# Patient Record
Sex: Female | Born: 1980
Health system: Southern US, Community
[De-identification: ages and names within clinical notes are randomized; demographics above are authoritative.]

## PROBLEM LIST (undated history)

## (undated) ENCOUNTER — Inpatient Hospital Stay (HOSPITAL_COMMUNITY): Payer: Self-pay

## (undated) DIAGNOSIS — O24419 Gestational diabetes mellitus in pregnancy, unspecified control: Secondary | ICD-10-CM

## (undated) DIAGNOSIS — Z803 Family history of malignant neoplasm of breast: Secondary | ICD-10-CM

## (undated) DIAGNOSIS — Z800 Family history of malignant neoplasm of digestive organs: Secondary | ICD-10-CM

## (undated) DIAGNOSIS — F32A Depression, unspecified: Secondary | ICD-10-CM

## (undated) DIAGNOSIS — Z808 Family history of malignant neoplasm of other organs or systems: Secondary | ICD-10-CM

## (undated) DIAGNOSIS — F419 Anxiety disorder, unspecified: Secondary | ICD-10-CM

## (undated) HISTORY — DX: Family history of malignant neoplasm of digestive organs: Z80.0

## (undated) HISTORY — PX: FOOT SURGERY: SHX648

## (undated) HISTORY — DX: Gestational diabetes mellitus in pregnancy, unspecified control: O24.419

## (undated) HISTORY — DX: Family history of malignant neoplasm of other organs or systems: Z80.8

## (undated) HISTORY — DX: Family history of malignant neoplasm of breast: Z80.3

---
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
## 2002-08-02 ENCOUNTER — Other Ambulatory Visit: Admission: RE | Admit: 2002-08-02 | Discharge: 2002-08-02 | Payer: Self-pay | Admitting: Family Medicine

## 2002-08-02 ENCOUNTER — Encounter: Admission: RE | Admit: 2002-08-02 | Discharge: 2002-08-02 | Payer: Self-pay | Admitting: Family Medicine

## 2002-08-02 ENCOUNTER — Encounter (INDEPENDENT_AMBULATORY_CARE_PROVIDER_SITE_OTHER): Payer: Self-pay

## 2002-09-03 ENCOUNTER — Encounter: Admission: RE | Admit: 2002-09-03 | Discharge: 2002-09-03 | Payer: Self-pay | Admitting: Obstetrics and Gynecology

## 2003-09-03 ENCOUNTER — Emergency Department (HOSPITAL_COMMUNITY): Admission: EM | Admit: 2003-09-03 | Discharge: 2003-09-03 | Payer: Self-pay | Admitting: Emergency Medicine

## 2004-03-14 DIAGNOSIS — Z87442 Personal history of urinary calculi: Secondary | ICD-10-CM

## 2004-03-14 HISTORY — DX: Personal history of urinary calculi: Z87.442

## 2006-07-05 ENCOUNTER — Other Ambulatory Visit: Admission: RE | Admit: 2006-07-05 | Discharge: 2006-07-05 | Payer: Self-pay | Admitting: Gynecology

## 2006-12-29 ENCOUNTER — Emergency Department (HOSPITAL_COMMUNITY): Admission: EM | Admit: 2006-12-29 | Discharge: 2006-12-29 | Payer: Self-pay | Admitting: Emergency Medicine

## 2008-03-19 ENCOUNTER — Emergency Department (HOSPITAL_COMMUNITY): Admission: EM | Admit: 2008-03-19 | Discharge: 2008-03-19 | Payer: Self-pay | Admitting: Emergency Medicine

## 2010-03-03 ENCOUNTER — Encounter (HOSPITAL_COMMUNITY)
Admission: RE | Admit: 2010-03-03 | Discharge: 2010-04-13 | Payer: Self-pay | Source: Home / Self Care | Attending: Podiatry | Admitting: Podiatry

## 2010-12-22 LAB — URINALYSIS, ROUTINE W REFLEX MICROSCOPIC
Bilirubin Urine: NEGATIVE
Glucose, UA: NEGATIVE
Ketones, ur: NEGATIVE
Nitrite: NEGATIVE
Protein, ur: NEGATIVE
Specific Gravity, Urine: 1.011
Urobilinogen, UA: 0.2
pH: 7.5

## 2010-12-22 LAB — URINE MICROSCOPIC-ADD ON

## 2010-12-22 LAB — PREGNANCY, URINE: Preg Test, Ur: NEGATIVE

## 2012-12-14 LAB — OB RESULTS CONSOLE ANTIBODY SCREEN: Antibody Screen: NEGATIVE

## 2012-12-14 LAB — OB RESULTS CONSOLE HEPATITIS B SURFACE ANTIGEN: Hepatitis B Surface Ag: NEGATIVE

## 2012-12-14 LAB — OB RESULTS CONSOLE HIV ANTIBODY (ROUTINE TESTING): HIV: NONREACTIVE

## 2012-12-14 LAB — OB RESULTS CONSOLE ABO/RH: RH Type: POSITIVE

## 2012-12-14 LAB — OB RESULTS CONSOLE RUBELLA ANTIBODY, IGM: Rubella: IMMUNE

## 2013-01-02 LAB — OB RESULTS CONSOLE GC/CHLAMYDIA
Chlamydia: NEGATIVE
Gonorrhea: NEGATIVE

## 2013-03-14 DIAGNOSIS — K219 Gastro-esophageal reflux disease without esophagitis: Secondary | ICD-10-CM

## 2013-03-14 HISTORY — DX: Gastro-esophageal reflux disease without esophagitis: K21.9

## 2013-03-14 NOTE — L&D Delivery Note (Signed)
Operative Delivery Note At 10:16 AM a viable and healthy female was delivered via Vaginal, Vacuum Neurosurgeon).  Presentation: vertex; Position: Right,, Occiput,, Anterior; Station: +3.  Verbal consent: obtained from family.  Risks and benefits discussed in detail.  Risks include, but are not limited to the risks of anesthesia, bleeding, infection, damage to maternal tissues, fetal cephalhematoma.  There is also the risk of inability to effect vaginal delivery of the head, or shoulder dystocia that cannot be resolved by established maneuvers, leading to the need for emergency cesarean section.  APGAR: 8, 9; weight pending Placenta status: Intact, Spontaneous.   Cord: 3 vessels with the following complications: None.  Cord pH: na  Anesthesia: Epidural  Instruments: kiwi x 3 pulls, 2 pop offs Episiotomy: None Lacerations: 2nd degree Suture Repair: 2.0 vicryl rapide Est. Blood Loss (mL): 300  Mom to postpartum.  Baby to Couplet care / Skin to Skin.  Amanda Burns J 06/08/2013, 10:31 AM

## 2013-03-28 LAB — OB RESULTS CONSOLE RPR: RPR: NONREACTIVE

## 2013-04-24 ENCOUNTER — Ambulatory Visit: Payer: Self-pay

## 2013-05-01 ENCOUNTER — Ambulatory Visit: Payer: Self-pay

## 2013-05-08 ENCOUNTER — Encounter: Payer: 59 | Attending: Obstetrics and Gynecology

## 2013-05-08 VITALS — Ht 61.0 in | Wt 180.0 lb

## 2013-05-08 DIAGNOSIS — O99810 Abnormal glucose complicating pregnancy: Secondary | ICD-10-CM | POA: Diagnosis present

## 2013-05-16 NOTE — Progress Notes (Addendum)
  Patient was seen on 05/08/13 for Gestational Diabetes self-management class at the [facility]. The following learning objectives were met by the patient during this course:   States the definition of Gestational Diabetes  States why dietary management is important in controlling blood glucose  Describes the effects of carbohydrates on blood glucose levels  Demonstrates ability to create a balanced meal plan  Demonstrates carbohydrate counting   States when to check blood glucose levels  Demonstrates proper blood glucose monitoring techniques  States the effect of stress and exercise on blood glucose levels  States the importance of limiting caffeine and abstaining from alcohol and smoking  Plan:  Aim for 2 Carb Choices per meal (30 grams) +/- 1 either way for breakfast Aim for 3 Carb Choices per meal (45 grams) +/- 1 either way from lunch and dinner Aim for 1-2 Carbs per snack Begin reading food labels for Total Carbohydrate and sugar grams of foods Consider  increasing your activity level by walking daily as tolerated Begin checking BG before breakfast and 1-2 hours after first bit of breakfast, lunch and dinner after  as directed by MD  Take medication  as directed by MD  Glucometer dispensed: Accu Ck Nano Lot: [id number] Exp: 04-13-14 Glucose: 36m/dl  Patient instructed to monitor glucose levels: FBS: 60 - <90 1 hour: <140 2 hour: <120  Patient received the following handouts:  Nutrition Diabetes and Pregnancy  Carbohydrate Counting List  Meal Planning worksheet  Patient will be seen for follow-up as needed.

## 2013-05-22 LAB — OB RESULTS CONSOLE GBS: GBS: NEGATIVE

## 2013-06-07 ENCOUNTER — Inpatient Hospital Stay (HOSPITAL_COMMUNITY): Payer: 59 | Admitting: Anesthesiology

## 2013-06-07 ENCOUNTER — Inpatient Hospital Stay (HOSPITAL_COMMUNITY)
Admission: AD | Admit: 2013-06-07 | Discharge: 2013-06-10 | DRG: 775 | Disposition: A | Discharge status: Home / Self Care | Payer: 59 | Source: Ambulatory Visit | Attending: Obstetrics and Gynecology | Admitting: Obstetrics and Gynecology

## 2013-06-07 ENCOUNTER — Encounter (HOSPITAL_COMMUNITY): Payer: 59 | Admitting: Anesthesiology

## 2013-06-07 ENCOUNTER — Encounter (HOSPITAL_COMMUNITY): Payer: Self-pay | Admitting: *Deleted

## 2013-06-07 DIAGNOSIS — K649 Unspecified hemorrhoids: Secondary | ICD-10-CM | POA: Diagnosis present

## 2013-06-07 DIAGNOSIS — Z3490 Encounter for supervision of normal pregnancy, unspecified, unspecified trimester: Secondary | ICD-10-CM

## 2013-06-07 DIAGNOSIS — O99810 Abnormal glucose complicating pregnancy: Secondary | ICD-10-CM | POA: Diagnosis present

## 2013-06-07 DIAGNOSIS — O99814 Abnormal glucose complicating childbirth: Secondary | ICD-10-CM | POA: Diagnosis present

## 2013-06-07 DIAGNOSIS — O878 Other venous complications in the puerperium: Secondary | ICD-10-CM | POA: Diagnosis present

## 2013-06-07 LAB — CBC
HCT: 33.3 % — ABNORMAL LOW (ref 36.0–46.0)
Hemoglobin: 11.4 g/dL — ABNORMAL LOW (ref 12.0–15.0)
MCH: 28.7 pg (ref 26.0–34.0)
MCHC: 34.2 g/dL (ref 30.0–36.0)
MCV: 83.9 fL (ref 78.0–100.0)
Platelets: 344 10*3/uL (ref 150–400)
RBC: 3.97 MIL/uL (ref 3.87–5.11)
RDW: 13.9 % (ref 11.5–15.5)
WBC: 17.2 10*3/uL — ABNORMAL HIGH (ref 4.0–10.5)

## 2013-06-07 LAB — TYPE AND SCREEN
ABO/RH(D): A POS
Antibody Screen: NEGATIVE

## 2013-06-07 LAB — GLUCOSE, CAPILLARY: Glucose-Capillary: 96 mg/dL (ref 70–99)

## 2013-06-07 MED ORDER — ONDANSETRON HCL 4 MG/2ML IJ SOLN: 4.0000 mg | Freq: Four times a day (QID) | INTRAMUSCULAR | Status: DC | PRN

## 2013-06-07 MED ORDER — LACTATED RINGERS IV SOLN
INTRAVENOUS | Status: DC
  Administered 2013-06-07: 22:00:00 via INTRAVENOUS

## 2013-06-07 MED ORDER — LIDOCAINE HCL (PF) 1 % IJ SOLN
30.0000 mL | INTRAMUSCULAR | Status: AC | PRN
  Administered 2013-06-08: 30 mL via SUBCUTANEOUS
  Filled 2013-06-07: qty 30

## 2013-06-07 MED ORDER — PHENYLEPHRINE 40 MCG/ML (10ML) SYRINGE FOR IV PUSH (FOR BLOOD PRESSURE SUPPORT)
PREFILLED_SYRINGE | INTRAVENOUS | Status: AC
  Filled 2013-06-07: qty 10

## 2013-06-07 MED ORDER — OXYCODONE-ACETAMINOPHEN 5-325 MG PO TABS: 1.0000 | ORAL_TABLET | ORAL | Status: DC | PRN

## 2013-06-07 MED ORDER — PHENYLEPHRINE 40 MCG/ML (10ML) SYRINGE FOR IV PUSH (FOR BLOOD PRESSURE SUPPORT)
80.0000 ug | PREFILLED_SYRINGE | INTRAVENOUS | Status: DC | PRN
  Administered 2013-06-07: 80 ug via INTRAVENOUS
  Filled 2013-06-07: qty 2

## 2013-06-07 MED ORDER — FENTANYL 2.5 MCG/ML BUPIVACAINE 1/10 % EPIDURAL INFUSION (WH - ANES)
14.0000 mL/h | INTRAMUSCULAR | Status: DC | PRN
  Administered 2013-06-08 (×2): 14 mL/h via EPIDURAL
  Filled 2013-06-07 (×2): qty 125

## 2013-06-07 MED ORDER — IBUPROFEN 600 MG PO TABS: 600.0000 mg | ORAL_TABLET | Freq: Four times a day (QID) | ORAL | Status: DC | PRN

## 2013-06-07 MED ORDER — ACETAMINOPHEN 325 MG PO TABS: 650.0000 mg | ORAL_TABLET | ORAL | Status: DC | PRN

## 2013-06-07 MED ORDER — FENTANYL 2.5 MCG/ML BUPIVACAINE 1/10 % EPIDURAL INFUSION (WH - ANES)
INTRAMUSCULAR | Status: AC
  Filled 2013-06-07: qty 125

## 2013-06-07 MED ORDER — OXYTOCIN 40 UNITS IN LACTATED RINGERS INFUSION - SIMPLE MED: 62.5000 mL/h | INTRAVENOUS | Status: DC

## 2013-06-07 MED ORDER — FLEET ENEMA 7-19 GM/118ML RE ENEM: 1.0000 | ENEMA | Freq: Once | RECTAL | Status: DC

## 2013-06-07 MED ORDER — EPHEDRINE 5 MG/ML INJ
10.0000 mg | INTRAVENOUS | Status: DC | PRN
  Filled 2013-06-07: qty 2

## 2013-06-07 MED ORDER — LIDOCAINE HCL (PF) 1 % IJ SOLN
INTRAMUSCULAR | Status: DC | PRN
  Administered 2013-06-07 (×2): 4 mL

## 2013-06-07 MED ORDER — OXYTOCIN BOLUS FROM INFUSION: 500.0000 mL | INTRAVENOUS | Status: DC

## 2013-06-07 MED ORDER — FENTANYL 2.5 MCG/ML BUPIVACAINE 1/10 % EPIDURAL INFUSION (WH - ANES)
INTRAMUSCULAR | Status: DC | PRN
  Administered 2013-06-07: 14 mL/h via EPIDURAL

## 2013-06-07 MED ORDER — DIPHENHYDRAMINE HCL 50 MG/ML IJ SOLN
12.5000 mg | INTRAMUSCULAR | Status: AC | PRN
  Administered 2013-06-08 (×3): 12.5 mg via INTRAVENOUS
  Filled 2013-06-07 (×2): qty 1

## 2013-06-07 MED ORDER — EPHEDRINE 5 MG/ML INJ
INTRAVENOUS | Status: AC
  Filled 2013-06-07: qty 4

## 2013-06-07 MED ORDER — LACTATED RINGERS IV SOLN: 500.0000 mL | Freq: Once | INTRAVENOUS | Status: DC

## 2013-06-07 MED ORDER — LACTATED RINGERS IV SOLN
500.0000 mL | INTRAVENOUS | Status: DC | PRN
Start: 2013-06-07 — End: 2013-06-08
  Administered 2013-06-08: 500 mL via INTRAVENOUS

## 2013-06-07 MED ORDER — CITRIC ACID-SODIUM CITRATE 334-500 MG/5ML PO SOLN: 30.0000 mL | ORAL | Status: DC | PRN

## 2013-06-07 NOTE — Anesthesia Procedure Notes (Signed)
Epidural Patient location during procedure: OB Start time: 06/07/2013 6:41 PM  Staffing Anesthesiologist: Jatasia Gundrum A. Performed by: anesthesiologist   Preanesthetic Checklist Completed: patient identified, site marked, surgical consent, pre-op evaluation, timeout performed, IV checked, risks and benefits discussed and monitors and equipment checked  Epidural Patient position: sitting Prep: site prepped and draped and DuraPrep Patient monitoring: continuous pulse ox and blood pressure Approach: midline Location: L3-L4 Injection technique: LOR air  Needle:  Needle type: Tuohy  Needle gauge: 17 G Needle length: 9 cm and 9 Needle insertion depth: 5 cm cm Catheter type: closed end flexible Catheter size: 19 Gauge Catheter at skin depth: 10 cm Test dose: negative and Other  Assessment Events: blood not aspirated, injection not painful, no injection resistance, negative IV test and no paresthesia  Additional Notes Patient identified. Risks and benefits discussed including failed block, incomplete  Pain control, post dural puncture headache, nerve damage, paralysis, blood pressure Changes, nausea, vomiting, reactions to medications-both toxic and allergic and post Partum back pain. All questions were answered. Patient expressed understanding and wished to proceed. Sterile technique was used throughout procedure. Epidural site was Dressed with sterile barrier dressing. No paresthesias, signs of intravascular injection Or signs of intrathecal spread were encountered.  Patient was more comfortable after the epidural was dosed. Please see RN's note for documentation of vital signs and FHR which are stable.

## 2013-06-07 NOTE — H&P (Signed)
Amanda Burns is a 33 y.o. female presenting for SROM at term. Maternal Medical History:  Reason for admission: Rupture of membranes.   Contractions: Onset was less than 1 hour ago.   Frequency: regular.   Perceived severity is moderate.    Fetal activity: Perceived fetal activity is normal.   Last perceived fetal movement was within the past hour.    Prenatal complications: no prenatal complications Prenatal Complications - Diabetes: gestational. Diabetes is managed by diet.      OB History   Grav Para Term Preterm Abortions TAB SAB Ect Mult Living   1              Past Medical History  Diagnosis Date  . Gestational diabetes mellitus, currently pregnant    Past Surgical History  Procedure Laterality Date  . Foot surgery     Family History: family history includes Cancer in her other. Social History:  reports that she has never smoked. She does not have any smokeless tobacco history on file. Her alcohol and drug histories are not on file.   Prenatal Transfer Tool  Maternal Diabetes: Yes:  Diabetes Type:  Diet controlled Genetic Screening: Normal Maternal Ultrasounds/Referrals: Normal Fetal Ultrasounds or other Referrals:  None Maternal Substance Abuse:  No Significant Maternal Medications:  None Significant Maternal Lab Results:  None Other Comments:  None  Review of Systems  Constitutional: Negative.   Eyes: Negative.   Cardiovascular: Negative.   Gastrointestinal: Negative.   Genitourinary: Negative.   Neurological: Negative.   Endo/Heme/Allergies: Negative.   Psychiatric/Behavioral: Negative.       Blood pressure 136/80, pulse 97, temperature 98 F (36.7 C), temperature source Oral, resp. rate 20, height 5\' 1"  (1.549 m), weight 85.276 kg (188 lb). Maternal Exam:  Uterine Assessment: Contraction strength is moderate.  Contraction frequency is regular.   Abdomen: Patient reports no abdominal tenderness. Fetal presentation: vertex  Introitus: Normal vulva.  Normal vagina.  Ferning test: positive.  Nitrazine test: positive. Amniotic fluid character: clear.  Pelvis: adequate for delivery.   Cervix: Cervix evaluated by digital exam.     Physical Exam  Constitutional: She is oriented to person, place, and time. She appears well-developed and well-nourished.  HENT:  Head: Normocephalic and atraumatic.  Eyes: Pupils are equal, round, and reactive to light.  Cardiovascular: Normal rate and regular rhythm.   Respiratory: Effort normal and breath sounds normal.  GI: Soft.  Genitourinary: Vagina normal.  Musculoskeletal: Normal range of motion.  Neurological: She is alert and oriented to person, place, and time.    Prenatal labs: ABO, Rh:   Antibody:   Rubella:   RPR:    HBsAg:    HIV:    GBS:     Assessment/Plan: SROM at term  GDM- diet controlled Admit  Epidural prn   Naseem Adler J 06/07/2013, 5:45 PM

## 2013-06-07 NOTE — Anesthesia Preprocedure Evaluation (Signed)
Anesthesia Evaluation  Patient identified by MRN, date of birth, ID band Patient awake    Reviewed: Allergy & Precautions, H&P , Patient's Chart, lab work & pertinent test results  Airway Mallampati: III TM Distance: >3 FB Neck ROM: Full    Dental no notable dental hx. (+) Teeth Intact   Pulmonary neg pulmonary ROS,  breath sounds clear to auscultation  Pulmonary exam normal       Cardiovascular negative cardio ROS  Rhythm:Regular Rate:Normal     Neuro/Psych negative neurological ROS  negative psych ROS   GI/Hepatic Neg liver ROS, GERD-  Medicated and Controlled,  Endo/Other  diabetes, Well Controlled, GestationalObesity  Renal/GU negative Renal ROS  negative genitourinary   Musculoskeletal negative musculoskeletal ROS (+)   Abdominal (+) + obese,   Peds  Hematology negative hematology ROS (+)   Anesthesia Other Findings   Reproductive/Obstetrics (+) Pregnancy                           Anesthesia Physical Anesthesia Plan  ASA: II  Anesthesia Plan: Epidural   Post-op Pain Management:    Induction:   Airway Management Planned: Natural Airway  Additional Equipment:   Intra-op Plan:   Post-operative Plan:   Informed Consent: I have reviewed the patients History and Physical, chart, labs and discussed the procedure including the risks, benefits and alternatives for the proposed anesthesia with the patient or authorized representative who has indicated his/her understanding and acceptance.     Plan Discussed with: Anesthesiologist  Anesthesia Plan Comments:         Anesthesia Quick Evaluation

## 2013-06-07 NOTE — Progress Notes (Signed)
Amanda Burns is a 33 y.o. G1P0 at [redacted]w[redacted]d by LMP admitted for rupture of membranes  Subjective: comfortable  Objective: BP 118/75  Pulse 87  Temp(Src) 98 F (36.7 C) (Oral)  Resp 18  Ht 5\' 1"  (1.549 m)  Wt 85.276 kg (188 lb)  BMI 35.54 kg/m2  SpO2 100%  LMP 09/19/2012      FHT:  FHR: 150 bpm, variability: moderate,  accelerations:  Present,  decelerations:  Absent UC:   regular, every 3 minutes SVE:   Dilation: 1 Effacement (%): 100 Station: -1;-2 Exam by:: S Grindstaff RN  Labs: Lab Results  Component Value Date   WBC 17.2* 06/07/2013   HGB 11.4* 06/07/2013   HCT 33.3* 06/07/2013   MCV 83.9 06/07/2013   PLT 344 06/07/2013    Assessment / Plan: Spontaneous labor, progressing normally Latent phase GDM  Labor: Progressing normally Preeclampsia:  no signs or symptoms of toxicity and intake and ouput balanced Fetal Wellbeing:  Category I Pain Control:  Epidural I/D:  n/a Anticipated MOD:  NSVD  Amanda Burns J 06/07/2013, 8:29 PM

## 2013-06-08 ENCOUNTER — Encounter (HOSPITAL_COMMUNITY): Payer: Self-pay | Admitting: *Deleted

## 2013-06-08 DIAGNOSIS — O99810 Abnormal glucose complicating pregnancy: Secondary | ICD-10-CM | POA: Diagnosis not present

## 2013-06-08 LAB — ABO/RH: ABO/RH(D): A POS

## 2013-06-08 LAB — GLUCOSE, CAPILLARY
Glucose-Capillary: 117 mg/dL — ABNORMAL HIGH (ref 70–99)
Glucose-Capillary: 97 mg/dL (ref 70–99)

## 2013-06-08 LAB — RPR: RPR Ser Ql: NONREACTIVE

## 2013-06-08 MED ORDER — BENZOCAINE-MENTHOL 20-0.5 % EX AERO
1.0000 "application " | INHALATION_SPRAY | CUTANEOUS | Status: DC | PRN
  Administered 2013-06-08: 1 via TOPICAL
  Filled 2013-06-08: qty 56

## 2013-06-08 MED ORDER — DIBUCAINE 1 % RE OINT
1.0000 "application " | TOPICAL_OINTMENT | RECTAL | Status: DC | PRN
  Administered 2013-06-09: 1 via RECTAL
  Filled 2013-06-08: qty 28

## 2013-06-08 MED ORDER — OXYTOCIN 40 UNITS IN LACTATED RINGERS INFUSION - SIMPLE MED
1.0000 m[IU]/min | INTRAVENOUS | Status: DC
  Administered 2013-06-08: 1 m[IU]/min via INTRAVENOUS
  Filled 2013-06-08: qty 1000

## 2013-06-08 MED ORDER — LANOLIN HYDROUS EX OINT: TOPICAL_OINTMENT | CUTANEOUS | Status: DC | PRN

## 2013-06-08 MED ORDER — METHYLERGONOVINE MALEATE 0.2 MG/ML IJ SOLN: 0.2000 mg | INTRAMUSCULAR | Status: DC | PRN

## 2013-06-08 MED ORDER — TETANUS-DIPHTH-ACELL PERTUSSIS 5-2.5-18.5 LF-MCG/0.5 IM SUSP: 0.5000 mL | Freq: Once | INTRAMUSCULAR | Status: DC

## 2013-06-08 MED ORDER — TERBUTALINE SULFATE 1 MG/ML IJ SOLN: 0.2500 mg | Freq: Once | INTRAMUSCULAR | Status: DC | PRN

## 2013-06-08 MED ORDER — ONDANSETRON HCL 4 MG PO TABS: 4.0000 mg | ORAL_TABLET | ORAL | Status: DC | PRN

## 2013-06-08 MED ORDER — OXYCODONE-ACETAMINOPHEN 5-325 MG PO TABS
1.0000 | ORAL_TABLET | ORAL | Status: DC | PRN
  Administered 2013-06-08 – 2013-06-10 (×4): 1 via ORAL
  Administered 2013-06-10: 2 via ORAL
  Administered 2013-06-10: 1 via ORAL
  Filled 2013-06-08 (×3): qty 1
  Filled 2013-06-08: qty 2
  Filled 2013-06-08 (×2): qty 1

## 2013-06-08 MED ORDER — METHYLERGONOVINE MALEATE 0.2 MG PO TABS: 0.2000 mg | ORAL_TABLET | ORAL | Status: DC | PRN

## 2013-06-08 MED ORDER — WITCH HAZEL-GLYCERIN EX PADS
1.0000 "application " | MEDICATED_PAD | CUTANEOUS | Status: DC | PRN
  Administered 2013-06-09: 1 via TOPICAL

## 2013-06-08 MED ORDER — PRENATAL MULTIVITAMIN CH
1.0000 | ORAL_TABLET | Freq: Every day | ORAL | Status: DC
  Administered 2013-06-08 – 2013-06-10 (×3): 1 via ORAL
  Filled 2013-06-08 (×3): qty 1

## 2013-06-08 MED ORDER — ONDANSETRON HCL 4 MG/2ML IJ SOLN: 4.0000 mg | INTRAMUSCULAR | Status: DC | PRN

## 2013-06-08 MED ORDER — DIPHENHYDRAMINE HCL 25 MG PO CAPS: 25.0000 mg | ORAL_CAPSULE | Freq: Four times a day (QID) | ORAL | Status: DC | PRN

## 2013-06-08 MED ORDER — IBUPROFEN 600 MG PO TABS
600.0000 mg | ORAL_TABLET | Freq: Four times a day (QID) | ORAL | Status: DC
  Administered 2013-06-08 – 2013-06-10 (×9): 600 mg via ORAL
  Filled 2013-06-08 (×10): qty 1

## 2013-06-08 MED ORDER — ZOLPIDEM TARTRATE 5 MG PO TABS: 5.0000 mg | ORAL_TABLET | Freq: Every evening | ORAL | Status: DC | PRN

## 2013-06-08 MED ORDER — SENNOSIDES-DOCUSATE SODIUM 8.6-50 MG PO TABS
2.0000 | ORAL_TABLET | ORAL | Status: DC
  Administered 2013-06-09 (×2): 2 via ORAL
  Filled 2013-06-08 (×2): qty 2

## 2013-06-08 MED ORDER — SIMETHICONE 80 MG PO CHEW
80.0000 mg | CHEWABLE_TABLET | ORAL | Status: DC | PRN
  Filled 2013-06-08: qty 1

## 2013-06-08 NOTE — Progress Notes (Signed)
Amanda Burns is a 33 y.o. G1P0 at [redacted]w[redacted]d by LMP admitted for rupture of membranes  Subjective: Pushing well  Objective: BP 83/56  Pulse 113  Temp(Src) 99.5 F (37.5 C) (Axillary)  Resp 20  Ht 5\' 1"  (1.549 m)  Wt 85.276 kg (188 lb)  BMI 35.54 kg/m2  SpO2 100%  LMP 09/19/2012 I/O last 3 completed shifts: In: -  Out: 500 [Urine:500]    FHT:  FHR: 160 bpm, variability: moderate,  accelerations:  Present,  decelerations:  Present variables with pushing UC:   regular, every 3 minutes SVE:   Dilation: 10 Effacement (%): 100 Station: +2 Exam by:: k fields, rn  Labs: Lab Results  Component Value Date   WBC 17.2* 06/07/2013   HGB 11.4* 06/07/2013   HCT 33.3* 06/07/2013   MCV 83.9 06/07/2013   PLT 344 06/07/2013    Assessment / Plan: Augmentation of labor, progressing well Second stage - pushing x one hour  Labor: Progressing normally Preeclampsia:  no signs or symptoms of toxicity and intake and ouput balanced Fetal Wellbeing:  Category I Pain Control:  Epidural I/D:  n/a Anticipated MOD:  NSVD  Amanda Burns J 06/08/2013, 8:55 AM

## 2013-06-08 NOTE — Lactation Note (Signed)
This note was copied from the chart of [facility]. Lactation Consultation Note  Patient Name: Boy Ireanna Finlayson MEQAS'T Date: 06/08/2013 Reason for consult: Initial assessment  RN asked LC to go see new patient who delivered this am @ 1016 who was having difficulty latching d/t large nipples and c/o right nipple soreness.  Upon entering room infant was in football hold on left side and mom stated he fed for 2-3 minutes and then went to sleep.  Asked mom if she wanted help latching on right side and let LC try to figure out why right side is so sore.  Mom attempted latching using cradle hold but infant was sucking on just the nipple.  LC switched mom's hands to cross-cradle hold and taught sandwiching of breast; assisted with getting some depth but infant kept thrusting nipple out to the tip and then suck on tip; mom was c/o pain.  LC suggested trying a nipple shield to see if it helps him maintain the latch and helps decrease soreness; mom agreed to trying it.  LC explained that if she did not like it she did not have to use it.  Nipple shield #24 started on right side and infant took a few sucks but came off crying; mom got frustrated and said she did not want to try anymore. LS-6. Suggested doing skin-to-skin and infant calmed but began rooting and crying again.  Suggested mom latch infant on left side in the football hold as she had done independently earlier since infant was showing feeding cues.  Mom independently latched infant with a semi-shallow latch but did not c/o pain; swallows heard; no assistance given by [facility] with latching on left side.  LC reviewed importance of depth and leading with chin to get depth for transferring milk, and reviewed early feeding cues.  Encouraged feeding with early cues.  Lactation brochure given and informed of community/ hospital support services, support groups, and outpatient services.  Encouraged mom to call RN for assistance as needed.  LC gave comfort gels to RN  for use during the night as needed for increasing soreness.     Maternal Data Formula Feeding for Exclusion: No Infant to breast within first hour of birth: Yes Has patient been taught Hand Expression?: Yes Does the patient have breastfeeding experience prior to this delivery?: No  Feeding Feeding Type: Breast Fed Length of feed: 5 min  LATCH Score/Interventions Latch: Repeated attempts needed to sustain latch, nipple held in mouth throughout feeding, stimulation needed to elicit sucking reflex. Intervention(s): Adjust position;Assist with latch;Breast compression  Audible Swallowing: A few with stimulation Intervention(s): Hand expression;Skin to skin  Type of Nipple: Everted at rest and after stimulation  Comfort (Breast/Nipple): Filling, red/small blisters or bruises, mild/mod discomfort  Problem noted: Mild/Moderate discomfort Interventions (Mild/moderate discomfort): Comfort gels (right very sore; left not sore; cgels given to RN for later use)  Hold (Positioning): Assistance needed to correctly position infant at breast and maintain latch.  LATCH Score: 6  Lactation Tools Discussed/Used Tools: Nipple Shields Nipple shield size: 24 WIC Program: Yes   Consult Status Consult Status: Follow-up Date: 06/09/13 Follow-up type: In-patient    Merlene Laughter 06/08/2013, 7:22 PM

## 2013-06-09 LAB — CBC
HCT: 30.4 % — ABNORMAL LOW (ref 36.0–46.0)
Hemoglobin: 10 g/dL — ABNORMAL LOW (ref 12.0–15.0)
MCH: 28.1 pg (ref 26.0–34.0)
MCHC: 32.9 g/dL (ref 30.0–36.0)
MCV: 85.4 fL (ref 78.0–100.0)
Platelets: 296 10*3/uL (ref 150–400)
RBC: 3.56 MIL/uL — ABNORMAL LOW (ref 3.87–5.11)
RDW: 14.6 % (ref 11.5–15.5)
WBC: 16.7 10*3/uL — ABNORMAL HIGH (ref 4.0–10.5)

## 2013-06-09 NOTE — Progress Notes (Signed)
Patient ID: Amanda Burns, female   DOB: [birth year], 33 y.o.   MRN: [id number] PPD # 1 SVD  S:  Reports feeling well             Tolerating po/ No nausea or vomiting             Bleeding is light             Pain controlled with ibuprofen (OTC)             Up ad lib / ambulatory / voiding without difficulties    Newborn  Information for the patient's newborn:  Mackinsey, Pelland [[id number]]  female  breast feeding  / Circumcision planning (Dr. Ronita Hipps to do later today)   O:  A & O x 3, in no apparent distress              VS:  Filed Vitals:   06/08/13 1355 06/08/13 1739 06/09/13 0001 06/09/13 0534  BP: 114/74 116/74 126/52 108/56  Pulse: 115 112 104 100  Temp: 98.7 F (37.1 C) 98.8 F (37.1 C) 98.1 F (36.7 C) 98.1 F (36.7 C)  TempSrc: Oral Oral Oral Oral  Resp: 20 18 20 18   Height:      Weight:      SpO2:   100% 100%    LABS:  Recent Labs  06/07/13 1745 06/09/13 0557  WBC 17.2* 16.7*  HGB 11.4* 10.0*  HCT 33.3* 30.4*  PLT 344 296    Blood type: --/--/A POS, A POS (03/27 1745)  Rubella: Immune (10/03 0000)   I&O: I/O last 3 completed shifts: In: -  Out: 950 [Urine:650; Blood:300]             Lungs: Clear and unlabored  Heart: regular rate and rhythm / no murmurs  Abdomen: soft, non-tender, non-distended              Fundus: firm, non-tender, U-1  Perineum: 2nd degree healing well, no edema  Lochia: minimal  Extremities: trace edema, no calf pain or tenderness, no Homans    A/P: PPD # 1  33 y.o., G1P1001   Principal Problem:   Postpartum care following vaginal delivery (3/28) Active Problems:   Pregnancy   Doing well - stable status  Routine post partum orders  Anticipate discharge tomorrow    Laury Deep, M, MSN, CNM 06/09/2013, 9:35 AM

## 2013-06-09 NOTE — Anesthesia Postprocedure Evaluation (Signed)
  Anesthesia Post-op Note  Patient: Amanda Burns  Procedure(s) Performed: * No procedures listed *  Patient Location: PACU and Mother/Baby  Anesthesia Type:Epidural  Level of Consciousness: awake, alert  and oriented  Airway and Oxygen Therapy: Patient Spontanous Breathing  Post-op Pain: none  Post-op Assessment: Post-op Vital signs reviewed, Patient's Cardiovascular Status Stable, No headache, No backache, No residual numbness and No residual motor weakness  Post-op Vital Signs: Reviewed and stable  Complications: No apparent anesthesia complications

## 2013-06-10 MED ORDER — DOCUSATE SODIUM 100 MG PO CAPS: 100.0000 mg | ORAL_CAPSULE | Freq: Two times a day (BID) | ORAL | Status: DC | PRN

## 2013-06-10 MED ORDER — PRAMOXINE HCL 1 % RE FOAM: 1.0000 "application " | Freq: Three times a day (TID) | RECTAL | Status: DC | PRN

## 2013-06-10 MED ORDER — OXYCODONE-ACETAMINOPHEN 5-325 MG PO TABS: 1.0000 | ORAL_TABLET | ORAL | Status: DC | PRN

## 2013-06-10 MED ORDER — IBUPROFEN 600 MG PO TABS: 600.0000 mg | ORAL_TABLET | Freq: Four times a day (QID) | ORAL | Status: DC | PRN

## 2013-06-10 NOTE — Progress Notes (Signed)
Assisted mother with latching of infant.  Mother tearful and stated "I think I probably will pump and put breast milk in a bottle because even though the baby is nursing, he is not staying on the breast". Small supplement of 1-2 ml given to infant and infant nursed for 20 minutes. Mother complained of discomfort during session stating "It probably hurts because he was not nursing well before". Mother given comfort gels, formula and handout regarding supplementation guide lines. Mother also instructed to call if assistance needed.

## 2013-06-10 NOTE — Discharge Summary (Addendum)
POSTOPERATIVE DISCHARGE SUMMARY:  Patient ID: Amanda Burns MRN: [id number] DOB/AGE: 09-27-1980 33 y.o.  Admit date: 06/07/2013 Admission Diagnoses: G1P0 at [redacted]w[redacted]d with SROM and GDM diet controlled in early labor   Date of Delivery: 06/08/2013  Discharge date:  06/10/2013 Discharge Diagnoses: G1P1 vaginal delivery with 2 degree laceration and vacuum extraction     Hemorrhoids  Prenatal history: G1P1001   EDC : 06/26/2013, by Last Menstrual Period  Has received prenatal care at [facility] since [redacted] wks gestation. Primary provider : Dr. Ronita Hipps  Prenatal course complicated by gestational diabetes mellitus- diet controlled   Prenatal Labs: ABO, Rh: --/--/A POS, A POS (03/27 1745)  Antibody: NEG (03/27 1745) Rubella: Immune (10/03 0000)  / MMR booster not indicated  RPR: NON REACTIVE (03/27 1745)  HBsAg: Negative (10/03 0000)  HIV: Non-reactive (10/03 0000)  GTT : 1 hour-154, 2 hour-181 GBS: Negative (03/11 0000)   Medical / Surgical History :  Past medical history:  Past Medical History  Diagnosis Date  . Gestational diabetes mellitus, currently pregnant   . Postpartum care following vaginal delivery (3/28) 06/08/2013    Past surgical history:  Past Surgical History  Procedure Laterality Date  . Foot surgery       Allergies: Hydrocodone   Intrapartum Course:  Admitted in early labor.  Progressed to 10cm/100% effaced.  Pushed x 1 hr, then Kiwi vacuum applied x 3 attempts with 2 pop offs.  Physical Exam:   VSS: Temp:  [98.2 F (36.8 C)-98.3 F (36.8 C)] 98.3 F (36.8 C) (03/30 0700) Pulse Rate:  [88-93] 93 (03/30 0700) Resp:  [18] 18 (03/30 0700) BP: (111-125)/(56-87) 111/56 mmHg (03/30 0700)  LABS:  Recent Labs  06/07/13 1745 06/09/13 0557  WBC 17.2* 16.7*  HGB 11.4* 10.0*  PLT 344 296     General: alert and cooperative Heart: RRR Lungs: clear  Abdomen: soft and non-tender / non-distended / active BS  Incision:  Well approximated  / no  erythema / no ecchymosis / no drainage Uterine Fundus: firm Lochia: appropriate Incision: healing well DVT Evaluation: No evidence of DVT seen on physical exam.   Newborn Information: Delivery of  Female  newborn by Dr Ronita Hipps  APGAR (1 MIN): 8   APGAR (5 MINS): 9  See operative report for further details   Discharge Instructions:  Discharged Condition: good  Activity: pelvic rest and postoperative restrictions x 2   Diet: routine and low carbohydrate   Medications:    Medication List    ASK your doctor about these medications       CORVITE 1.25 MG Tabs  Take 1 tablet by mouth at bedtime.     esomeprazole 40 MG capsule  Commonly known as:  NEXIUM  Take 40 mg by mouth daily at 12 noon.     ondansetron 8 MG tablet  Commonly known as:  ZOFRAN  Take 8 mg by mouth every 8 (eight) hours as needed for nausea or vomiting.     prenatal multivitamin Tabs tablet  Take 1 tablet by mouth daily at 12 noon.     promethazine 25 MG tablet  Commonly known as:  PHENERGAN  Take 25 mg by mouth at bedtime.        Postpartum Instructions: [facility] discharge booklet - instructions reviewed  Medications: Ibuprofen, Percocet, Proctofoam, PNV, and Colace  Discharge to: Home  Follow up :  [facility] in 6 weeks for routine postpartum visit with Dr Ronita Hipps.      Signed: Eura Mccauslin K  WHNP-BC, MSN 06/10/2013, 9:41 AM

## 2013-06-10 NOTE — Progress Notes (Signed)
Patient ID: Amanda Burns, female   DOB: 01-23-1981, 33 y.o.   MRN: [id number] PPD # 2  Subjective: Pt reports feeling well.  Tearful at present, d/t infant's need to remain in hospital d/t hyperbili.  Strong desire to go home today. Pain controlled with ibuprofen and percocet Tolerating po/ Voiding without problems/ No n/v Bleeding is light/ Newborn info:  Information for the patient's newborn:  Reagan, Behlke [[id number]]  female  / circ pending; infant to be placed on phototherapy to day/ Feeding: breast (pumping)    Objective:  VS: Blood pressure 111/56, pulse 93, temperature 98.3 F (36.8 C), temperature source Oral, resp. rate 18.    Recent Labs  06/07/13 1745 06/09/13 0557  WBC 17.2* 16.7*  HGB 11.4* 10.0*  HCT 33.3* 30.4*  PLT 344 296    Blood type: A POS Rubella: Immune    Physical Exam:  General:  alert, cooperative and no distress CV: Regular rate and rhythm Resp: clear Abdomen: soft, nontender, normal bowel sounds Uterine Fundus: firm, below umbilicus, nontender Perineum: healing with good reapproximation, minimal edema Lochia: minimal Ext: edema +1 to +2 pedal and pretib and Homans sign is negative, no sign of DVT  Rectum:  Small fleshy hemorrhoid, approx .5 cm in size   A/P: PPD # 2/ G1P1001/ S/P: VAVD with 2nd deg repair Tearful, but appropriate, d/t need to remain in hospital. Hemorrhoids Doing well and stable for discharge home RX: Ibuprofen 600mg  po Q 6 hrs prn pain #30 Refill x 1 Percocet 5/325 1 to 2 po Q 4 hrs prn pain #15 No refill Colace 100mg  po up to TID prn #30 Ref x 1 Proctofoam TID prn WOB/GYN booklet given Routine pp visit in 6wks   Darleen Crocker, MSN, [facility] 06/10/2013, 8:34 AM

## 2014-01-13 ENCOUNTER — Encounter (HOSPITAL_COMMUNITY): Payer: Self-pay | Admitting: *Deleted

## 2014-02-21 LAB — OB RESULTS CONSOLE HEPATITIS B SURFACE ANTIGEN: Hepatitis B Surface Ag: NEGATIVE

## 2014-02-21 LAB — OB RESULTS CONSOLE RPR: RPR: NONREACTIVE

## 2014-02-21 LAB — OB RESULTS CONSOLE RUBELLA ANTIBODY, IGM: Rubella: IMMUNE

## 2014-02-21 LAB — OB RESULTS CONSOLE ABO/RH: RH Type: POSITIVE

## 2014-02-21 LAB — OB RESULTS CONSOLE ANTIBODY SCREEN: Antibody Screen: NEGATIVE

## 2014-02-21 LAB — OB RESULTS CONSOLE HIV ANTIBODY (ROUTINE TESTING): HIV: NONREACTIVE

## 2014-03-14 NOTE — L&D Delivery Note (Signed)
Operative Delivery Note 34 y/o G2P1001 @ [redacted]w[redacted]d estimated gestational age (as dated by LMP c/w 20 week ultrasound) presents complaining of regular painful contractions since around 1am.   Pregnancy complicated by: 1) GERD- Omeprazole daily  Patient received an epidural for pain and was augmented with AROM (clear fluid). She progressed expectantly to complete dilation.  Due to maternal exhaustion, pt was consented for VAVD.  Verbal consent: obtained from patient.  Risks and benefits discussed in detail.  Risks include, but are not limited to the risks of anesthesia, bleeding, infection, damage to maternal tissues, fetal cephalhematoma.  There is also the risk of inability to effect vaginal delivery of the head, or shoulder dystocia that cannot be resolved by established maneuvers, leading to the need for emergency cesarean section.  At 2:19 PM a viable female was delivered via Vaginal, Vacuum Neurosurgeon).  Presentation: vertex; Position: Occiput,, Anterior; Station: +3.  Vacuum was applied, 2 pulls, no pop offs.  Upon delivery of fetal head, shoulder dystocia was noted.   Delivery of the head: 10/01/2014  2:19 PM First maneuver: 10/01/2014  2:19 PM, [place], Second maneuver: posterior arm delivered.  Dystocia lasted ~ 20sec.  APGAR: 8, 8; weight 7 lb 7.6 oz (3390 g).   Placenta status: Intact, Spontaneous.   Cord: 3 vessels with the following complications: None.    Anesthesia: Epidural  Episiotomy: None Lacerations: 2nd degree Suture Repair: 2.0 3.0 vicryl Est. Blood Loss (mL): 200  Mom to postpartum.  Baby to Couplet care / Skin to Skin.  Janyth Pupa, M 10/01/2014, 5:02 PM

## 2014-03-31 ENCOUNTER — Inpatient Hospital Stay (HOSPITAL_COMMUNITY)
Admission: AD | Admit: 2014-03-31 | Discharge: 2014-03-31 | Disposition: A | Discharge status: Home / Self Care | Payer: Medicaid Other | Source: Ambulatory Visit | Attending: Obstetrics & Gynecology | Admitting: Obstetrics & Gynecology

## 2014-03-31 ENCOUNTER — Encounter (HOSPITAL_COMMUNITY): Payer: Self-pay | Admitting: *Deleted

## 2014-03-31 DIAGNOSIS — O9989 Other specified diseases and conditions complicating pregnancy, childbirth and the puerperium: Secondary | ICD-10-CM | POA: Insufficient documentation

## 2014-03-31 DIAGNOSIS — M791 Myalgia: Secondary | ICD-10-CM

## 2014-03-31 DIAGNOSIS — Z3A12 12 weeks gestation of pregnancy: Secondary | ICD-10-CM | POA: Insufficient documentation

## 2014-03-31 DIAGNOSIS — K6289 Other specified diseases of anus and rectum: Secondary | ICD-10-CM | POA: Diagnosis not present

## 2014-03-31 DIAGNOSIS — M7918 Myalgia, other site: Secondary | ICD-10-CM

## 2014-03-31 HISTORY — DX: Gestational diabetes mellitus in pregnancy, unspecified control: O24.419

## 2014-03-31 LAB — URINALYSIS, ROUTINE W REFLEX MICROSCOPIC
Bilirubin Urine: NEGATIVE
Glucose, UA: NEGATIVE mg/dL
Hgb urine dipstick: NEGATIVE
Ketones, ur: NEGATIVE mg/dL
Nitrite: NEGATIVE
Protein, ur: NEGATIVE mg/dL
Specific Gravity, Urine: 1.02 (ref 1.005–1.030)
Urobilinogen, UA: 0.2 mg/dL (ref 0.0–1.0)
pH: 6 (ref 5.0–8.0)

## 2014-03-31 LAB — CBC
HCT: 35.1 % — ABNORMAL LOW (ref 36.0–46.0)
Hemoglobin: 11.7 g/dL — ABNORMAL LOW (ref 12.0–15.0)
MCH: 27.5 pg (ref 26.0–34.0)
MCHC: 33.3 g/dL (ref 30.0–36.0)
MCV: 82.4 fL (ref 78.0–100.0)
Platelets: 387 K/uL (ref 150–400)
RBC: 4.26 MIL/uL (ref 3.87–5.11)
RDW: 14 % (ref 11.5–15.5)
WBC: 12.9 K/uL — ABNORMAL HIGH (ref 4.0–10.5)

## 2014-03-31 LAB — URINE MICROSCOPIC-ADD ON

## 2014-03-31 MED ORDER — TRAMADOL HCL 50 MG PO TABS: 50.0000 mg | ORAL_TABLET | Freq: Four times a day (QID) | ORAL | Status: DC | PRN

## 2014-03-31 NOTE — MAU Provider Note (Signed)
History     CSN: [id number]  Arrival date and time: 03/31/14 1134   None     Chief Complaint  Patient presents with  . Rectal Pain   HPI   [name] is a 34 y.o. female G2P1001 at [redacted]w[redacted]d who presents with rectal pain. The pain started on Thursday and has progressively gotten worsen. She was unable to sleep last night because she was unable to get comfortable while laying down. The patient states that she has been unable to sit or lay down for most of the morning due to the discomfort.  + history of Hemorrhoids  Denies constipation   The pain is worse on her left side of her gluteal, however it is painful on both sides. The pain feels deep and does not hurt when she touches her rectum.     RN note: Having pain in the middle of her bottom, started on Thurs. Hurts to lay on side, back, sit. Could hardly sleep last night due to pain.  OB History    Gravida Para Term Preterm AB TAB SAB Ectopic Multiple Living   2 1 1       1       Past Medical History  Diagnosis Date  . Gestational diabetes mellitus, currently pregnant   . Postpartum care following vaginal delivery (3/28) 06/08/2013  . Gestational diabetes     Past Surgical History  Procedure Laterality Date  . Foot surgery      Family History  Problem Relation Age of Onset  . Cancer Other     History  Substance Use Topics  . Smoking status: Never Smoker   . Smokeless tobacco: Not on file  . Alcohol Use: No    Allergies:  Allergies  Allergen Reactions  . Hydrocodone Itching    Prescriptions prior to admission  Medication Sig Dispense Refill Last Dose  . acetaminophen (TYLENOL) 500 MG tablet Take 1,000 mg by mouth every 6 (six) hours as needed.   03/31/2014 at [facility]  . omeprazole (PRILOSEC) 40 MG capsule Take 40 mg by mouth daily.   03/31/2014 at Unknown time  . Prenatal Vit-Fe Fumarate-FA (PRENATAL MULTIVITAMIN) TABS tablet Take 1 tablet by mouth daily at 12 noon.   03/31/2014 at Unknown time   Results  for orders placed or performed during the hospital encounter of 03/31/14 (from the past 48 hour(s))  Urinalysis, Routine w reflex microscopic     Status: Abnormal   Collection Time: 03/31/14 12:05 PM  Result Value Ref Range   Color, Urine YELLOW YELLOW   APPearance CLEAR CLEAR   Specific Gravity, Urine 1.020 1.005 - 1.030   pH 6.0 5.0 - 8.0   Glucose, UA NEGATIVE NEGATIVE mg/dL   Hgb urine dipstick NEGATIVE NEGATIVE   Bilirubin Urine NEGATIVE NEGATIVE   Ketones, ur NEGATIVE NEGATIVE mg/dL   Protein, ur NEGATIVE NEGATIVE mg/dL   Urobilinogen, UA 0.2 0.0 - 1.0 mg/dL   Nitrite NEGATIVE NEGATIVE   Leukocytes, UA TRACE (A) NEGATIVE  Urine microscopic-add on     Status: Abnormal   Collection Time: 03/31/14 12:05 PM  Result Value Ref Range   Squamous Epithelial / LPF FEW (A) RARE   WBC, UA 0-2 <3 WBC/hpf  CBC     Status: Abnormal   Collection Time: 03/31/14  1:10 PM  Result Value Ref Range   WBC 12.9 (H) 4.0 - 10.5 K/uL   RBC 4.26 3.87 - 5.11 MIL/uL   Hemoglobin 11.7 (L) 12.0 - 15.0 g/dL  HCT 35.1 (L) 36.0 - 46.0 %   MCV 82.4 78.0 - 100.0 fL   MCH 27.5 26.0 - 34.0 pg   MCHC 33.3 30.0 - 36.0 g/dL   RDW 14.0 11.5 - 15.5 %   Platelets 387 150 - 400 K/uL    Review of Systems  Constitutional: Negative for fever and chills.  Gastrointestinal: Negative for nausea, vomiting, abdominal pain, diarrhea and constipation.  Genitourinary:       + rectal pain    Physical Exam   Blood pressure 122/74, pulse 110, temperature 99.5 F (37.5 C), temperature source Oral, resp. rate 18, weight 78.019 kg (172 lb), last menstrual period 09/19/2012, unknown if currently breastfeeding.  Physical Exam  Constitutional: She is oriented to person, place, and time. She appears well-developed and well-nourished. She appears distressed.  HENT:  Head: Normocephalic.  Eyes: Pupils are equal, round, and reactive to light.  Genitourinary: Rectum normal. Rectal exam shows no external hemorrhoid, no internal  hemorrhoid, no fissure, no mass, no tenderness and anal tone normal.    Pelvic exam was performed with patient prone.  Neurological: She is alert and oriented to person, place, and time.  Skin: Skin is warm. She is not diaphoretic.  Psychiatric: Her behavior is normal.    MAU Course  Procedures  None  MDM CBC  +fht  Spoke to Dr. Nelda Marseille at 1418; ok to discharge the patient home with Tramadol and treat as a possible muscle spasm. Patient will keep her appointment that is scheduled tomorrow am.   Assessment and Plan   A:  1. Gluteal pain     P:  Discharge home in stable condition Follow up with OB tomorrow as scheduled Return to MAU as needed, if symptoms worsen RX: Tramadol  Warm compresses to affected area for comfort.   Darrelyn Hillock Ludwig Tugwell, NP 04/02/2014 8:42 AM

## 2014-03-31 NOTE — MAU Note (Signed)
Having pain in the middle of her bottom, started on Thurs.  Hurts to lay on side, back, sit.  Could hardly sleep last night due to pain.

## 2014-04-01 ENCOUNTER — Other Ambulatory Visit (HOSPITAL_COMMUNITY)
Admission: RE | Admit: 2014-04-01 | Discharge: 2014-04-01 | Disposition: A | Discharge status: Home / Self Care | Payer: Medicaid Other | Source: Ambulatory Visit | Attending: Obstetrics & Gynecology | Admitting: Obstetrics & Gynecology

## 2014-04-01 ENCOUNTER — Other Ambulatory Visit: Payer: Self-pay | Admitting: Obstetrics & Gynecology

## 2014-04-01 DIAGNOSIS — Z113 Encounter for screening for infections with a predominantly sexual mode of transmission: Secondary | ICD-10-CM | POA: Insufficient documentation

## 2014-04-01 DIAGNOSIS — Z01419 Encounter for gynecological examination (general) (routine) without abnormal findings: Secondary | ICD-10-CM | POA: Diagnosis present

## 2014-04-01 DIAGNOSIS — Z1151 Encounter for screening for human papillomavirus (HPV): Secondary | ICD-10-CM | POA: Diagnosis present

## 2014-04-02 LAB — CYTOLOGY - PAP

## 2014-10-01 ENCOUNTER — Inpatient Hospital Stay (HOSPITAL_COMMUNITY): Payer: Medicaid Other | Admitting: Anesthesiology

## 2014-10-01 ENCOUNTER — Inpatient Hospital Stay (HOSPITAL_COMMUNITY)
Admission: AD | Admit: 2014-10-01 | Discharge: 2014-10-03 | DRG: 775 | Disposition: A | Discharge status: Home / Self Care | Payer: Medicaid Other | Source: Ambulatory Visit | Attending: Obstetrics & Gynecology | Admitting: Obstetrics & Gynecology

## 2014-10-01 ENCOUNTER — Encounter (HOSPITAL_COMMUNITY): Payer: Self-pay | Admitting: *Deleted

## 2014-10-01 DIAGNOSIS — Z3A39 39 weeks gestation of pregnancy: Secondary | ICD-10-CM | POA: Diagnosis present

## 2014-10-01 DIAGNOSIS — O9962 Diseases of the digestive system complicating childbirth: Secondary | ICD-10-CM | POA: Diagnosis present

## 2014-10-01 DIAGNOSIS — K219 Gastro-esophageal reflux disease without esophagitis: Secondary | ICD-10-CM | POA: Diagnosis present

## 2014-10-01 LAB — TYPE AND SCREEN
ABO/RH(D): A POS
Antibody Screen: NEGATIVE

## 2014-10-01 LAB — CBC
HCT: 32.4 % — ABNORMAL LOW (ref 36.0–46.0)
Hemoglobin: 10.8 g/dL — ABNORMAL LOW (ref 12.0–15.0)
MCH: 27.6 pg (ref 26.0–34.0)
MCHC: 33.3 g/dL (ref 30.0–36.0)
MCV: 82.7 fL (ref 78.0–100.0)
Platelets: 291 10*3/uL (ref 150–400)
RBC: 3.92 MIL/uL (ref 3.87–5.11)
RDW: 14.3 % (ref 11.5–15.5)
WBC: 15 10*3/uL — ABNORMAL HIGH (ref 4.0–10.5)

## 2014-10-01 LAB — RPR: RPR Ser Ql: NONREACTIVE

## 2014-10-01 MED ORDER — ONDANSETRON HCL 4 MG PO TABS: 4.0000 mg | ORAL_TABLET | ORAL | Status: DC | PRN

## 2014-10-01 MED ORDER — DIBUCAINE 1 % RE OINT: 1.0000 "application " | TOPICAL_OINTMENT | RECTAL | Status: DC | PRN

## 2014-10-01 MED ORDER — IBUPROFEN 600 MG PO TABS
600.0000 mg | ORAL_TABLET | Freq: Four times a day (QID) | ORAL | Status: DC
  Administered 2014-10-01 – 2014-10-03 (×8): 600 mg via ORAL
  Filled 2014-10-01 (×9): qty 1

## 2014-10-01 MED ORDER — FLEET ENEMA 7-19 GM/118ML RE ENEM: 1.0000 | ENEMA | RECTAL | Status: DC | PRN

## 2014-10-01 MED ORDER — ONDANSETRON HCL 4 MG/2ML IJ SOLN
4.0000 mg | Freq: Four times a day (QID) | INTRAMUSCULAR | Status: DC | PRN
  Administered 2014-10-01: 4 mg via INTRAVENOUS
  Filled 2014-10-01: qty 2

## 2014-10-01 MED ORDER — OXYTOCIN BOLUS FROM INFUSION
500.0000 mL | INTRAVENOUS | Status: DC
  Administered 2014-10-01: 500 mL via INTRAVENOUS

## 2014-10-01 MED ORDER — TERBUTALINE SULFATE 1 MG/ML IJ SOLN: 0.2500 mg | Freq: Once | INTRAMUSCULAR | Status: DC | PRN

## 2014-10-01 MED ORDER — OXYCODONE-ACETAMINOPHEN 5-325 MG PO TABS: 2.0000 | ORAL_TABLET | ORAL | Status: DC | PRN

## 2014-10-01 MED ORDER — PRENATAL MULTIVITAMIN CH
1.0000 | ORAL_TABLET | Freq: Every day | ORAL | Status: DC
  Administered 2014-10-02 – 2014-10-03 (×2): 1 via ORAL
  Filled 2014-10-01 (×2): qty 1

## 2014-10-01 MED ORDER — EPHEDRINE 5 MG/ML INJ: 10.0000 mg | INTRAVENOUS | Status: DC | PRN

## 2014-10-01 MED ORDER — DIPHENHYDRAMINE HCL 25 MG PO CAPS: 25.0000 mg | ORAL_CAPSULE | Freq: Four times a day (QID) | ORAL | Status: DC | PRN

## 2014-10-01 MED ORDER — PHENYLEPHRINE 40 MCG/ML (10ML) SYRINGE FOR IV PUSH (FOR BLOOD PRESSURE SUPPORT)
80.0000 ug | PREFILLED_SYRINGE | INTRAVENOUS | Status: DC | PRN
  Filled 2014-10-01: qty 20

## 2014-10-01 MED ORDER — OXYTOCIN 40 UNITS IN LACTATED RINGERS INFUSION - SIMPLE MED
62.5000 mL/h | INTRAVENOUS | Status: DC
  Filled 2014-10-01: qty 1000

## 2014-10-01 MED ORDER — CITRIC ACID-SODIUM CITRATE 334-500 MG/5ML PO SOLN
30.0000 mL | ORAL | Status: DC | PRN
  Administered 2014-10-01: 30 mL via ORAL
  Filled 2014-10-01: qty 15

## 2014-10-01 MED ORDER — SENNOSIDES-DOCUSATE SODIUM 8.6-50 MG PO TABS
2.0000 | ORAL_TABLET | ORAL | Status: DC
  Administered 2014-10-01 – 2014-10-03 (×2): 2 via ORAL
  Filled 2014-10-01 (×2): qty 2

## 2014-10-01 MED ORDER — ACETAMINOPHEN 325 MG PO TABS
650.0000 mg | ORAL_TABLET | ORAL | Status: DC | PRN
  Administered 2014-10-02: 650 mg via ORAL
  Filled 2014-10-01: qty 2

## 2014-10-01 MED ORDER — DIPHENHYDRAMINE HCL 50 MG/ML IJ SOLN: 12.5000 mg | INTRAMUSCULAR | Status: DC | PRN

## 2014-10-01 MED ORDER — ACETAMINOPHEN 325 MG PO TABS
650.0000 mg | ORAL_TABLET | ORAL | Status: DC | PRN
  Administered 2014-10-01: 650 mg via ORAL
  Filled 2014-10-01: qty 2

## 2014-10-01 MED ORDER — WITCH HAZEL-GLYCERIN EX PADS: 1.0000 "application " | MEDICATED_PAD | CUTANEOUS | Status: DC | PRN

## 2014-10-01 MED ORDER — LIDOCAINE HCL (PF) 1 % IJ SOLN
30.0000 mL | INTRAMUSCULAR | Status: AC | PRN
  Administered 2014-10-01: 30 mL via SUBCUTANEOUS
  Filled 2014-10-01: qty 30

## 2014-10-01 MED ORDER — OXYTOCIN 40 UNITS IN LACTATED RINGERS INFUSION - SIMPLE MED
1.0000 m[IU]/min | INTRAVENOUS | Status: DC
  Administered 2014-10-01: 2 m[IU]/min via INTRAVENOUS

## 2014-10-01 MED ORDER — ZOLPIDEM TARTRATE 5 MG PO TABS: 5.0000 mg | ORAL_TABLET | Freq: Every evening | ORAL | Status: DC | PRN

## 2014-10-01 MED ORDER — FENTANYL 2.5 MCG/ML BUPIVACAINE 1/10 % EPIDURAL INFUSION (WH - ANES)
14.0000 mL/h | INTRAMUSCULAR | Status: DC | PRN
  Administered 2014-10-01: 14 mL/h via EPIDURAL
  Filled 2014-10-01 (×2): qty 125

## 2014-10-01 MED ORDER — ONDANSETRON HCL 4 MG/2ML IJ SOLN: 4.0000 mg | INTRAMUSCULAR | Status: DC | PRN

## 2014-10-01 MED ORDER — PANTOPRAZOLE SODIUM 40 MG PO TBEC: 40.0000 mg | DELAYED_RELEASE_TABLET | Freq: Two times a day (BID) | ORAL | Status: DC | PRN

## 2014-10-01 MED ORDER — LANOLIN HYDROUS EX OINT: TOPICAL_OINTMENT | CUTANEOUS | Status: DC | PRN

## 2014-10-01 MED ORDER — BENZOCAINE-MENTHOL 20-0.5 % EX AERO
1.0000 "application " | INHALATION_SPRAY | CUTANEOUS | Status: DC | PRN
  Administered 2014-10-01 – 2014-10-03 (×2): 1 via TOPICAL
  Filled 2014-10-01 (×2): qty 56

## 2014-10-01 MED ORDER — LACTATED RINGERS IV SOLN: 500.0000 mL | INTRAVENOUS | Status: DC | PRN

## 2014-10-01 MED ORDER — LACTATED RINGERS IV SOLN
INTRAVENOUS | Status: DC
  Administered 2014-10-01: 09:00:00 via INTRAVENOUS

## 2014-10-01 MED ORDER — SIMETHICONE 80 MG PO CHEW: 80.0000 mg | CHEWABLE_TABLET | ORAL | Status: DC | PRN

## 2014-10-01 MED ORDER — OXYCODONE-ACETAMINOPHEN 5-325 MG PO TABS: 1.0000 | ORAL_TABLET | ORAL | Status: DC | PRN

## 2014-10-01 NOTE — H&P (Signed)
HPI: 34 y/o G2P1001 @ [redacted]w[redacted]d estimated gestational age (as dated by LMP c/w 20 week ultrasound) presents complaining of regular painful contractions since around 1am.   no Leaking of Fluid,   no Vaginal Bleeding,   + Uterine Contractions,  + Fetal Movement.  ROS: no HA, no epigastric pain, no visual changes.    Pregnancy complicated by: 1) GERD- Omeprazole daily   Prenatal Transfer Tool  Maternal Diabetes: No Genetic Screening: Declined Maternal Ultrasounds/Referrals: Normal Fetal Ultrasounds or other Referrals:  None Maternal Substance Abuse:  No Significant Maternal Medications:  Meds include: Other: Omeprazole Significant Maternal Lab Results: Lab values include: Group B Strep negative   PNL:  GBS negative, Rub Immune, Hep B neg, RPR NR, HIV neg, GC/C neg, glucola:131 Hgb: 11.2 Blood type: A positive, antibody negative  Immunizations: Tdap given (06/20/14)  OBHx: 2015- VAVD, 7#7AJ, female, complicated by GDMA1 PMHx:  GERD Meds:  PNV, Omeprazole Allergy:   Allergies  Allergen Reactions  . Hydrocodone Itching   SurgHx: none SocHx:   no Tobacco, no  EtOH, no Illicit Drugs  O: BP 287/86 mmHg  Pulse 108  Temp(Src) 98.3 F (36.8 C) (Oral)  Resp 18  Ht 5\' 1"  (1.549 m)  Wt 80.287 kg (177 lb)  BMI 33.46 kg/m2  SpO2 100% Gen. AAOx3, NAD Abd. Gravid,  no tenderness Extr.  no edema B/L , no calf tenderness bilaterally  FHT: 135 baseline, moderate variability, + accels,  no decels Toco: q 3-5 min SVE: 4/100/-3, per CNM   Labs:  CBC Latest Ref Rng 10/01/2014 03/31/2014 06/09/2013  WBC 4.0 - 10.5 K/uL 15.0(H) 12.9(H) 16.7(H)  Hemoglobin 12.0 - 15.0 g/dL 10.8(L) 11.7(L) 10.0(L)  Hematocrit 36.0 - 46.0 % 32.4(L) 35.1(L) 30.4(L)  Platelets 150 - 400 K/uL 291 387 296    A/P:  34 y.o. G2P1001 @ [redacted]w[redacted]d EGA who presents transitioning to active labor -FWB:  NICHD Cat I FHTs -Labor: continue expectant management -GBS: negative -Pain management: plan for epidural  Janyth Pupa,  DO [phone] (pager) [phone] (office)

## 2014-10-01 NOTE — Anesthesia Preprocedure Evaluation (Signed)
Anesthesia Evaluation  Patient identified by MRN, date of birth, ID band Patient awake    Reviewed: Allergy & Precautions, H&P , Patient's Chart, lab work & pertinent test results  Airway Mallampati: II  TM Distance: >3 FB Neck ROM: full    Dental no notable dental hx.    Pulmonary neg pulmonary ROS,    Pulmonary exam normal       Cardiovascular negative cardio ROS Normal cardiovascular exam    Neuro/Psych negative neurological ROS  negative psych ROS   GI/Hepatic negative GI ROS, Neg liver ROS,   Endo/Other  diabetes  Renal/GU negative Renal ROS     Musculoskeletal   Abdominal Normal abdominal exam  (+)   Peds  Hematology negative hematology ROS (+)   Anesthesia Other Findings   Reproductive/Obstetrics (+) Pregnancy                             Anesthesia Physical Anesthesia Plan  ASA: II  Anesthesia Plan: Epidural   Post-op Pain Management:    Induction:   Airway Management Planned:   Additional Equipment:   Intra-op Plan:   Post-operative Plan:   Informed Consent: I have reviewed the patients History and Physical, chart, labs and discussed the procedure including the risks, benefits and alternatives for the proposed anesthesia with the patient or authorized representative who has indicated his/her understanding and acceptance.     Plan Discussed with:   Anesthesia Plan Comments:         Anesthesia Quick Evaluation

## 2014-10-01 NOTE — Progress Notes (Signed)
Epidural catheter removed and intact   

## 2014-10-01 NOTE — Progress Notes (Signed)
OB PN:  S: Pt resting comfortably with epidural, no acute complaints  O: BP 132/81 mmHg  Pulse 113  Temp(Src) 98.3 F (36.8 C) (Oral)  Resp 18  Ht 5\' 1"  (1.549 m)  Wt 80.287 kg (177 lb)  BMI 33.46 kg/m2  SpO2 99%  FHT: 140bpm, moderate variablity, + accels, no decels Toco: q57min SVE: 6/100/-2, AROM clear fluid  A/P: 34 y.o. G2P1001 @ [redacted]w[redacted]d in active labor 1. FWB: Cat. I 2. Labor: continue with expectant management Pain: continue epidural GBS: negative  Amanda Pupa, DO [phone] (pager) [phone] (office)

## 2014-10-01 NOTE — MAU Note (Signed)
Ctx q 4-5 min, Denies LOF& VB. +FM. GBS-, Pain7/10

## 2014-10-01 NOTE — Progress Notes (Signed)
Epidural catheter may be removed for anesthesia, Dr Glennon Mac.

## 2014-10-02 ENCOUNTER — Telehealth: Payer: Self-pay | Admitting: Obstetrics and Gynecology

## 2014-10-02 LAB — CBC
HCT: 30.6 % — ABNORMAL LOW (ref 36.0–46.0)
Hemoglobin: 10.1 g/dL — ABNORMAL LOW (ref 12.0–15.0)
MCH: 27.5 pg (ref 26.0–34.0)
MCHC: 33 g/dL (ref 30.0–36.0)
MCV: 83.4 fL (ref 78.0–100.0)
Platelets: 261 10*3/uL (ref 150–400)
RBC: 3.67 MIL/uL — ABNORMAL LOW (ref 3.87–5.11)
RDW: 14.4 % (ref 11.5–15.5)
WBC: 11.2 10*3/uL — ABNORMAL HIGH (ref 4.0–10.5)

## 2014-10-02 LAB — CCBB MATERNAL DONOR DRAW

## 2014-10-02 MED ORDER — MENTHOL 3 MG MT LOZG
1.0000 | LOZENGE | OROMUCOSAL | Status: DC | PRN
  Administered 2014-10-02: 3 mg via ORAL
  Filled 2014-10-02: qty 9

## 2014-10-02 NOTE — Progress Notes (Signed)
Postpartum Note Day # 1  S:  Patient resting comfortable in bed.  Pain controlled.  Tolerating general. + flatus, no BM.  Lochia moderate.  Ambulating without difficulty.  She denies n/v/f/c, SOB, or CP.   O: Temp:  [97.9 F (36.6 C)-99.4 F (37.4 C)] 97.9 F (36.6 C) (07/21 0550) Pulse Rate:  [68-153] 79 (07/21 0550) Resp:  [16-20] 18 (07/21 0550) BP: (101-148)/(50-103) 121/53 mmHg (07/21 0550) SpO2:  [96 %-100 %] 96 % (07/20 1630) Gen: A&Ox3, NAD CV: RRR, no MRG Resp: CTAB Abdomen: soft, NT, ND Uterus: firm, non-tender, below umbilicus Ext: No edema, no calf tenderness bilaterally Labs:  Recent Labs  10/01/14 0550 10/02/14 0535  HGB 10.8* 10.1*    A/P: Pt is a 34 y.o. [id number] s/p VAVD complicated by shoulder dystocia, PPD#1  - Pain well controlled -GU: Voiding freely -GI: Tolerating general diet -Activity: encouraged sitting up to chair and ambulation as tolerated -Prophylaxis: early ambulation -Labs: stable as above -Pt considering inpatient circ today  Janyth Pupa, DO [phone] (pager) [phone] (office)

## 2014-10-02 NOTE — Telephone Encounter (Signed)
Addendum to previous telephone encounter -- pt called at 12:40 AM on 10/01/14.

## 2014-10-02 NOTE — Anesthesia Postprocedure Evaluation (Signed)
  Anesthesia Post-op Note  Patient: Amanda Burns  Procedure(s) Performed: * No procedures listed *  Patient Location: Mother/Baby  Anesthesia Type:Epidural  Level of Consciousness: awake, alert , oriented and patient cooperative  Airway and Oxygen Therapy: Patient Spontanous Breathing  Post-op Pain: none  Post-op Assessment: Post-op Vital signs reviewed, Patient's Cardiovascular Status Stable, Respiratory Function Stable, Patent Airway, No headache, No backache and Patient able to bend at knees              Post-op Vital Signs: Reviewed and stable  Last Vitals:  Filed Vitals:   10/02/14 0550  BP: 121/53  Pulse: 79  Temp: 36.6 C  Resp: 18    Complications: No apparent anesthesia complications

## 2014-10-02 NOTE — Lactation Note (Signed)
This note was copied from the chart of [place]. Lactation Consultation Note; Experienced BF mom reports baby has been latching well- some tenderness noted. Has given some formula- plans to do both breast and formula- has 31 mon old at home and did that with him. No questions at present. BF brochure given with resources for support after DC. TO call prn  Patient Name: Amanda Burns LOVFI'E Date: 10/02/2014 Reason for consult: Initial assessment   Maternal Data Formula Feeding for Exclusion: No Does the patient have breastfeeding experience prior to this delivery?: Yes  Feeding   LATCH Score/Interventions                      Lactation Tools Discussed/Used     Consult Status Consult Status: PRN    Truddie Crumble 10/02/2014, 12:18 PM

## 2014-10-02 NOTE — Telephone Encounter (Signed)
TC from pt at 12:40 AM. G2P1 @ 39 wks - ctxs q 6-7 min since earlier in the evening, now 4-5 min over past hr. +FM. Denies leaking or bleeding. Advised to come to MAU to be evaluated.

## 2014-10-02 NOTE — Discharge Instructions (Signed)

## 2014-10-02 NOTE — Progress Notes (Signed)
UR chart review completed.  

## 2014-10-03 MED ORDER — LIDOCAINE HCL (PF) 1 % IJ SOLN
INTRAMUSCULAR | Status: DC | PRN
  Administered 2014-10-01 (×2): 8 mL

## 2014-10-03 MED ORDER — FENTANYL 2.5 MCG/ML BUPIVACAINE 1/10 % EPIDURAL INFUSION (WH - ANES)
INTRAMUSCULAR | Status: DC | PRN
  Administered 2014-10-01: 14 mL/h via EPIDURAL

## 2014-10-03 NOTE — Anesthesia Procedure Notes (Signed)
Epidural Patient location during procedure: OB Start time: 10/03/2014 7:10 AM End time: 10/03/2014 7:14 AM  Staffing Anesthesiologist: Lyn Hollingshead Performed by: anesthesiologist   Preanesthetic Checklist Completed: patient identified, surgical consent, pre-op evaluation, timeout performed, IV checked, risks and benefits discussed and monitors and equipment checked  Epidural Patient position: sitting Prep: site prepped and draped and DuraPrep Patient monitoring: continuous pulse ox and blood pressure Approach: midline Location: L3-L4 Injection technique: LOR air  Needle:  Needle type: Tuohy  Needle gauge: 17 G Needle length: 9 cm and 9 Needle insertion depth: 6 cm Catheter type: closed end flexible Catheter size: 19 Gauge Catheter at skin depth: 11 cm Test dose: negative and Other  Assessment Sensory level: T9 Events: blood not aspirated, injection not painful, no injection resistance, negative IV test and no paresthesia

## 2014-10-03 NOTE — Lactation Note (Signed)
This note was copied from the chart of [place]. Lactation Consultation Note  Patient Name: Amanda Burns QBHAL'P Date: 10/03/2014 Reason for consult: Follow-up assessmentwith this mom of a term baby, now 45 hours old. Mom and baby are being discharged to home today. Mom initi breast fed a few times, but has been formula feeding since. Mom said due there her "OCD" she needs to know how much the baby is getting. She has a DEP at home, and said she may pump and bottle once home. [place] care reviewed, and mom knows she can call lactation for questions/concerns   Maternal Data    Feeding Feeding Type: Bottle Fed - Formula  LATCH Score/Interventions                      Lactation Tools Discussed/Used     Consult Status Consult Status: Complete    Tonna Corner 10/03/2014, 8:28 AM

## 2014-10-03 NOTE — Progress Notes (Signed)
Postpartum Note Day # 2  S:  Patient resting comfortable in bed.  Pain controlled.  Tolerating general. + flatus, + BM.  Lochia moderate.  Ambulating without difficulty.  She denies n/v/f/c, SOB, or CP. Plans on bottle feeding  O: Temp:  [98 F (36.7 C)-98.1 F (36.7 C)] 98 F (36.7 C) (07/22 0612) Pulse Rate:  [71-76] 76 (07/22 0612) Resp:  [18] 18 (07/22 0612) BP: (122)/(61-73) 122/61 mmHg (07/22 0612) Gen: A&Ox3, NAD CV: RRR, no MRG Resp: CTAB Abdomen: soft, NT, ND Uterus: firm, non-tender, below umbilicus Ext: No edema, no calf tenderness bilaterally Labs:   Recent Labs  10/01/14 0550 10/02/14 0535  HGB 10.8* 10.1*    A/P: Pt is a 34 y.o. [id number] s/p VAVD complicated by shoulder dystocia, PPD#2  - Pain well controlled -GU: Voiding freely -GI: Tolerating general diet -Activity: encouraged sitting up to chair and ambulation as tolerated -Prophylaxis: early ambulation -Labs: stable as above -Baby boy circ completed  DISPO: Pt meeting postpartum milestones appropriately, plan for discharge home today.  Janyth Pupa, DO [phone] (pager) [phone] (office)

## 2014-10-07 NOTE — Discharge Summary (Signed)
OB Discharge Summary  Patient Name: Amanda Burns DOB: 1981/03/08 MRN: [id number]  Date of admission: 10/01/2014     Date of discharge: 10/03/2014  3:41 PM  Admitting diagnosis: Onset of Labor   Intrauterine pregnancy: [redacted]w[redacted]d     Secondary diagnosis: GERD     Discharge diagnosis: Term Pregnancy Delivered and Shoulder dystocia  Method of delivery:      Information for the patient's newborn:  Bettye Boeck [[id number]]  Delivery Method: Vaginal, Vacuum (Extractor) (Filed from Delivery Summary)                                                                                                     Intrapartum Procedures:                                                          Post partum procedures:none      Hospital course:  Onset of Labor With Vaginal Delivery     34 y.o. yo G2P2002 at [redacted]w[redacted]d was admitted in Latent Laboron 10/01/2014. Patient had an uncomplicated labor course as follows:   Patient received an epidural for pain and was augmented with AROM (clear fluid). She progressed expectantly to complete dilation. Due to maternal exhaustion, pt was consented for VAVD. Verbal consent: obtained from patient. Risks and benefits discussed in detail. Risks include, but are not limited to the risks of anesthesia, bleeding, infection, damage to maternal tissues, fetal cephalhematoma. There is also the risk of inability to effect vaginal delivery of the head, or shoulder dystocia that cannot be resolved by established maneuvers, leading to the need for emergency cesarean section.  At 2:19 PM a viable female was delivered via Vaginal, Vacuum Neurosurgeon). Presentation: vertex; Position: Occiput,, Anterior; Station: +3. Vacuum was applied, 2 pulls, no pop offs. Upon delivery of fetal head, shoulder dystocia was noted.  Delivery of the head: 10/01/2014 2:19 PM First maneuver: 10/01/2014 2:19 PM, [place], Second maneuver: posterior arm delivered. Dystocia lasted ~ 20sec.       Length of Stage 3:                                    Mediations and procedures used include: AROM  Patient had a delivery of a Viable infant. 10/01/2014  Information for the patient's newborn:  Bettye Boeck [[id number]]  Delivery Method: Vaginal, Vacuum (Extractor) (Filed from Delivery Summary)    Pateint had an uncomplicated postpartum course.  She is ambulating, tolerating a regular diet, passing flatus, and urinating well. Patient is discharged home in stable condition on 10/03/2014  3:41 PM.    Physical exam  Filed Vitals:   10/01/14 2055 10/02/14 0550 10/02/14 1730 10/03/14 0612  BP: 101/69 121/53 122/73 122/61  Pulse: 101 79 71 76  Temp: 99.1 F (37.3 C) 97.9 F (36.6 C) 98.1 F (36.7  C) 98 F (36.7 C)  TempSrc:  Oral Oral Oral  Resp: 20 18 18 18   Height:      Weight:      SpO2:       General: alert, cooperative and no distress Lochia: appropriate Uterine Fundus: firm, soft, non-tender Incision: N/A DVT Evaluation: No evidence of DVT seen on physical exam. Labs: Lab Results  Component Value Date   WBC 11.2* 10/02/2014   HGB 10.1* 10/02/2014   HCT 30.6* 10/02/2014   MCV 83.4 10/02/2014   PLT 261 10/02/2014   No flowsheet data found.  Discharge instruction: per After Visit Summary and "Baby and Me Booklet".  Medications:   Medication List    STOP taking these medications        omeprazole 40 MG capsule  Commonly known as:  PRILOSEC     ranitidine 150 MG tablet  Commonly known as:  ZANTAC     traMADol 50 MG tablet  Commonly known as:  ULTRAM      TAKE these medications        prenatal multivitamin Tabs tablet  Take 1 tablet by mouth daily at 12 noon.        Diet: routine diet  Activity: Advance as tolerated. Pelvic rest for 6 weeks.   Outpatient follow up:6 weeks  Postpartum contraception: Progesterone only pills  Newborn Data: Live born female  Birth Weight: 7 lb 7.6 oz (3390 g) APGAR: 8, 8  Baby Feeding:  Bottle Disposition:home with mother   10/07/2014 Janyth Pupa, M, DO

## 2019-03-15 DIAGNOSIS — C50919 Malignant neoplasm of unspecified site of unspecified female breast: Secondary | ICD-10-CM

## 2019-03-15 HISTORY — DX: Malignant neoplasm of unspecified site of unspecified female breast: C50.919

## 2019-06-14 ENCOUNTER — Ambulatory Visit: Payer: BC Managed Care – PPO | Attending: Internal Medicine

## 2019-06-14 DIAGNOSIS — Z23 Encounter for immunization: Secondary | ICD-10-CM

## 2019-06-14 NOTE — Progress Notes (Signed)
   Covid-19 Vaccination Clinic  Name:  Nicolette Suite    MRN: [id number] DOB: 05-13-1980  06/14/2019  Ms. Olle was observed post Covid-19 immunization for 15 minutes without incident. She was provided with Vaccine Information Sheet and instruction to access the V-Safe system.   Ms. Gosch was instructed to call 911 with any severe reactions post vaccine: Marland Kitchen Difficulty breathing  . Swelling of face and throat  . A fast heartbeat  . A bad rash all over body  . Dizziness and weakness   Immunizations Administered    Name Date Dose VIS Date Route   Pfizer COVID-19 Vaccine 06/14/2019 11:35 AM 0.3 mL 02/22/2019 Intramuscular   Manufacturer: [employer], [employer]   Lot: [id number]   [place]: [id number]

## 2019-07-08 ENCOUNTER — Ambulatory Visit: Payer: BC Managed Care – PPO | Attending: Internal Medicine

## 2019-07-08 DIAGNOSIS — Z23 Encounter for immunization: Secondary | ICD-10-CM

## 2019-07-08 NOTE — Progress Notes (Signed)
   Covid-19 Vaccination Clinic  Name:  Amanda Burns    MRN: [id number] DOB: Jul 18, 1980  07/08/2019  [name] was observed post Covid-19 immunization for 15 minutes without incident. She was provided with Vaccine Information Sheet and instruction to access the V-Safe system.   [name] was instructed to call 911 with any severe reactions post vaccine: Marland Kitchen Difficulty breathing  . Swelling of face and throat  . A fast heartbeat  . A bad rash all over body  . Dizziness and weakness   Immunizations Administered    Name Date Dose VIS Date Route   Pfizer COVID-19 Vaccine 07/08/2019  3:01 PM 0.3 mL 05/08/2018 Intramuscular   Manufacturer: [place]   Lot: [id number]   [place]: [id number]

## 2019-07-26 DIAGNOSIS — E669 Obesity, unspecified: Secondary | ICD-10-CM | POA: Insufficient documentation

## 2019-07-26 DIAGNOSIS — I10 Essential (primary) hypertension: Secondary | ICD-10-CM | POA: Insufficient documentation

## 2019-07-26 DIAGNOSIS — K219 Gastro-esophageal reflux disease without esophagitis: Secondary | ICD-10-CM | POA: Insufficient documentation

## 2019-07-26 HISTORY — DX: Essential (primary) hypertension: I10

## 2019-10-24 ENCOUNTER — Other Ambulatory Visit: Payer: Self-pay | Admitting: Obstetrics & Gynecology

## 2019-10-24 DIAGNOSIS — N6320 Unspecified lump in the left breast, unspecified quadrant: Secondary | ICD-10-CM

## 2019-11-12 ENCOUNTER — Other Ambulatory Visit: Payer: Self-pay | Admitting: Obstetrics & Gynecology

## 2019-11-12 ENCOUNTER — Ambulatory Visit
Admission: RE | Admit: 2019-11-12 | Discharge: 2019-11-12 | Disposition: A | Discharge status: Home / Self Care | Payer: BC Managed Care – PPO | Source: Ambulatory Visit | Attending: Obstetrics & Gynecology | Admitting: Obstetrics & Gynecology

## 2019-11-12 ENCOUNTER — Other Ambulatory Visit: Payer: Self-pay

## 2019-11-12 DIAGNOSIS — N6310 Unspecified lump in the right breast, unspecified quadrant: Secondary | ICD-10-CM

## 2019-11-12 DIAGNOSIS — N6320 Unspecified lump in the left breast, unspecified quadrant: Secondary | ICD-10-CM

## 2019-11-12 DIAGNOSIS — R928 Other abnormal and inconclusive findings on diagnostic imaging of breast: Secondary | ICD-10-CM

## 2019-11-20 DIAGNOSIS — F411 Generalized anxiety disorder: Secondary | ICD-10-CM | POA: Insufficient documentation

## 2019-11-20 DIAGNOSIS — F988 Other specified behavioral and emotional disorders with onset usually occurring in childhood and adolescence: Secondary | ICD-10-CM | POA: Insufficient documentation

## 2019-11-22 ENCOUNTER — Ambulatory Visit
Admission: RE | Admit: 2019-11-22 | Discharge: 2019-11-22 | Disposition: A | Discharge status: Home / Self Care | Payer: BC Managed Care – PPO | Source: Ambulatory Visit | Attending: Obstetrics & Gynecology | Admitting: Obstetrics & Gynecology

## 2019-11-22 ENCOUNTER — Other Ambulatory Visit: Payer: Self-pay

## 2019-11-22 DIAGNOSIS — N6320 Unspecified lump in the left breast, unspecified quadrant: Secondary | ICD-10-CM

## 2019-11-22 DIAGNOSIS — N6310 Unspecified lump in the right breast, unspecified quadrant: Secondary | ICD-10-CM

## 2019-11-29 ENCOUNTER — Encounter: Payer: Self-pay | Admitting: *Deleted

## 2019-11-29 DIAGNOSIS — C50412 Malignant neoplasm of upper-outer quadrant of left female breast: Secondary | ICD-10-CM | POA: Insufficient documentation

## 2019-12-03 NOTE — Progress Notes (Signed)
[facility] CONSULT NOTE  Patient Care Team: Pcp, No as PCP - General Pershing Proud, RN as Oncology Nurse Navigator Donnelly Angelica, RN as Oncology Nurse Navigator Almond Lint, MD as Consulting Physician (General Surgery) Serena Croissant, MD as Consulting Physician (Hematology and Oncology) Antony Blackbird, MD as Consulting Physician (Radiation Oncology)  CHIEF COMPLAINTS/PURPOSE OF CONSULTATION:  Newly diagnosed breast cancer  HISTORY OF PRESENTING ILLNESS:  Amanda Burns 39 y.o. female is here because of recent diagnosis of invasive ductal carcinoma of the left breast. Patient palpated an area of concern in the left breast. Diagnostic mammogram and [place] on 11/12/19 showed a 1.8cm mass in the upper outer right breast representing a fibroadenoma, and in the left breast, three adjacent masses at the 2-2:30 position, 1.7cm, 0.8cm, and 1.4cm, with several cysts and no axillary adenopathy. Biopsy on 11/22/19 showed a benign fibroadenoma in the right breast, and in the left breast, invasive ductal carcinoma at the 2:30 position, grade 3, HER-2 negative (1+), ER+ 0%, PR+ 0%, Ki67 80%. She presents to the clinic today for initial evaluation and discussion of treatment options.   I reviewed her records extensively and collaborated the history with the patient.  SUMMARY OF ONCOLOGIC HISTORY: Oncology History  Malignant neoplasm of upper-outer quadrant of left breast in female, estrogen receptor negative (HCC)  11/29/2019 Initial Diagnosis   Patient palpated an area of concern in the left breast. Diagnostic mammogram and US showed a 1.8cm mass in the upper outer right breast representing a fibroadenoma, and in the left breast, three adjacent masses at the 2-2:30 position, 1.7cm, 0.8cm, and 1.4cm, with several cysts and no axillary adenopathy. Biopsy showed a benign fibroadenoma in the right breast, and in the left breast, IDC at the 2:30 position, grade 3, HER-2 negative (1+), ER+ 0%, PR+ 0%,  Ki67 80%.    12/04/2019 Cancer Staging   Staging form: Breast, AJCC 8th Edition - Clinical: Stage IB (cT1c, cN0, cM0, G3, ER-, PR-, HER2-) - Signed by Serena Croissant, MD on 12/04/2019     MEDICAL HISTORY:  Past Medical History:  Diagnosis Date  . Breast cancer (HCC)   . Gestational diabetes   . Gestational diabetes mellitus, currently pregnant   . Postpartum care following vaginal delivery (3/28) 06/08/2013    SURGICAL HISTORY: Past Surgical History:  Procedure Laterality Date  . FOOT SURGERY      SOCIAL HISTORY: Social History   Socioeconomic History  . Marital status: Single    Spouse name: Not on file  . Number of children: Not on file  . Years of education: Not on file  . Highest education level: Not on file  Occupational History  . Not on file  Tobacco Use  . Smoking status: Never Smoker  . Smokeless tobacco: Never Used  Substance and Sexual Activity  . Alcohol use: No  . Drug use: No  . Sexual activity: Yes    Birth control/protection: None  Other Topics Concern  . Not on file  Social History Narrative  . Not on file   Social Determinants of Health   Financial Resource Strain:   . Difficulty of Paying Living Expenses: Not on file  Food Insecurity:   . Worried About Programme researcher, broadcasting/film/video in the Last Year: Not on file  . Ran Out of Food in the Last Year: Not on file  Transportation Needs:   . Lack of Transportation (Medical): Not on file  . Lack of Transportation (Non-Medical): Not on file  Physical  Activity:   . Days of Exercise per Week: Not on file  . Minutes of Exercise per Session: Not on file  Stress:   . Feeling of Stress : Not on file  Social Connections:   . Frequency of Communication with Friends and Family: Not on file  . Frequency of Social Gatherings with Friends and Family: Not on file  . Attends Religious Services: Not on file  . Active Member of Clubs or Organizations: Not on file  . Attends Archivist Meetings: Not on file    . Marital Status: Not on file  Intimate Partner Violence:   . Fear of Current or Ex-Partner: Not on file  . Emotionally Abused: Not on file  . Physically Abused: Not on file  . Sexually Abused: Not on file    FAMILY HISTORY: Family History  Problem Relation Age of Onset  . Cancer Other   . Breast cancer Maternal Grandmother   . Breast cancer Paternal Aunt   . Breast cancer Paternal Aunt     ALLERGIES:  is allergic to hydrocodone.  MEDICATIONS:  Current Outpatient Medications  Medication Sig Dispense Refill  . Adapalene 0.3 % gel Apply 0.3 application topically daily.    Marland Kitchen amphetamine-dextroamphetamine (ADDERALL XR) 20 MG 24 hr capsule Take 20 mg by mouth every morning.    Marland Kitchen buPROPion (WELLBUTRIN XL) 300 MG 24 hr tablet Take 300 mg by mouth every morning.    Marland Kitchen dexamethasone (DECADRON) 4 MG tablet Take 1 tablet day before chemo and 1 tablet day after chemo with food 30 tablet 1  . doxycycline (PERIOSTAT) 20 MG tablet Take 20 mg by mouth 2 (two) times daily.    Marland Kitchen escitalopram (LEXAPRO) 20 MG tablet Take 20 mg by mouth daily.    Marland Kitchen lidocaine-prilocaine (EMLA) cream Apply to affected area once 30 g 3  . LORazepam (ATIVAN) 0.5 MG tablet Take 1 tablet (0.5 mg total) by mouth at bedtime as needed for sleep. 30 tablet 0  . omeprazole (PRILOSEC) 20 MG capsule Take 20 mg by mouth daily.    . ondansetron (ZOFRAN) 8 MG tablet Take 1 tablet (8 mg total) by mouth 2 (two) times daily as needed. Start on the third day after chemotherapy. 30 tablet 1  . Prenatal Vit-Fe Fumarate-FA (PRENATAL MULTIVITAMIN) TABS tablet Take 1 tablet by mouth daily at 12 noon.    . prochlorperazine (COMPAZINE) 10 MG tablet Take 1 tablet (10 mg total) by mouth every 6 (six) hours as needed (Nausea or vomiting). 30 tablet 1   No current facility-administered medications for this visit.    REVIEW OF SYSTEMS:   Constitutional: Denies fevers, chills or abnormal night sweats Eyes: Denies blurriness of vision, double  vision or watery eyes Ears, nose, mouth, throat, and face: Denies mucositis or sore throat Respiratory: Denies cough, dyspnea or wheezes Cardiovascular: Denies palpitation, chest discomfort or lower extremity swelling Gastrointestinal:  Denies nausea, heartburn or change in bowel habits Skin: Denies abnormal skin rashes Lymphatics: Denies new lymphadenopathy or easy bruising Neurological:Denies numbness, tingling or new weaknesses Behavioral/Psych: Mood is stable, no new changes  Breast: Denies any palpable lumps or discharge All other systems were reviewed with the patient and are negative.  PHYSICAL EXAMINATION: ECOG PERFORMANCE STATUS: 1 - Symptomatic but completely ambulatory  Vitals:   12/04/19 0848  BP: (!) 149/95  Pulse: (!) 122  Resp: 20  Temp: 99 F (37.2 C)  SpO2: 100%   Filed Weights   12/04/19 0848  Weight: 174 lb  9.6 oz (79.2 kg)       LABORATORY DATA:  I have reviewed the data as listed Lab Results  Component Value Date   WBC 8.0 12/04/2019   HGB 12.7 12/04/2019   HCT 38.7 12/04/2019   MCV 79.3 (L) 12/04/2019   PLT 459 (H) 12/04/2019   Lab Results  Component Value Date   NA 136 12/04/2019   K 4.0 12/04/2019   CL 102 12/04/2019   CO2 29 12/04/2019    RADIOGRAPHIC STUDIES:    ASSESSMENT AND PLAN:  Malignant neoplasm of upper-outer quadrant of left breast in female, estrogen receptor negative ([place]) Palpable left breast mass: 1.8 cm UOQ fibroadenoma in the right breast, left breast 3 masses 2 to 2:30 position: 1.7, 1.8, 1.4 cm.  Biopsy revealed IDC grade 3, ER 0%, PR 0%, Ki-67 80%, HER-2 -1+  T1c M0 stage IB  Pathology and radiology counseling: Discussed with the patient, the details of pathology including the type of breast cancer,the clinical staging, the significance of ER, PR and HER-2/neu receptors and the implications for treatment. After reviewing the pathology in detail, we proceeded to discuss the different treatment options between surgery,  radiation, chemotherapy, antiestrogen therapies.  Treatment plan: 1.  Neoadjuvant chemotherapy with Adriamycin and Cytoxan pembrolizumab every 3 weeks x4 followed by weekly Taxol and carbo with Keytruda every 3 weeks x 4 followed by Beryle Flock maintenance for a year 2. breast conserving surgery with sentinel lymph node biopsy 3.  Adjuvant radiation  Chemotherapy Counseling: I discussed the risks and benefits of chemotherapy including the risks of nausea/ vomiting, risk of infection from low WBC count, fatigue due to chemo or anemia, bruising or bleeding due to low platelets, mouth sores, loss/ change in taste and decreased appetite. Liver and kidney function will be monitored through out chemotherapy as abnormalities in liver and kidney function may be a side effect of treatment. Cardiac dysfunction due to Adriamycin and neuropathy risk from Taxol discussed in detail.  Immune mediated adverse effects were also discussed risk of permanent bone marrow dysfunction and leukemia due to chemo were also discussed.  Plan: 1.  Echocardiogram 2. chemo class 3.  Port placement  She will receive Covid vaccine booster as well as flu vaccine today  Start chemo in 2 weeks  All questions were answered. The patient knows to call the clinic with any problems, questions or concerns.  Rulon Eisenmenger, MD, MPH 12/04/2019    I, Molly Dorshimer, am acting as scribe for [name], MD.  I have reviewed the above documentation for accuracy and completeness, and I agree with the above.

## 2019-12-04 ENCOUNTER — Encounter: Payer: Self-pay | Admitting: *Deleted

## 2019-12-04 ENCOUNTER — Inpatient Hospital Stay: Payer: BC Managed Care – PPO

## 2019-12-04 ENCOUNTER — Encounter: Payer: Self-pay | Admitting: Hematology and Oncology

## 2019-12-04 ENCOUNTER — Ambulatory Visit (HOSPITAL_BASED_OUTPATIENT_CLINIC_OR_DEPARTMENT_OTHER): Payer: BC Managed Care – PPO | Admitting: Genetic Counselor

## 2019-12-04 ENCOUNTER — Inpatient Hospital Stay: Payer: BC Managed Care – PPO | Attending: Hematology and Oncology | Admitting: Hematology and Oncology

## 2019-12-04 ENCOUNTER — Other Ambulatory Visit: Payer: Self-pay

## 2019-12-04 ENCOUNTER — Encounter: Payer: Self-pay | Admitting: Licensed Clinical Social Worker

## 2019-12-04 ENCOUNTER — Other Ambulatory Visit: Payer: Self-pay | Admitting: General Surgery

## 2019-12-04 ENCOUNTER — Ambulatory Visit
Admission: RE | Admit: 2019-12-04 | Discharge: 2019-12-04 | Disposition: A | Discharge status: Home / Self Care | Payer: BC Managed Care – PPO | Source: Ambulatory Visit | Attending: Radiation Oncology | Admitting: Radiation Oncology

## 2019-12-04 ENCOUNTER — Other Ambulatory Visit: Payer: Self-pay | Admitting: *Deleted

## 2019-12-04 ENCOUNTER — Ambulatory Visit: Payer: BC Managed Care – PPO | Attending: General Surgery | Admitting: Physical Therapy

## 2019-12-04 ENCOUNTER — Encounter: Payer: Self-pay | Admitting: Physical Therapy

## 2019-12-04 ENCOUNTER — Telehealth: Payer: Self-pay | Admitting: *Deleted

## 2019-12-04 VITALS — BP 149/95 | HR 122 | Temp 99.0°F | Resp 20 | Ht 61.0 in | Wt 174.6 lb

## 2019-12-04 DIAGNOSIS — R293 Abnormal posture: Secondary | ICD-10-CM | POA: Insufficient documentation

## 2019-12-04 DIAGNOSIS — Z23 Encounter for immunization: Secondary | ICD-10-CM | POA: Diagnosis not present

## 2019-12-04 DIAGNOSIS — Z171 Estrogen receptor negative status [ER-]: Secondary | ICD-10-CM

## 2019-12-04 DIAGNOSIS — C50412 Malignant neoplasm of upper-outer quadrant of left female breast: Secondary | ICD-10-CM | POA: Diagnosis not present

## 2019-12-04 DIAGNOSIS — Z800 Family history of malignant neoplasm of digestive organs: Secondary | ICD-10-CM

## 2019-12-04 DIAGNOSIS — Z803 Family history of malignant neoplasm of breast: Secondary | ICD-10-CM | POA: Insufficient documentation

## 2019-12-04 DIAGNOSIS — D241 Benign neoplasm of right breast: Secondary | ICD-10-CM | POA: Diagnosis not present

## 2019-12-04 DIAGNOSIS — Z808 Family history of malignant neoplasm of other organs or systems: Secondary | ICD-10-CM

## 2019-12-04 LAB — CMP (CANCER CENTER ONLY)
ALT: 24 U/L (ref 0–44)
AST: 19 U/L (ref 15–41)
Albumin: 4.1 g/dL (ref 3.5–5.0)
Alkaline Phosphatase: 107 U/L (ref 38–126)
Anion gap: 5 (ref 5–15)
BUN: 8 mg/dL (ref 6–20)
CO2: 29 mmol/L (ref 22–32)
Calcium: 9.8 mg/dL (ref 8.9–10.3)
Chloride: 102 mmol/L (ref 98–111)
Creatinine: 0.9 mg/dL (ref 0.44–1.00)
GFR, Est AFR Am: >60 mL/min (ref >60)
GFR, Estimated: >60 mL/min (ref >60)
Glucose, Bld: 102 mg/dL — ABNORMAL HIGH (ref 70–99)
Potassium: 4 mmol/L (ref 3.5–5.1)
Sodium: 136 mmol/L (ref 135–145)
Total Bilirubin: 0.4 mg/dL (ref 0.3–1.2)
Total Protein: 7.9 g/dL (ref 6.5–8.1)

## 2019-12-04 LAB — CBC WITH DIFFERENTIAL (CANCER CENTER ONLY)
Abs Immature Granulocytes: 0.01 10*3/uL (ref 0.00–0.07)
Basophils Absolute: 0.1 10*3/uL (ref 0.0–0.1)
Basophils Relative: 1 %
Eosinophils Absolute: 0.2 10*3/uL (ref 0.0–0.5)
Eosinophils Relative: 2 %
HCT: 38.7 % (ref 36.0–46.0)
Hemoglobin: 12.7 g/dL (ref 12.0–15.0)
Immature Granulocytes: 0 %
Lymphocytes Relative: 24 %
Lymphs Abs: 1.9 10*3/uL (ref 0.7–4.0)
MCH: 26 pg (ref 26.0–34.0)
MCHC: 32.8 g/dL (ref 30.0–36.0)
MCV: 79.3 fL — ABNORMAL LOW (ref 80.0–100.0)
Monocytes Absolute: 0.5 10*3/uL (ref 0.1–1.0)
Monocytes Relative: 6 %
Neutro Abs: 5.4 10*3/uL (ref 1.7–7.7)
Neutrophils Relative %: 67 %
Platelet Count: 459 10*3/uL — ABNORMAL HIGH (ref 150–400)
RBC: 4.88 MIL/uL (ref 3.87–5.11)
RDW: 13.8 % (ref 11.5–15.5)
WBC Count: 8 10*3/uL (ref 4.0–10.5)
nRBC: 0 % (ref 0.0–0.2)

## 2019-12-04 LAB — GENETIC SCREENING ORDER

## 2019-12-04 MED ORDER — LORAZEPAM 0.5 MG PO TABS: 0.5000 mg | ORAL_TABLET | Freq: Every evening | ORAL | 0 refills | Status: DC | PRN

## 2019-12-04 MED ORDER — LIDOCAINE-PRILOCAINE 2.5-2.5 % EX CREA: TOPICAL_CREAM | CUTANEOUS | 3 refills | Status: DC

## 2019-12-04 MED ORDER — ONDANSETRON HCL 8 MG PO TABS: 8.0000 mg | ORAL_TABLET | Freq: Two times a day (BID) | ORAL | 1 refills | Status: DC | PRN

## 2019-12-04 MED ORDER — INFLUENZA VAC A&B SA ADJ QUAD 0.5 ML IM PRSY
0.5000 mL | PREFILLED_SYRINGE | Freq: Once | INTRAMUSCULAR | Status: AC
  Administered 2019-12-04: 0.5 mL via INTRAMUSCULAR

## 2019-12-04 MED ORDER — DEXAMETHASONE 4 MG PO TABS: ORAL_TABLET | ORAL | 1 refills | Status: DC

## 2019-12-04 MED ORDER — PROCHLORPERAZINE MALEATE 10 MG PO TABS: 10.0000 mg | ORAL_TABLET | Freq: Four times a day (QID) | ORAL | 1 refills | Status: DC | PRN

## 2019-12-04 NOTE — Assessment & Plan Note (Signed)
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  Biopsy revealed IDC grade 3, ER 0%, PR 0%, Ki-67 80%, HER-2 -1+  T1c M0 stage IB  Pathology and radiology counseling: Discussed with the patient, the details of pathology including the type of breast cancer,the clinical staging, the significance of ER, PR and HER-2/neu receptors and the implications for treatment. After reviewing the pathology in detail, we proceeded to discuss the different treatment options between surgery, radiation, chemotherapy, antiestrogen therapies.  Treatment plan: 1.  Neoadjuvant chemotherapy with Adriamycin and Cytoxan pembrolizumab every 3 weeks x4 followed by weekly Taxol and carbo with Keytruda every 3 weeks x 4 followed by Beryle Flock maintenance for a year 2. breast conserving surgery with sentinel lymph node biopsy 3.  Adjuvant radiation  Chemotherapy Counseling: I discussed the risks and benefits of chemotherapy including the risks of nausea/ vomiting, risk of infection from low WBC count, fatigue due to chemo or anemia, bruising or bleeding due to low platelets, mouth sores, loss/ change in taste and decreased appetite. Liver and kidney function will be monitored through out chemotherapy as abnormalities in liver and kidney function may be a side effect of treatment. Cardiac dysfunction due to Adriamycin and neuropathy risk from Taxol discussed in detail.  Immune mediated adverse effects were also discussed risk of permanent bone marrow dysfunction and leukemia due to chemo were also discussed.   Plan: 1.  Echocardiogram 2. chemo class 3.  Port placement  Start chemo in 2 weeks

## 2019-12-04 NOTE — Progress Notes (Signed)
Clinical Social Work [place] Psychosocial Distress Screening [place]   Patient completed distress screening protocol. Clinical Social Worker met with patient and patient's Mother in [facility] to assess for distress and other psychosocial needs.  Patient stated she was feeling overwhelmed but felt a little better although still nervous after meeting with the treatment team and getting more information on her diagnosis and treatment plan.    CSW and patient discussed common feeling and emotions when being diagnosed with cancer, and the importance of support during treatment.    Barriers to care/review of distress screen:  - Transportation:  No concerns at this time - Help at home: Lives with her two kids, ages 27 & 73. Bought a home May 2021. Her mom can help if needed - Support system: Main support is her mom. Has one good friend as well.  Has professional support from a therapist - Finances:    o Employment status: currently employed & source of income: Employment o Will have access to short-term disability (70%) and FMLA but concerned about finances with reduction in any hours. - Coping: enjoys time with her kids, watching TV, reading   Distress Screen: ONCBCN DISTRESS SCREENING 12/04/2019  Screening Type Initial Screening  Emotional problem type Nervousness/Anxiety  Information Concerns Type Lack of info about diagnosis;Lack of info about treatment   Identifications of barriers to care: Financial  Availability of community resources: yes, CSW provided information in clinic and via email about breast cancer foundations and resources through [place] Worker follow up needed: Patient to reach out if needed for assistance with applications or for emotional/coping support  CSW informed patient of the support team and support services at [facility].  CSW provided contact information and encouraged patient to call with any questions or concerns.   Antoine

## 2019-12-04 NOTE — Progress Notes (Signed)
Radiation Oncology         [phone] ________________________________  Multidisciplinary Breast Oncology Clinic [facility]) Initial Outpatient Consultation  Name: Amanda Burns MRN: [id number]  Date: 12/04/2019  DOB: 18-Oct-1980  CC:Amanda Kroner, DO  Amanda Klein, MD   REFERRING PHYSICIAN: [name], MD  DIAGNOSIS: The encounter diagnosis was Malignant neoplasm of upper-outer quadrant of left breast in female, estrogen receptor negative ([place]).  Stage T1b, Nx, Mx, Left Breast UOQ, Invasive Ductal Carcinoma, ER- / PR- / Her2-, Grade 3    ICD-10-CM   1. Malignant neoplasm of upper-outer quadrant of left breast in female, estrogen receptor negative ([place])  C50.412    Z17.1     HISTORY OF PRESENT ILLNESS::Amanda Burns is a 39 y.o. female who is presenting to the office today for evaluation of her newly diagnosed breast cancer. She is accompanied by her mother. She is doing well overall.   She underwent bilateral diagnostic mammography with tomography and bilateral breast ultrasonography at [facility] on 11/12/2019 for evaluation of palpable left breast mass. Results showed three highly suspicious left breast masses, all together measuring up to 5.2 cm mammographically. There was also an indeterminate 1.5 mass in the right breast that demonstrated imaging features suggestive of a fibroadenoma. Finally, there were bilateral breast cysts. No axillary lymphadenopathy was noted.  Biopsy on 11/22/2019 showed grade 3 invasive ductal carcinoma of the left breast mass at the 2 o'clock position. Prognostic indicators were significant for estrogen receptor, 0% negative and progesterone receptor, 0% negative. Proliferation marker Ki67 at 80%. HER2 negative. The left breast mass at the 2:30 o'clock position showed atypical ductal hyperplasia and atypical lobular hyperplasia involving fibroadenoma and the right breast mass at the 10 o'clock position showed fibroadenoma and pseudoangiomatous stromal  hyperplasia.  Menarche: 39 years old Age at first live birth: 39 years old GP: 2 LMP: 11/13/2019 Contraceptive: Yes, from approximately 2000-2008 HRT: Never   The patient was referred today for presentation in the multidisciplinary conference.  Radiology studies and pathology slides were presented there for review and discussion of treatment options.  A consensus was discussed regarding potential next steps.  PREVIOUS RADIATION THERAPY: No  PAST MEDICAL HISTORY:  Past Medical History:  Diagnosis Date  . Breast cancer ([place])   . Family history of adrenal cancer   . Family history of breast cancer   . Family history of colon cancer   . Family history of throat cancer   . Gestational diabetes   . Gestational diabetes mellitus, currently pregnant   . Postpartum care following vaginal delivery (3/28) 06/08/2013    PAST SURGICAL HISTORY: Past Surgical History:  Procedure Laterality Date  . FOOT SURGERY      FAMILY HISTORY:  Family History  Problem Relation Age of Onset  . Breast cancer Other        maternal great-aunt (MGF's sister), dx in her 18s  . Breast cancer Maternal Grandmother 3  . Colon cancer Maternal Grandmother 79  . Cancer Maternal Grandmother 53       Adrenal cancer  . Breast cancer Paternal Aunt        dx. in her 40s  . Throat cancer Paternal Aunt        dx. in her 58s, non-smoker    SOCIAL HISTORY:  Social History   Socioeconomic History  . Marital status: Single    Spouse name: Not on file  . Number of children: Not on file  . Years of education: Not on file  .  Highest education level: Not on file  Occupational History  . Not on file  Tobacco Use  . Smoking status: Never Smoker  . Smokeless tobacco: Never Used  Substance and Sexual Activity  . Alcohol use: No  . Drug use: No  . Sexual activity: Yes    Birth control/protection: None  Other Topics Concern  . Not on file  Social History Narrative  . Not on file   Social Determinants of  Health   Financial Resource Strain:   . Difficulty of Paying Living Expenses: Not on file  Food Insecurity:   . Worried About Charity fundraiser in the Last Year: Not on file  . Ran Out of Food in the Last Year: Not on file  Transportation Needs:   . Lack of Transportation (Medical): Not on file  . Lack of Transportation (Non-Medical): Not on file  Physical Activity:   . Days of Exercise per Week: Not on file  . Minutes of Exercise per Session: Not on file  Stress:   . Feeling of Stress : Not on file  Social Connections:   . Frequency of Communication with Friends and Family: Not on file  . Frequency of Social Gatherings with Friends and Family: Not on file  . Attends Religious Services: Not on file  . Active Member of Clubs or Organizations: Not on file  . Attends Archivist Meetings: Not on file  . Marital Status: Not on file    ALLERGIES:  Allergies  Allergen Reactions  . Hydrocodone Itching  . Other Itching and Swelling    Benzoyl Peroxide    MEDICATIONS:  Current Outpatient Medications  Medication Sig Dispense Refill  . Adapalene 0.3 % gel Apply 0.3 application topically daily.    Marland Kitchen amphetamine-dextroamphetamine (ADDERALL XR) 20 MG 24 hr capsule Take 20 mg by mouth daily.     . Biotin 5000 MCG CAPS Take 5,000 mcg by mouth daily.    Marland Kitchen buPROPion (WELLBUTRIN XL) 300 MG 24 hr tablet Take 300 mg by mouth daily.     Marland Kitchen dexamethasone (DECADRON) 4 MG tablet Take 1 tablet day before chemo and 1 tablet day after chemo with food 30 tablet 1  . doxycycline (PERIOSTAT) 20 MG tablet Take 20 mg by mouth 2 (two) times daily.    Marland Kitchen escitalopram (LEXAPRO) 20 MG tablet Take 20 mg by mouth daily.    Marland Kitchen lidocaine-prilocaine (EMLA) cream Apply to affected area once (Patient taking differently: Apply 1 application topically once. Apply to affected area once) 30 g 3  . LORazepam (ATIVAN) 0.5 MG tablet Take 1 tablet (0.5 mg total) by mouth at bedtime as needed for sleep. 30 tablet 0    . Multiple Vitamins-Minerals (MULTIVITAMIN WITH MINERALS) tablet Take 1 tablet by mouth daily.    Marland Kitchen omeprazole (PRILOSEC) 20 MG capsule Take 20 mg by mouth daily.    . ondansetron (ZOFRAN) 8 MG tablet Take 1 tablet (8 mg total) by mouth 2 (two) times daily as needed. Start on the third day after chemotherapy. 30 tablet 1  . prochlorperazine (COMPAZINE) 10 MG tablet Take 1 tablet (10 mg total) by mouth every 6 (six) hours as needed (Nausea or vomiting). 30 tablet 1   No current facility-administered medications for this encounter.    REVIEW OF SYSTEMS: A 10+ POINT REVIEW OF SYSTEMS WAS OBTAINED including neurology, dermatology, psychiatry, cardiac, respiratory, lymph, extremities, GI, GU, musculoskeletal, constitutional, reproductive, HEENT. On the provided form, she reports right arm pain, muscle aches, mouth  sores, dry cough, sleeping with two pillows, heartburn, lump in left breast, pain in left breast, and anxiety. She denies chest pain, dysuria, nausea, vomiting, rash, and any other symptoms.    PHYSICAL EXAM:   Vitals with BMI 12/04/2019  Height 5' 1"  Weight 174 lbs 10 oz  BMI 83.38  Systolic 250  Diastolic 95  Pulse 539   Lungs are clear to auscultation bilaterally. Heart has regular rate and rhythm. No palpable cervical, supraclavicular, or axillary adenopathy. Abdomen soft, non-tender, normal bowel sounds. Right breast with biopsy site in the lateral aspect. No palpable mass, nipple discharge, or bleeding.  Left breast with biopsy site in the upper outer quadrant. No palpable mass, nipple discharge, or bleeding.   KPS = 90  100 - Normal; no complaints; no evidence of disease. 90   - Able to carry on normal activity; minor signs or symptoms of disease. 80   - Normal activity with effort; some signs or symptoms of disease. 60   - Cares for self; unable to carry on normal activity or to do active work. 60   - Requires occasional assistance, but is able to care for most of his  personal needs. 50   - Requires considerable assistance and frequent medical care. 53   - Disabled; requires special care and assistance. 70   - Severely disabled; hospital admission is indicated although death not imminent. 16   - Very sick; hospital admission necessary; active supportive treatment necessary. 10   - Moribund; fatal processes progressing rapidly. 0     - Dead  Karnofsky DA, Abelmann Wales, Craver LS and Burchenal [facility] [phone]) The use of the nitrogen mustards in the palliative treatment of carcinoma: with particular reference to bronchogenic carcinoma Cancer 1 634-56  LABORATORY DATA:  Lab Results  Component Value Date   WBC 8.0 12/04/2019   HGB 12.7 12/04/2019   HCT 38.7 12/04/2019   MCV 79.3 (L) 12/04/2019   PLT 459 (H) 12/04/2019   Lab Results  Component Value Date   NA 136 12/04/2019   K 4.0 12/04/2019   CL 102 12/04/2019   CO2 29 12/04/2019   Lab Results  Component Value Date   ALT 24 12/04/2019   AST 19 12/04/2019   ALKPHOS 107 12/04/2019   BILITOT 0.4 12/04/2019    PULMONARY FUNCTION TEST:   Recent Review Flowsheet Data   There is no flowsheet data to display.     RADIOGRAPHY: US BREAST LTD UNI LEFT INC AXILLA  Result Date: 11/12/2019 CLINICAL DATA:  39 year old female with a palpable area of concern in the left breast. EXAM: DIGITAL DIAGNOSTIC BILATERAL MAMMOGRAM WITH TOMO AND CAD; ULTRASOUND LEFT BREAST LIMITED; ULTRASOUND RIGHT BREAST LIMITED COMPARISON:  None. ACR Breast Density Category c: The breast tissue is heterogeneously dense, which may obscure small masses. FINDINGS: There is an asymmetry involving the upper-outer right breast, possibly related to dense fibroglandular tissue. There is a possible abnormal lymph node in the right axilla. There are at least 3 adjacent irregular masses in the region of palpable concern in the upper-outer left breast, 1 of which appears spiculated, together measuring up to 5.2 cm. There is an additional oval mass in  the lower outer left breast measuring 1 point cm. Mammographic images were processed with CAD. Physical examination at site of palpable concern in the upper-outer left breast reveals a firm mass at the approximate 130 to 2 o'clock position. Targeted ultrasound of the right breast was performed demonstrating dense fibroglandular tissue involving  the upper-outer quadrant. Several cysts are seen at the 10 to 11 o'clock position. There is an oval circumscribed hypoechoic mass at 10 o'clock 6 cm from nipple measuring 1.5 x 0.9 x 1.8 cm. This demonstrates imaging features suggestive of a fibroadenoma. No lymphadenopathy seen in the right axilla. Targeted ultrasound of the upper-outer left breast was performed. There is an irregular hypoechoic mass at 2 o'clock 7 cm from nipple measuring 1.5 x 1.7 x 1.5 cm. An adjacent smaller irregular mass also at 2 o'clock 7 cm from nipple measures 0.8 x 0.7 x 0.7 cm with these 2 masses together measuring up to 2.8 cm. There is an additional irregular hypoechoic mass at the 2:30 position 7 cm from nipple measuring 0.9 x 0.8 x 1.4 cm. These 3 masses correspond with the irregular masses seen in the upper-outer left breast at mammography. There is a cluster of cysts in the left breast at 3 o'clock 9 cm from nipple measuring 1 x 0.6 x 0.6 cm. This is felt to correspond with and the additional oval mass seen in the lower outer left breast at mammography. An additional cyst at 3 o'clock 7 cm from nipple measures 0.8 x 0.7 x 0.9 cm. There is no definite left axillary lymphadenopathy, with the lymph nodes in the left axilla similar in appearance when compared to the right side. IMPRESSION: 1. Three highly suspicious left breast masses, all together measuring up to 5.2 cm mammographically. 2. Indeterminate 1.5 cm mass in the right breast, demonstrating imaging features suggestive of a fibroadenoma. 3.  No axillary lymphadenopathy. 4.  Bilateral breast cysts. RECOMMENDATION: 1. Recommend  ultrasound-guided biopsy of 2 of the highly suspicious masses which are furthest apart from each other, felt to be the 2 largest masses. 2. Recommend ultrasound-guided biopsy of the 1.5 cm mass in the right breast at the 10 o'clock position. I have discussed the findings and recommendations with the patient. If applicable, a reminder letter will be sent to the patient regarding the next appointment. BI-RADS CATEGORY  5: Highly suggestive of malignancy. Electronically Signed   By: Everlean Alstrom M.D.   On: 11/12/2019 12:09   US BREAST LTD UNI RIGHT INC AXILLA  Result Date: 11/12/2019 CLINICAL DATA:  39 year old female with a palpable area of concern in the left breast. EXAM: DIGITAL DIAGNOSTIC BILATERAL MAMMOGRAM WITH TOMO AND CAD; ULTRASOUND LEFT BREAST LIMITED; ULTRASOUND RIGHT BREAST LIMITED COMPARISON:  None. ACR Breast Density Category c: The breast tissue is heterogeneously dense, which may obscure small masses. FINDINGS: There is an asymmetry involving the upper-outer right breast, possibly related to dense fibroglandular tissue. There is a possible abnormal lymph node in the right axilla. There are at least 3 adjacent irregular masses in the region of palpable concern in the upper-outer left breast, 1 of which appears spiculated, together measuring up to 5.2 cm. There is an additional oval mass in the lower outer left breast measuring 1 point cm. Mammographic images were processed with CAD. Physical examination at site of palpable concern in the upper-outer left breast reveals a firm mass at the approximate 130 to 2 o'clock position. Targeted ultrasound of the right breast was performed demonstrating dense fibroglandular tissue involving the upper-outer quadrant. Several cysts are seen at the 10 to 11 o'clock position. There is an oval circumscribed hypoechoic mass at 10 o'clock 6 cm from nipple measuring 1.5 x 0.9 x 1.8 cm. This demonstrates imaging features suggestive of a fibroadenoma. No  lymphadenopathy seen in the right axilla. Targeted ultrasound of  the upper-outer left breast was performed. There is an irregular hypoechoic mass at 2 o'clock 7 cm from nipple measuring 1.5 x 1.7 x 1.5 cm. An adjacent smaller irregular mass also at 2 o'clock 7 cm from nipple measures 0.8 x 0.7 x 0.7 cm with these 2 masses together measuring up to 2.8 cm. There is an additional irregular hypoechoic mass at the 2:30 position 7 cm from nipple measuring 0.9 x 0.8 x 1.4 cm. These 3 masses correspond with the irregular masses seen in the upper-outer left breast at mammography. There is a cluster of cysts in the left breast at 3 o'clock 9 cm from nipple measuring 1 x 0.6 x 0.6 cm. This is felt to correspond with and the additional oval mass seen in the lower outer left breast at mammography. An additional cyst at 3 o'clock 7 cm from nipple measures 0.8 x 0.7 x 0.9 cm. There is no definite left axillary lymphadenopathy, with the lymph nodes in the left axilla similar in appearance when compared to the right side. IMPRESSION: 1. Three highly suspicious left breast masses, all together measuring up to 5.2 cm mammographically. 2. Indeterminate 1.5 cm mass in the right breast, demonstrating imaging features suggestive of a fibroadenoma. 3.  No axillary lymphadenopathy. 4.  Bilateral breast cysts. RECOMMENDATION: 1. Recommend ultrasound-guided biopsy of 2 of the highly suspicious masses which are furthest apart from each other, felt to be the 2 largest masses. 2. Recommend ultrasound-guided biopsy of the 1.5 cm mass in the right breast at the 10 o'clock position. I have discussed the findings and recommendations with the patient. If applicable, a reminder letter will be sent to the patient regarding the next appointment. BI-RADS CATEGORY  5: Highly suggestive of malignancy. Electronically Signed   By: Everlean Alstrom M.D.   On: 11/12/2019 12:09   MM DIAG BREAST TOMO BILATERAL  Result Date: 11/12/2019 CLINICAL DATA:   39 year old female with a palpable area of concern in the left breast. EXAM: DIGITAL DIAGNOSTIC BILATERAL MAMMOGRAM WITH TOMO AND CAD; ULTRASOUND LEFT BREAST LIMITED; ULTRASOUND RIGHT BREAST LIMITED COMPARISON:  None. ACR Breast Density Category c: The breast tissue is heterogeneously dense, which may obscure small masses. FINDINGS: There is an asymmetry involving the upper-outer right breast, possibly related to dense fibroglandular tissue. There is a possible abnormal lymph node in the right axilla. There are at least 3 adjacent irregular masses in the region of palpable concern in the upper-outer left breast, 1 of which appears spiculated, together measuring up to 5.2 cm. There is an additional oval mass in the lower outer left breast measuring 1 point cm. Mammographic images were processed with CAD. Physical examination at site of palpable concern in the upper-outer left breast reveals a firm mass at the approximate 130 to 2 o'clock position. Targeted ultrasound of the right breast was performed demonstrating dense fibroglandular tissue involving the upper-outer quadrant. Several cysts are seen at the 10 to 11 o'clock position. There is an oval circumscribed hypoechoic mass at 10 o'clock 6 cm from nipple measuring 1.5 x 0.9 x 1.8 cm. This demonstrates imaging features suggestive of a fibroadenoma. No lymphadenopathy seen in the right axilla. Targeted ultrasound of the upper-outer left breast was performed. There is an irregular hypoechoic mass at 2 o'clock 7 cm from nipple measuring 1.5 x 1.7 x 1.5 cm. An adjacent smaller irregular mass also at 2 o'clock 7 cm from nipple measures 0.8 x 0.7 x 0.7 cm with these 2 masses together measuring up to 2.8 cm.  There is an additional irregular hypoechoic mass at the 2:30 position 7 cm from nipple measuring 0.9 x 0.8 x 1.4 cm. These 3 masses correspond with the irregular masses seen in the upper-outer left breast at mammography. There is a cluster of cysts in the left  breast at 3 o'clock 9 cm from nipple measuring 1 x 0.6 x 0.6 cm. This is felt to correspond with and the additional oval mass seen in the lower outer left breast at mammography. An additional cyst at 3 o'clock 7 cm from nipple measures 0.8 x 0.7 x 0.9 cm. There is no definite left axillary lymphadenopathy, with the lymph nodes in the left axilla similar in appearance when compared to the right side. IMPRESSION: 1. Three highly suspicious left breast masses, all together measuring up to 5.2 cm mammographically. 2. Indeterminate 1.5 cm mass in the right breast, demonstrating imaging features suggestive of a fibroadenoma. 3.  No axillary lymphadenopathy. 4.  Bilateral breast cysts. RECOMMENDATION: 1. Recommend ultrasound-guided biopsy of 2 of the highly suspicious masses which are furthest apart from each other, felt to be the 2 largest masses. 2. Recommend ultrasound-guided biopsy of the 1.5 cm mass in the right breast at the 10 o'clock position. I have discussed the findings and recommendations with the patient. If applicable, a reminder letter will be sent to the patient regarding the next appointment. BI-RADS CATEGORY  5: Highly suggestive of malignancy. Electronically Signed   By: Everlean Alstrom M.D.   On: 11/12/2019 12:09   MM CLIP PLACEMENT LEFT  Result Date: 11/22/2019 CLINICAL DATA:  Status post bilateral breast biopsies (2 masses on the LEFT and 1 mass on the RIGHT) EXAM: DIAGNOSTIC BILATERAL MAMMOGRAM POST ULTRASOUND BIOPSY x3 COMPARISON:  Previous exam(s). FINDINGS: Mammographic images were obtained the LEFT following ultrasound guided biopsy of masses within the LEFT breast at the 2:30 o'clock axis and 2 o'clock axis. Mammographic images were obtained of the RIGHT breast following ultrasound-guided biopsy a mass in the RIGHT breast at the 10 o'clock axis. The 2 biopsy marking clips within the LEFT breast are in expected positions at the site of biopsies in the upper outer quadrant. The biopsy marker  clip within the RIGHT breast is in expected position at the site of the biopsy in the upper-outer quadrant of the RIGHT breast. IMPRESSION: 1. Appropriate positioning of the COIL shaped biopsy marking clip at the site of biopsy in the upper-outer quadrant of the LEFT breast, corresponding to the targeted mass at the 2:30 o'clock axis. 2. Appropriate positioning of the HEART shaped biopsy marking clip at the site of biopsy in the upper-outer quadrant of the LEFT breast, corresponding to the targeted mass at the 2 o'clock axis. 3. Appropriate positioning of the COIL shaped biopsy marking clip at the site of biopsy in the upper-outer quadrant of the RIGHT breast, corresponding to the targeted mass at the 10 o'clock axis. Final Assessment: Post Procedure Mammograms for Marker Placement Electronically Signed   By: Franki Cabot M.D.   On: 11/22/2019 12:02   MM CLIP PLACEMENT RIGHT  Result Date: 11/22/2019 CLINICAL DATA:  Status post bilateral breast biopsies (2 masses on the LEFT and 1 mass on the RIGHT) EXAM: DIAGNOSTIC BILATERAL MAMMOGRAM POST ULTRASOUND BIOPSY x3 COMPARISON:  Previous exam(s). FINDINGS: Mammographic images were obtained the LEFT following ultrasound guided biopsy of masses within the LEFT breast at the 2:30 o'clock axis and 2 o'clock axis. Mammographic images were obtained of the RIGHT breast following ultrasound-guided biopsy a mass in the  RIGHT breast at the 10 o'clock axis. The 2 biopsy marking clips within the LEFT breast are in expected positions at the site of biopsies in the upper outer quadrant. The biopsy marker clip within the RIGHT breast is in expected position at the site of the biopsy in the upper-outer quadrant of the RIGHT breast. IMPRESSION: 1. Appropriate positioning of the COIL shaped biopsy marking clip at the site of biopsy in the upper-outer quadrant of the LEFT breast, corresponding to the targeted mass at the 2:30 o'clock axis. 2. Appropriate positioning of the HEART shaped  biopsy marking clip at the site of biopsy in the upper-outer quadrant of the LEFT breast, corresponding to the targeted mass at the 2 o'clock axis. 3. Appropriate positioning of the COIL shaped biopsy marking clip at the site of biopsy in the upper-outer quadrant of the RIGHT breast, corresponding to the targeted mass at the 10 o'clock axis. Final Assessment: Post Procedure Mammograms for Marker Placement Electronically Signed   By: Franki Cabot M.D.   On: 11/22/2019 12:02   [place] LT BREAST BX W LOC DEV 1ST LESION IMG BX SPEC US GUIDE  Addendum Date: 12/03/2019   ADDENDUM REPORT: 11/26/2019 10:58 ADDENDUM: Pathology revealed ATYPICAL DUCTAL HYPERPLASIA AND ATYPICAL LOBULAR HYPERPLASIA INVOLVING FIBROADENOMA of the LEFT breast, 2:30 o'clock. This was found to be concordant by Dr. Franki Cabot, with excision recommended. Pathology revealed GRADE III INVASIVE DUCTAL CARCINOMA of the LEFT breast, 2 o'clock. This was found to be concordant by Dr. Franki Cabot. 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 This was found to be concordant by Dr. Franki Cabot. Pathology results were discussed with the patient by telephone. The patient reported doing well after the biopsies with tenderness at the sites. Post biopsy instructions and care were reviewed and questions were answered. The patient was encouraged to call [facility] for any additional concerns. My direct phone number was provided. The patient was referred to [facility] on December 04, 2019. Recommendation for a bilateral breast MRI given heterogeneously dense breasts and age. Pathology results reported by Terie Purser, RN on 11/26/2019. Electronically Signed   By: Franki Cabot M.D.   On: 11/26/2019 10:58   Result Date: 12/03/2019 CLINICAL DATA:  Patient presents today for ultrasound-guided biopsy of 2  masses in the LEFT breast and 1 mass in the RIGHT breast. EXAM: ULTRASOUND GUIDED LEFT BREAST CORE NEEDLE BIOPSY x2 ULTRASOUND GUIDED RIGHT BREAST CORE NEEDLE BIOPSY x1 COMPARISON:  Previous exam(s). PROCEDURE: I met with the patient and we discussed the procedure of ultrasound-guided biopsy, including benefits and alternatives. We discussed the high likelihood of a successful procedure. We discussed the risks of the procedure, including infection, bleeding, tissue injury, clip migration, and inadequate sampling. Informed written consent was given. The usual time-out protocol was performed immediately prior to the procedure. LEFT BREAST: Site 1: Lesion quadrant: Upper outer quadrant Using sterile technique and 1% Lidocaine as local anesthetic, under direct ultrasound visualization, a 12 gauge spring-loaded device was used to perform biopsy of the LEFT breast mass at the 2:30 o'clock axis using a lateral approach. At the conclusion of the procedure coil shaped tissue marker clip was deployed into the biopsy cavity. Site 2: Lesion quadrant: Upper outer quadrant Using sterile technique and 1% Lidocaine as local anesthetic, under direct ultrasound visualization, a 12 gauge spring-loaded device was used to perform biopsy of the LEFT breast mass  at the 2 o'clock axis using a lateral approach. At the conclusion of the procedure heart shaped tissue marker clip was deployed into the biopsy cavity. Follow up 2 view mammogram was performed and dictated separately. RIGHT BREAST: Site 3: Lesion quadrant: Upper outer quadrant Using sterile technique and 1% Lidocaine as local anesthetic, under direct ultrasound visualization, a 12 gauge spring-loaded device was used to perform biopsy of the RIGHT breast mass at the 10 o'clock axis using a lateral approach. At the conclusion of the procedure coil shaped tissue marker clip was deployed into the biopsy cavity. Follow up 2 view mammogram was performed and dictated separately.  IMPRESSION: 1. Ultrasound guided biopsy of the LEFT breast mass at the 2:30 o'clock axis. No apparent complications. 2. Ultrasound guided biopsy of the LEFT breast mass at the 2 o'clock axis. No apparent complications. 3. Ultrasound guided biopsy of the RIGHT breast mass at the 10 o'clock axis. No apparent complications. Electronically Signed: By: Franki Cabot M.D. On: 11/22/2019 11:35   [place] LT BREAST BX W LOC DEV EA ADD LESION IMG BX SPEC US GUIDE  Addendum Date: 12/03/2019   ADDENDUM REPORT: 11/26/2019 10:58 ADDENDUM: Pathology revealed ATYPICAL DUCTAL HYPERPLASIA AND ATYPICAL LOBULAR HYPERPLASIA INVOLVING FIBROADENOMA of the LEFT breast, 2:30 o'clock. This was found to be concordant by Dr. Franki Cabot, with excision recommended. Pathology revealed GRADE III INVASIVE DUCTAL CARCINOMA of the LEFT breast, 2 o'clock. This was found to be concordant by Dr. Franki Cabot. 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 This was found to be concordant by Dr. Franki Cabot. Pathology results were discussed with the patient by telephone. The patient reported doing well after the biopsies with tenderness at the sites. Post biopsy instructions and care were reviewed and questions were answered. The patient was encouraged to call [facility] for any additional concerns. My direct phone number was provided. The patient was referred to [facility] on December 04, 2019. Recommendation for a bilateral breast MRI given heterogeneously dense breasts and age. Pathology results reported by Terie Purser, RN on 11/26/2019. Electronically Signed   By: Franki Cabot M.D.   On: 11/26/2019 10:58   Result Date: 12/03/2019 CLINICAL DATA:  Patient presents today for ultrasound-guided biopsy of 2 masses in the LEFT breast and 1 mass in the RIGHT breast. EXAM: ULTRASOUND GUIDED  LEFT BREAST CORE NEEDLE BIOPSY x2 ULTRASOUND GUIDED RIGHT BREAST CORE NEEDLE BIOPSY x1 COMPARISON:  Previous exam(s). PROCEDURE: I met with the patient and we discussed the procedure of ultrasound-guided biopsy, including benefits and alternatives. We discussed the high likelihood of a successful procedure. We discussed the risks of the procedure, including infection, bleeding, tissue injury, clip migration, and inadequate sampling. Informed written consent was given. The usual time-out protocol was performed immediately prior to the procedure. LEFT BREAST: Site 1: Lesion quadrant: Upper outer quadrant Using sterile technique and 1% Lidocaine as local anesthetic, under direct ultrasound visualization, a 12 gauge spring-loaded device was used to perform biopsy of the LEFT breast mass at the 2:30 o'clock axis using a lateral approach. At the conclusion of the procedure coil shaped tissue marker clip was deployed into the biopsy cavity. Site 2: Lesion quadrant: Upper outer quadrant Using sterile technique and 1% Lidocaine as local anesthetic, under direct ultrasound visualization, a 12 gauge spring-loaded device was used to perform biopsy of the LEFT breast mass at the 2 o'clock axis using  a lateral approach. At the conclusion of the procedure heart shaped tissue marker clip was deployed into the biopsy cavity. Follow up 2 view mammogram was performed and dictated separately. RIGHT BREAST: Site 3: Lesion quadrant: Upper outer quadrant Using sterile technique and 1% Lidocaine as local anesthetic, under direct ultrasound visualization, a 12 gauge spring-loaded device was used to perform biopsy of the RIGHT breast mass at the 10 o'clock axis using a lateral approach. At the conclusion of the procedure coil shaped tissue marker clip was deployed into the biopsy cavity. Follow up 2 view mammogram was performed and dictated separately. IMPRESSION: 1. Ultrasound guided biopsy of the LEFT breast mass at the 2:30 o'clock axis.  No apparent complications. 2. Ultrasound guided biopsy of the LEFT breast mass at the 2 o'clock axis. No apparent complications. 3. Ultrasound guided biopsy of the RIGHT breast mass at the 10 o'clock axis. No apparent complications. Electronically Signed: By: Franki Cabot M.D. On: 11/22/2019 11:35   [place] RT BREAST BX W LOC DEV 1ST LESION IMG BX SPEC US GUIDE  Addendum Date: 12/03/2019   ADDENDUM REPORT: 11/26/2019 10:58 ADDENDUM: Pathology revealed ATYPICAL DUCTAL HYPERPLASIA AND ATYPICAL LOBULAR HYPERPLASIA INVOLVING FIBROADENOMA of the LEFT breast, 2:30 o'clock. This was found to be concordant by Dr. Franki Cabot, with excision recommended. Pathology revealed GRADE III INVASIVE DUCTAL CARCINOMA of the LEFT breast, 2 o'clock. This was found to be concordant by Dr. Franki Cabot. 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 This was found to be concordant by Dr. Franki Cabot. Pathology results were discussed with the patient by telephone. The patient reported doing well after the biopsies with tenderness at the sites. Post biopsy instructions and care were reviewed and questions were answered. The patient was encouraged to call [facility] for any additional concerns. My direct phone number was provided. The patient was referred to [facility] on December 04, 2019. Recommendation for a bilateral breast MRI given heterogeneously dense breasts and age. Pathology results reported by Terie Purser, RN on 11/26/2019. Electronically Signed   By: Franki Cabot M.D.   On: 11/26/2019 10:58   Result Date: 12/03/2019 CLINICAL DATA:  Patient presents today for ultrasound-guided biopsy of 2 masses in the LEFT breast and 1 mass in the RIGHT breast. EXAM: ULTRASOUND GUIDED LEFT BREAST CORE NEEDLE BIOPSY x2 ULTRASOUND GUIDED RIGHT BREAST CORE NEEDLE BIOPSY x1  COMPARISON:  Previous exam(s). PROCEDURE: I met with the patient and we discussed the procedure of ultrasound-guided biopsy, including benefits and alternatives. We discussed the high likelihood of a successful procedure. We discussed the risks of the procedure, including infection, bleeding, tissue injury, clip migration, and inadequate sampling. Informed written consent was given. The usual time-out protocol was performed immediately prior to the procedure. LEFT BREAST: Site 1: Lesion quadrant: Upper outer quadrant Using sterile technique and 1% Lidocaine as local anesthetic, under direct ultrasound visualization, a 12 gauge spring-loaded device was used to perform biopsy of the LEFT breast mass at the 2:30 o'clock axis using a lateral approach. At the conclusion of the procedure coil shaped tissue marker clip was deployed into the biopsy cavity. Site 2: Lesion quadrant: Upper outer quadrant Using sterile technique and 1% Lidocaine as local anesthetic, under direct ultrasound visualization, a 12 gauge spring-loaded device was used to perform biopsy of the LEFT breast mass at the 2 o'clock axis using a lateral approach. At the conclusion of  the procedure heart shaped tissue marker clip was deployed into the biopsy cavity. Follow up 2 view mammogram was performed and dictated separately. RIGHT BREAST: Site 3: Lesion quadrant: Upper outer quadrant Using sterile technique and 1% Lidocaine as local anesthetic, under direct ultrasound visualization, a 12 gauge spring-loaded device was used to perform biopsy of the RIGHT breast mass at the 10 o'clock axis using a lateral approach. At the conclusion of the procedure coil shaped tissue marker clip was deployed into the biopsy cavity. Follow up 2 view mammogram was performed and dictated separately. IMPRESSION: 1. Ultrasound guided biopsy of the LEFT breast mass at the 2:30 o'clock axis. No apparent complications. 2. Ultrasound guided biopsy of the LEFT breast mass at the 2  o'clock axis. No apparent complications. 3. Ultrasound guided biopsy of the RIGHT breast mass at the 10 o'clock axis. No apparent complications. Electronically Signed: By: Franki Cabot M.D. On: 11/22/2019 11:35      IMPRESSION: Stage T1b, Nx, Mx, Left Breast UOQ, Invasive Ductal Carcinoma, ER- / PR- / Her2-, Grade 3   The patient would appear to be a good candidate for breast conservation with radiotherapy to the left breast depending on results of MRI. We discussed the general course of radiation, potential side effects, and toxicities with radiation and the patient is interested in this approach.   PLAN:  1. Genetics 2. MRI 3. Echocardiogram 4. Port-a-cath placement / chemotherapy class 5. Neoadjuvant chemotherapy 6. Surgery TBD 7. Adjuvant radiation therapy   ------------------------------------------------  Blair Promise, PhD, MD  This document serves as a record of services personally performed by [name], MD. It was created on his behalf by Clerance Lav, a trained medical scribe. The creation of this record is based on the scribe's personal observations and the provider's statements to them. This document has been checked and approved by the attending provider.

## 2019-12-04 NOTE — Progress Notes (Signed)
START OFF PATHWAY REGIMEN - Breast   [id number]:AC q21 Days C1-4 followed by Carboplatin AUC=6 D1 + Paclitaxel 80 mg/m2 D1,8,15 q21 Days C5-8:   A cycle is every 21 days:     Doxorubicin      Cyclophosphamide      Paclitaxel      Carboplatin   **Always confirm dose/schedule in your pharmacy ordering system**  [id number]:Pembrolizumab 200 mg IV D1 q21 Days:   A cycle is every 21 days:     Pembrolizumab   **Always confirm dose/schedule in your pharmacy ordering system**  Patient Characteristics: Preoperative or Nonsurgical Candidate (Clinical Staging), Neoadjuvant Therapy followed by Surgery, Invasive Disease, Chemotherapy, HER2 Negative/Unknown/Equivocal, ER Negative/Unknown, Platinum Therapy Indicated Therapeutic Status: Preoperative or Nonsurgical Candidate (Clinical Staging) AJCC M Category: cM0 AJCC Grade: G3 Breast Surgical Plan: Neoadjuvant Therapy followed by Surgery ER Status: Negative (-) AJCC 8 Stage Grouping: IB HER2 Status: Negative (-) AJCC T Category: cT1c AJCC N Category: cN0 PR Status: Negative (-) Type of Therapy: Platinum Therapy Indicated Intent of Therapy: Curative Intent, Discussed with Patient

## 2019-12-04 NOTE — Therapy (Signed)
[place], [place], [address] Phone: [phone]   Fax:  [phone]  Physical Therapy Evaluation  Patient Details  Name: Amanda Burns MRN: [id number] Date of Birth: 08-30-1980 Referring Provider (PT): Dr. [name]   Encounter Date: 12/04/2019   PT End of Session - 12/04/19 1303    Visit Number 1    Number of Visits 2    Date for PT Re-Evaluation 06/02/20    PT Start Time 1031    PT Stop Time 9179   Also saw pt from 1115-1137 for a total of 30 minutes   PT Time Calculation (min) 8 min    Activity Tolerance Patient tolerated treatment well    Behavior During Therapy [facility] for tasks assessed/performed           Past Medical History:  Diagnosis Date  . Breast cancer ([place])   . Gestational diabetes   . Gestational diabetes mellitus, currently pregnant   . Postpartum care following vaginal delivery (3/28) 06/08/2013    Past Surgical History:  Procedure Laterality Date  . FOOT SURGERY      There were no vitals filed for this visit.    Subjective Assessment - 12/04/19 1248    Subjective Patient reports she is here today to be seen by hre medical team for her newly diagnosed left breast cancer.    Patient is accompained by: Family member    Pertinent History Patient was diagnosed on 11/12/2019 with left triple negative grade III invasive ductal carcinoma breast cancer. There are 2 masses in the upper outer quadrant measuring 1.7 cm and 8 mm. Ki67 is 80%.    Patient Stated Goals Reduce lymphedema risk and learn post op shoulder ROM HEP    Currently in Pain? Yes    Pain Score 2     Pain Location Elbow    Pain Orientation Right    Pain Descriptors / Indicators Aching    Pain Type Acute pain    Pain Onset 1 to 4 weeks ago    Pain Frequency Intermittent    Aggravating Factors  Using right arm; she belives she has tennis elbow    Pain Relieving Factors Rest              [facility] PT Assessment -  12/04/19 0001      Assessment   Medical Diagnosis Left breast cancer    Referring Provider (PT) Dr. [name]    Onset Date/Surgical Date 11/12/19    Hand Dominance Right    Prior Therapy none      Precautions   Precautions Other (comment)    Precaution Comments active cancer      Restrictions   Weight Bearing Restrictions No      Balance Screen   Has the patient fallen in the past 6 months No    Has the patient had a decrease in activity level because of a fear of falling?  No    Is the patient reluctant to leave their home because of a fear of falling?  No      Home Environment   Living Environment Private residence    Living Arrangements Children   5 and 6 y.o. boys   Available Help at Discharge Family      Prior Function   Level of Independence Independent    Vocation Full time employment    Vocation Requirements She works for a [employer]; works from home on computer    Leisure  She does not exercise      Cognition   Overall Cognitive Status Within Functional Limits for tasks assessed      Posture/Postural Control   Posture/Postural Control Postural limitations    Postural Limitations Rounded Shoulders;Forward head      ROM / Strength   AROM / PROM / Strength AROM;Strength      AROM   Overall AROM Comments Cervical AROM is WNL    AROM Assessment Site Shoulder    Right/Left Shoulder Right;Left    Right Shoulder Extension 45 Degrees    Right Shoulder Flexion 159 Degrees    Right Shoulder ABduction 171 Degrees    Right Shoulder Internal Rotation 76 Degrees    Right Shoulder External Rotation 83 Degrees    Left Shoulder Extension 48 Degrees    Left Shoulder Flexion 156 Degrees    Left Shoulder ABduction 172 Degrees    Left Shoulder Internal Rotation 70 Degrees    Left Shoulder External Rotation 76 Degrees      Strength   Overall Strength Within functional limits for tasks performed             LYMPHEDEMA/ONCOLOGY QUESTIONNAIRE - 12/04/19 0001       Type   Cancer Type Left breast cancer      Lymphedema Assessments   Lymphedema Assessments Upper extremities      Right Upper Extremity Lymphedema   10 cm Proximal to Olecranon Process 29.8 cm    Olecranon Process 24.8 cm    10 cm Proximal to Ulnar Styloid Process 23.7 cm    Just Proximal to Ulnar Styloid Process 15.5 cm    Across Hand at [employer] 18.4 cm    At [place] of 2nd Digit 6.1 cm      Left Upper Extremity Lymphedema   10 cm Proximal to Olecranon Process 30.4 cm    Olecranon Process 24.6 cm    10 cm Proximal to Ulnar Styloid Process 22.2 cm    Just Proximal to Ulnar Styloid Process 15 cm    Across Hand at [employer] 18.5 cm    At [place] of 2nd Digit 5.8 cm           L-DEX FLOWSHEETS - 12/04/19 1300      L-DEX LYMPHEDEMA SCREENING   Measurement Type Unilateral    L-DEX MEASUREMENT EXTREMITY Upper Extremity    POSITION  Standing    DOMINANT SIDE Right    At Risk Side Left    BASELINE SCORE (UNILATERAL) 1.1           The patient was assessed using the L-Dex machine today to produce a lymphedema index baseline score. The patient will be reassessed on a regular basis (typically every 3 months) to obtain new L-Dex scores. If the score is > 6.5 points away from his/her baseline score indicating onset of subclinical lymphedema, it will be recommended to wear a compression garment for 4 weeks, 12 hours per day and then be reassessed. If the score continues to be > 6.5 points from baseline at reassessment, we will initiate lymphedema treatment. Assessing in this manner has a 95% rate of preventing clinically significant lymphedema.      Katina Dung - 12/04/19 0001    Open a tight or new jar Severe difficulty    Do heavy household chores (wash walls, wash floors) Moderate difficulty    Carry a shopping bag or briefcase Moderate difficulty    Wash your back Mild difficulty  Use a knife to cut food No difficulty    Recreational activities in which you take  some force or impact through your arm, shoulder, or hand (golf, hammering, tennis) Unable    During the past week, to what extent has your arm, shoulder or hand problem interfered with your normal social activities with family, friends, neighbors, or groups? Not at all    During the past week, to what extent has your arm, shoulder or hand problem limited your work or other regular daily activities Slightly    Arm, shoulder, or hand pain. Moderate    Tingling (pins and needles) in your arm, shoulder, or hand None    Difficulty Sleeping No difficulty    DASH Score 34.09 %            Objective measurements completed on examination: See above findings.       Patient was instructed today in a home exercise program today for post op shoulder range of motion. These included active assist shoulder flexion in sitting, scapular retraction, wall walking with shoulder abduction, and hands behind head external rotation.  She was encouraged to do these twice a day, holding 3 seconds and repeating 5 times when permitted by her physician.            PT Education - 12/04/19 1302    Education Details Lymphedema risk reduction and post op shoulder ROM HEP    Person(s) Educated Patient;Parent(s)    Methods Explanation;Demonstration;Handout    Comprehension Returned demonstration;Verbalized understanding               PT Long Term Goals - 12/04/19 1307      PT LONG TERM GOAL #1   Title Patient will demonstrate she has regained full shoulder ROM and function post operatively compared to baselines.    Time 6    Period Months    Status New    Target Date 06/02/20           Breast Clinic Goals - 12/04/19 1306      Patient will be able to verbalize understanding of pertinent lymphedema risk reduction practices relevant to her diagnosis specifically related to skin care.   Time 1    Period Days    Status Achieved      Patient will be able to return demonstrate and/or verbalize  understanding of the post-op home exercise program related to regaining shoulder range of motion.   Time 1    Period Days    Status Achieved      Patient will be able to verbalize understanding of the importance of attending the postoperative After Breast Cancer Class for further lymphedema risk reduction education and therapeutic exercise.   Time 1    Period Days    Status Achieved                 Plan - 12/04/19 1303    Clinical Impression Statement Patient was diagnosed on 11/12/2019 with left triple negative grade III invasive ductal carcinoma breast cancer. There are 2 masses in the upper outer quadrant measuring 1.7 cm and 8 mm. Ki67 is 80%. Her multidisciplinary medical team met prior to her assessments to determine a recommended treatment plan. She is planning to have neoadjuvant chemotherapy followed by a left lumpectomy and sentinel node biopsy, radiation, and anti-estrogen therapy. She will benefit from a post op PT reassessment to determine needs and from L-Dex screens every 3 months to detect subclinical lymphedema.    Stability/Clinical Decision  Making Stable/Uncomplicated    Rehab Potential Excellent    PT Frequency --   Eval and 1 f/u visit   PT Treatment/Interventions ADLs/Self Care Home Management;Therapeutic exercise;Patient/family education    PT Next Visit Plan Will reassess 3-4 weeks post op to determine needs    PT Home Exercise Plan Post op shoulder ROM HEP    Consulted and Agree with Plan of Care Patient;Family member/caregiver    Family Member Consulted mom           Patient will benefit from skilled therapeutic intervention in order to improve the following deficits and impairments:  Postural dysfunction, Decreased range of motion, Decreased knowledge of precautions, Impaired UE functional use, Pain  Visit Diagnosis: Malignant neoplasm of upper-outer quadrant of left breast in female, estrogen receptor negative ([place]) - Plan: PT plan of care  cert/re-cert  Abnormal posture - Plan: PT plan of care cert/re-cert   Patient will follow up at outpatient cancer rehab 3-4 weeks following surgery.  If the patient requires physical therapy at that time, a specific plan will be dictated and sent to the referring physician for approval. The patient was educated today on appropriate basic range of motion exercises to begin post operatively and the importance of attending the After Breast Cancer class following surgery.  Patient was educated today on lymphedema risk reduction practices as it pertains to recommendations that will benefit the patient immediately following surgery.  She verbalized good understanding.     Problem List Patient Active Problem List   Diagnosis Date Noted  . Malignant neoplasm of upper-outer quadrant of left breast in female, estrogen receptor negative ([place]) 11/29/2019  . Normal labor 10/01/2014  . Postpartum care following vaginal delivery (3/28) 06/08/2013  . Pregnancy 06/07/2013   Annia Friendly, PT 12/04/19 1:11 PM  [place], [place], [address] Phone: [phone]   Fax:  [phone]  Name: Amanda Burns MRN: [id number] Date of Birth: 07-May-1980

## 2019-12-04 NOTE — Telephone Encounter (Signed)
Left message on patient's voicemail that she had left her vaccination card here today and that I would leave it with the education nurse for her to pick up on 9/28 at her patient education appointment.

## 2019-12-04 NOTE — Patient Instructions (Signed)

## 2019-12-05 ENCOUNTER — Telehealth: Payer: Self-pay | Admitting: Hematology and Oncology

## 2019-12-05 ENCOUNTER — Encounter: Payer: Self-pay | Admitting: Genetic Counselor

## 2019-12-05 DIAGNOSIS — Z803 Family history of malignant neoplasm of breast: Secondary | ICD-10-CM | POA: Insufficient documentation

## 2019-12-05 DIAGNOSIS — Z808 Family history of malignant neoplasm of other organs or systems: Secondary | ICD-10-CM | POA: Insufficient documentation

## 2019-12-05 DIAGNOSIS — Z800 Family history of malignant neoplasm of digestive organs: Secondary | ICD-10-CM | POA: Insufficient documentation

## 2019-12-05 NOTE — Telephone Encounter (Signed)
No 9/22 los, no changes made to pt schedule   

## 2019-12-05 NOTE — Progress Notes (Signed)
REFERRING PROVIDER: [name], MD [place],  [place] [address]  PRIMARY PROVIDER:  Pcp, No  PRIMARY REASON FOR VISIT:  1. Malignant neoplasm of upper-outer quadrant of left breast in female, estrogen receptor negative ([place])   2. Family history of breast cancer   3. Family history of colon cancer   4. Family history of adrenal cancer   5. Family history of throat cancer      I connected with Amanda Burns on 12/04/2019 at 12:00 pm EDT by video conference and verified that I am speaking with the correct person using two identifiers.   Patient location: [facility] clinic Provider location: [facility] office  HISTORY OF PRESENT ILLNESS:   Amanda Burns, a 39 y.o. female, was seen for a  cancer genetics consultation at the request of [name] due to a personal and family history of cancer.  Amanda Burns presents to clinic today to discuss the possibility of a hereditary predisposition to cancer, genetic testing, and to further clarify her future cancer risks, as well as potential cancer risks for family members.   In 2021, at the age of 2, Amanda Burns was diagnosed with triple negative invasive ductal carcinoma of the left breast. The treatment plan includes neoadjuvant chemotherapy, surgery, and radiation therapy.   CANCER HISTORY:  Oncology History  Malignant neoplasm of upper-outer quadrant of left breast in female, estrogen receptor negative ([place])  11/29/2019 Initial Diagnosis   Patient palpated an area of concern in the left breast. Diagnostic mammogram and US showed a 1.8cm mass in the upper outer right breast representing a fibroadenoma, and in the left breast, three adjacent masses at the 2-2:30 position, 1.7cm, 0.8cm, and 1.4cm, with several cysts and no axillary adenopathy. Biopsy showed a benign fibroadenoma in the right breast, and in the left breast, IDC at the 2:30 position, grade 3, HER-2 negative (1+), ER+ 0%, PR+ 0%, Ki67 80%.    12/04/2019 Cancer Staging     Staging form: Breast, AJCC 8th Edition - Clinical: Stage IB (cT1c, cN0, cM0, G3, ER-, PR-, HER2-) - Signed by [name], MD on 12/04/2019   12/19/2019 -  Chemotherapy   The patient had dexamethasone (DECADRON) 4 MG tablet, 1 of 1 cycle, Start date: 12/04/2019, End date: -- DOXOrubicin (ADRIAMYCIN) chemo injection 112 mg, 60 mg/m2 = 112 mg, Intravenous,  Once, 0 of 4 cycles palonosetron (ALOXI) injection 0.25 mg, 0.25 mg, Intravenous,  Once, 0 of 8 cycles pegfilgrastim-jmdb (FULPHILA) injection 6 mg, 6 mg, Subcutaneous,  Once, 0 of 4 cycles CARBOplatin (PARAPLATIN) 700 mg in sodium chloride 0.9 % 250 mL chemo infusion, 700 mg (100 % of original dose 700 mg), Intravenous,  Once, 0 of 4 cycles Dose modification: 700 mg (original dose 700 mg, Cycle 5) cyclophosphamide (CYTOXAN) 1,120 mg in sodium chloride 0.9 % 250 mL chemo infusion, 600 mg/m2 = 1,120 mg, Intravenous,  Once, 0 of 4 cycles PACLitaxel (TAXOL) 150 mg in sodium chloride 0.9 % 250 mL chemo infusion (</= $RemoveBefor'80mg'YkVwwJtvFZeJ$ /m2), 80 mg/m2 = 150 mg, Intravenous,  Once, 0 of 4 cycles fosaprepitant (EMEND) 150 mg in sodium chloride 0.9 % 145 mL IVPB, 150 mg, Intravenous,  Once, 0 of 8 cycles pembrolizumab (KEYTRUDA) 200 mg in sodium chloride 0.9 % 50 mL chemo infusion, 200 mg (100 % of original dose 200 mg), Intravenous, Once, 0 of 8 cycles Dose modification: 200 mg (original dose 200 mg, Cycle 1, Reason: Provider Judgment), 200 mg (original dose 200 mg, Cycle 5, Reason: Provider Judgment)  for chemotherapy treatment.      RISK FACTORS:  Menarche was at age 27.  First live birth at age 47.  OCP use for approximately 8 years.  Ovaries intact: yes.  Hysterectomy: no.  Menopausal status: premenopausal.  HRT use: 0 years. Colonoscopy: no; never. Mammogram within the last year: yes.   Past Medical History:  Diagnosis Date  . Breast cancer ([place])   . Family history of adrenal cancer   . Family history of breast cancer   . Family history of colon  cancer   . Family history of throat cancer   . Gestational diabetes   . Gestational diabetes mellitus, currently pregnant   . Postpartum care following vaginal delivery (3/28) 06/08/2013    Past Surgical History:  Procedure Laterality Date  . FOOT SURGERY      Social History   Socioeconomic History  . Marital status: Single    Spouse name: Not on file  . Number of children: Not on file  . Years of education: Not on file  . Highest education level: Not on file  Occupational History  . Not on file  Tobacco Use  . Smoking status: Never Smoker  . Smokeless tobacco: Never Used  Substance and Sexual Activity  . Alcohol use: No  . Drug use: No  . Sexual activity: Yes    Birth control/protection: None  Other Topics Concern  . Not on file  Social History Narrative  . Not on file   Social Determinants of Health   Financial Resource Strain:   . Difficulty of Paying Living Expenses: Not on file  Food Insecurity:   . Worried About Charity fundraiser in the Last Year: Not on file  . Ran Out of Food in the Last Year: Not on file  Transportation Needs:   . Lack of Transportation (Medical): Not on file  . Lack of Transportation (Non-Medical): Not on file  Physical Activity:   . Days of Exercise per Week: Not on file  . Minutes of Exercise per Session: Not on file  Stress:   . Feeling of Stress : Not on file  Social Connections:   . Frequency of Communication with Friends and Family: Not on file  . Frequency of Social Gatherings with Friends and Family: Not on file  . Attends Religious Services: Not on file  . Active Member of Clubs or Organizations: Not on file  . Attends Archivist Meetings: Not on file  . Marital Status: Not on file     FAMILY HISTORY:  We obtained a detailed, 4-generation family history.  Significant diagnoses are listed below: Family History  Problem Relation Age of Onset  . Breast cancer Other        maternal great-aunt (MGF's sister), dx  in her 52s  . Breast cancer Maternal Grandmother 38  . Colon cancer Maternal Grandmother 83  . Cancer Maternal Grandmother 41       Adrenal cancer  . Breast cancer Paternal Aunt        dx. in her 40s  . Throat cancer Paternal Aunt        dx. in her 58s, non-smoker   Amanda Burns has two sons, ages 51 and 90. She has one brother who is 72 and has two daughters. None of these family members have had cancer.  Amanda Burns mother is 70 and has not had cancer. Amanda Burns has one maternal aunt and one maternal uncle. Her aunt had radiation treatment for thyroid  problems at the age of 53, but Amanda Burns is unsure if these was a cancer diagnosis. Her maternal grandmother died at the age of 69 and had a history of breast cancer (diagnosed age 98), colon cancer (diagnosed age 72), and adrenal cancer (diagnosed age 10). These were separate primaries. Her maternal grandfather is 19 and has not had cancer. One of her maternal grandfather's sisters was diagnosed with breast cancer in her 41s .  Amanda Burns's father is 15 and has not had cancer. She has four paternal aunts and two paternal uncles. One aunt was diagnosed with breast cancer in her 13s. A second aunt was diagnosed with throat cancer in her 70s and was not a smoker. Her paternal grandmother is in her 22s and does not have cancer, and her paternal grandfather died in his late 38s without cancer.  Amanda Burns is [place] of previous family history of genetic testing for hereditary cancer risks. Patient's maternal ancestors are of [place] and [place] descent, and paternal ancestors are of unknown descent. There is no reported Ashkenazi Jewish ancestry. There is no known consanguinity.  GENETIC COUNSELING ASSESSMENT: Amanda Burns is a 39 y.o. female with a personal and family history of young-onset breast cancer, colon cancer, and adrenal cancer, which is somewhat suggestive of a hereditary cancer syndrome and predisposition to cancer. We, therefore, discussed and  recommended the following at today's visit.   DISCUSSION: We discussed that approximately 5-10% of breast cancer is hereditary, with most cases associated with the BRCA1 and BRCA2 genes. There are other genes that can be associated with hereditary breast cancer syndromes. These include ATM, CHEK2, PALB2, etc. We discussed that testing is beneficial for several reasons, including knowing about other cancer risks, identifying potential screening and risk-reduction options that may be appropriate, and to understand if other family members could be at risk for cancer and allow them to undergo genetic testing.   We reviewed the characteristics, features and inheritance patterns of hereditary cancer syndromes. We also discussed genetic testing, including the appropriate family members to test, the process of testing, insurance coverage and turn-around-time for results. We discussed the implications of a negative, positive and/or variant of uncertain significant result. We recommended Amanda Burns pursue genetic testing for the [employer].   The Multi-Cancer Panel offered by [facility] includes sequencing and/or deletion duplication testing of the following 85 genes: AIP, ALK, APC, ATM, AXIN2,BAP1,  BARD1, BLM, BMPR1A, BRCA1, BRCA2, BRIP1, CASR, CDC73, CDH1, CDK4, CDKN1B, CDKN1C, CDKN2A (p14ARF), CDKN2A (p16INK4a), CEBPA, CHEK2, CTNNA1, DICER1, DIS3L2, EGFR (c.2369C>T, p.Thr790Met variant only), EPCAM (Deletion/duplication testing only), FH, FLCN, GATA2, GPC3, GREM1 (Promoter region deletion/duplication testing only), HOXB13 (c.251G>A, p.Gly84Glu), HRAS, KIT, MAX, MEN1, MET, MITF (c.952G>A, p.Glu318Lys variant only), MLH1, MSH2, MSH3, MSH6, MUTYH, NBN, NF1, NF2, NTHL1, PALB2, PDGFRA, PHOX2B, PMS2, POLD1, POLE, POT1, PRKAR1A, PTCH1, PTEN, RAD50, RAD51C, RAD51D, RB1, RECQL4, RET, RNF43, RUNX1, SDHAF2, SDHA (sequence changes only), SDHB, SDHC, SDHD, SMAD4, SMARCA4, SMARCB1, SMARCE1, STK11, SUFU, TERC, TERT,  TMEM127, TP53, TSC1, TSC2, VHL, WRN and WT1.  Based on Amanda Burns's personal and family history of cancer, she meets medical criteria for genetic testing. Despite that she meets criteria, there may still be an out of pocket cost. We discussed that if her out of pocket cost for testing is over $100, the laboratory will reach out to let her know. If the out of pocket cost of testing is less than $100 she will be billed by the genetic testing laboratory.   PLAN: After considering the  risks, benefits, and limitations, Ms. Axelrod provided informed consent to pursue genetic testing and the blood sample was sent to [facility] for analysis of the Multi-Cancer Panel. Results should be available within approximately two-three weeks' time, at which point they will be disclosed by telephone to Amanda Burns, as will any additional recommendations warranted by these results. Amanda Burns will receive a summary of her genetic counseling visit and a copy of her results once available. This information will also be available in Epic.   Amanda Burns questions were answered to her satisfaction today. Our contact information was provided should additional questions or concerns arise. Thank you for the referral and allowing [place] to share in the care of your patient.   Clint Guy, [place], [facility] Licensed, Certified Dispensing [email or web address] Phone: [phone]  The patient was seen for a total of 25 minutes in face-to-face genetic counseling.  This patient was discussed with Drs. Magrinat, Lindi Adie and/or Burr Medico who agrees with the above.    _______________________________________________________________________ For Office Staff:  Number of people involved in session: 1 Was an Intern/ student involved with case: no

## 2019-12-06 NOTE — Progress Notes (Signed)
Pharmacist Chemotherapy Monitoring - Initial Assessment    Anticipated start date: 12/12/19  Regimen:  . Are orders appropriate based on the patient's diagnosis, regimen, and cycle? Yes . Does the plan date match the patient's scheduled date? Yes . Is the sequencing of drugs appropriate? Yes . Are the premedications appropriate for the patient's regimen? Yes . Prior Authorization for treatment is: Pending o If applicable, is the correct biosimilar selected based on the patient's insurance? yes  Organ Function and Labs: Marland Kitchen Are dose adjustments needed based on the patient's renal function, hepatic function, or hematologic function? Yes . Are appropriate labs ordered prior to the start of patient's treatment? Yes . Other organ system assessment, if indicated: anthracyclines: Echo/ MUGA . The following baseline labs, if indicated, have been ordered: N/A  Dose Assessment: . Are the drug doses appropriate? Yes . Are the following correct: o Drug concentrations Yes o IV fluid compatible with drug Yes o Administration routes Yes o Timing of therapy Yes . If applicable, does the patient have documented access for treatment and/or plans for port-a-cath placement? yes . If applicable, have lifetime cumulative doses been properly documented and assessed? yes Lifetime Dose Tracking  No doses have been documented on this patient for the following tracked chemicals: Doxorubicin, Epirubicin, Idarubicin, Daunorubicin, Mitoxantrone, Bleomycin, Oxaliplatin, Carboplatin, Liposomal Doxorubicin  o   Toxicity Monitoring/Prevention: . The patient has the following take home antiemetics prescribed: Prochlorperazine . The patient has the following take home medications prescribed: N/A . Medication allergies and previous infusion related reactions, if applicable, have been reviewed and addressed. Yes . The patient's current medication list has been assessed for drug-drug interactions with their chemotherapy  regimen. no significant drug-drug interactions were identified on review.  Order Review: . Are the treatment plan orders signed? Yes . Is the patient scheduled to see a provider prior to their treatment? Yes  I verify that I have reviewed each item in the above checklist and answered each question accordingly.  Amanda Burns 12/06/2019 11:17 AM

## 2019-12-07 ENCOUNTER — Other Ambulatory Visit (HOSPITAL_COMMUNITY)
Admission: RE | Admit: 2019-12-07 | Discharge: 2019-12-07 | Disposition: A | Discharge status: Home / Self Care | Payer: BC Managed Care – PPO | Source: Ambulatory Visit | Attending: Hematology and Oncology | Admitting: Hematology and Oncology

## 2019-12-07 DIAGNOSIS — Z01812 Encounter for preprocedural laboratory examination: Secondary | ICD-10-CM | POA: Diagnosis not present

## 2019-12-07 DIAGNOSIS — Z20822 Contact with and (suspected) exposure to covid-19: Secondary | ICD-10-CM | POA: Insufficient documentation

## 2019-12-07 LAB — SARS CORONAVIRUS 2 (TAT 6-24 HRS): SARS Coronavirus 2: NEGATIVE

## 2019-12-09 ENCOUNTER — Telehealth: Payer: Self-pay | Admitting: *Deleted

## 2019-12-09 ENCOUNTER — Other Ambulatory Visit: Payer: Self-pay | Admitting: General Surgery

## 2019-12-09 ENCOUNTER — Encounter (HOSPITAL_COMMUNITY): Payer: Self-pay | Admitting: General Surgery

## 2019-12-09 NOTE — Telephone Encounter (Signed)
Spoke to pt concerning tx plan discussion from [facility] on 9.22.21. Pt denies questions regarding dx. Relate she would like to have bil mast with reconstruction first, then consider adjuvant chemo. Encourage pt to have echo and attend chemo class to receive the information to help her make the decision on whether she will move forward with the recommendations of chemotherapy.  Spoke to Dr Barry Dienes regarding pt wish to have bil mast with reconstruction. Scheduled and confirmed appt with Dr. Iran Planas to discuss reconstruction on 12/11/19 at 3:15pm.  Physician team notified.

## 2019-12-10 ENCOUNTER — Other Ambulatory Visit: Payer: Self-pay

## 2019-12-10 ENCOUNTER — Inpatient Hospital Stay: Payer: BC Managed Care – PPO

## 2019-12-10 ENCOUNTER — Telehealth: Payer: Self-pay | Admitting: *Deleted

## 2019-12-10 ENCOUNTER — Ambulatory Visit (HOSPITAL_COMMUNITY)
Admission: RE | Admit: 2019-12-10 | Discharge: 2019-12-10 | Disposition: A | Discharge status: Home / Self Care | Payer: BC Managed Care – PPO | Source: Ambulatory Visit | Attending: Hematology and Oncology | Admitting: Hematology and Oncology

## 2019-12-10 DIAGNOSIS — Z171 Estrogen receptor negative status [ER-]: Secondary | ICD-10-CM

## 2019-12-10 DIAGNOSIS — C50412 Malignant neoplasm of upper-outer quadrant of left female breast: Secondary | ICD-10-CM | POA: Diagnosis not present

## 2019-12-10 LAB — ECHOCARDIOGRAM COMPLETE
Area-P 1/2: 4.19 cm2
S' Lateral: 3.4 cm

## 2019-12-10 NOTE — Telephone Encounter (Signed)
Spoke to pt after chemo class. Relate class was very informative. Pt scheduled to see Dr. Iran Planas on 9/29 to discuss reconstruction. Appts for treatment cx, d/t pt would like to move forward with sx first. Denies further questions or needs at this time. Encourage pt to call with concerns.

## 2019-12-10 NOTE — Progress Notes (Signed)
  Echocardiogram 2D Echocardiogram with 3D has been performed.  [name] M 12/10/2019, 9:45 AM

## 2019-12-11 ENCOUNTER — Ambulatory Visit (HOSPITAL_COMMUNITY): Admission: RE | Admit: 2019-12-11 | Payer: BC Managed Care – PPO | Source: Ambulatory Visit | Admitting: General Surgery

## 2019-12-11 ENCOUNTER — Encounter (HOSPITAL_COMMUNITY): Admission: RE | Payer: Self-pay | Source: Ambulatory Visit

## 2019-12-11 SURGERY — INSERTION, TUNNELED CENTRAL VENOUS DEVICE, WITH PORT
Anesthesia: General

## 2019-12-12 ENCOUNTER — Ambulatory Visit: Payer: BC Managed Care – PPO

## 2019-12-12 ENCOUNTER — Ambulatory Visit: Payer: BC Managed Care – PPO | Admitting: Hematology and Oncology

## 2019-12-12 ENCOUNTER — Inpatient Hospital Stay: Payer: BC Managed Care – PPO

## 2019-12-12 ENCOUNTER — Encounter: Payer: Self-pay | Admitting: *Deleted

## 2019-12-13 ENCOUNTER — Telehealth: Payer: Self-pay | Admitting: *Deleted

## 2019-12-13 ENCOUNTER — Ambulatory Visit (HOSPITAL_COMMUNITY)
Admission: RE | Admit: 2019-12-13 | Discharge: 2019-12-13 | Disposition: A | Discharge status: Home / Self Care | Payer: BC Managed Care – PPO | Source: Ambulatory Visit | Attending: Hematology and Oncology | Admitting: Hematology and Oncology

## 2019-12-13 ENCOUNTER — Encounter: Payer: Self-pay | Admitting: *Deleted

## 2019-12-13 ENCOUNTER — Other Ambulatory Visit: Payer: Self-pay

## 2019-12-13 DIAGNOSIS — C50412 Malignant neoplasm of upper-outer quadrant of left female breast: Secondary | ICD-10-CM

## 2019-12-13 DIAGNOSIS — Z171 Estrogen receptor negative status [ER-]: Secondary | ICD-10-CM | POA: Diagnosis present

## 2019-12-13 MED ORDER — GADOBUTROL 1 MMOL/ML IV SOLN
8.0000 mL | Freq: Once | INTRAVENOUS | Status: AC | PRN
  Administered 2019-12-13: 8 mL via INTRAVENOUS

## 2019-12-13 NOTE — Progress Notes (Signed)
Patient late for MRI appointment at 1500 hours today. She did not arrive at the scheduled requested time, which was at 1430 hours.

## 2019-12-13 NOTE — Telephone Encounter (Signed)
Spoke to pt concerning reconstruction/sx decision. Pt has decided to move forward with bilateral mastectomy with immediate reconstruction with port placement. Pt agree to have adjuvant chemo. Physician team notified of pt decision.  Pt in question of FMLA update. Msg sent to [facility] specialist for update.

## 2019-12-14 ENCOUNTER — Ambulatory Visit: Payer: BC Managed Care – PPO

## 2019-12-16 ENCOUNTER — Other Ambulatory Visit: Payer: Self-pay | Admitting: Oncology

## 2019-12-16 ENCOUNTER — Other Ambulatory Visit: Payer: Self-pay | Admitting: *Deleted

## 2019-12-16 ENCOUNTER — Encounter: Payer: Self-pay | Admitting: *Deleted

## 2019-12-16 DIAGNOSIS — Z171 Estrogen receptor negative status [ER-]: Secondary | ICD-10-CM

## 2019-12-16 DIAGNOSIS — C50412 Malignant neoplasm of upper-outer quadrant of left female breast: Secondary | ICD-10-CM

## 2019-12-17 ENCOUNTER — Other Ambulatory Visit: Payer: Self-pay | Admitting: General Surgery

## 2019-12-17 ENCOUNTER — Telehealth: Payer: Self-pay | Admitting: *Deleted

## 2019-12-17 DIAGNOSIS — C50412 Malignant neoplasm of upper-outer quadrant of left female breast: Secondary | ICD-10-CM

## 2019-12-17 NOTE — Progress Notes (Signed)
Nutrition  Patient identified by attending Breast Clinic on 9/22.  Patient was given nutrition packet with RD contact information by nurse navigator.   Chart reviewed.   39 year old female with left breast mass and fibroadenoma in right breast.  Patient wanting bilateral mastectomy with reconstruction, followed by adjuvant chemotherapy  Ht: 61 inches Wt: 174 lb BMI: 32  Patient currently not at nutritional risk.  Please consult RD if changes in nutrition status occur.  Shalamar Plourde B. Zenia Resides, [place], [place] Registered Dietitian [phone] (mobile)

## 2019-12-17 NOTE — Telephone Encounter (Signed)
Called pt to discuss MRI results and recommendations per Dr. Barry Dienes for right axillary US/bx. Received verbal understanding. No further needs voiced.

## 2019-12-18 ENCOUNTER — Telehealth: Payer: Self-pay | Admitting: *Deleted

## 2019-12-18 NOTE — Telephone Encounter (Signed)
Pharmacy Authorization Request form along with clinical notes were faxed to [place] with [employer] at [phone] and [phone]. Confirmation received

## 2019-12-18 NOTE — Telephone Encounter (Signed)
[name] signed FMLA paperwork. Faxed to [employer] and received receipt of successful transmission. Pt notified.

## 2019-12-19 ENCOUNTER — Other Ambulatory Visit: Payer: BC Managed Care – PPO

## 2019-12-19 ENCOUNTER — Other Ambulatory Visit: Payer: Self-pay | Admitting: *Deleted

## 2019-12-19 ENCOUNTER — Ambulatory Visit: Payer: BC Managed Care – PPO | Admitting: Hematology and Oncology

## 2019-12-19 ENCOUNTER — Encounter: Payer: Self-pay | Admitting: *Deleted

## 2019-12-20 ENCOUNTER — Telehealth: Payer: Self-pay | Admitting: Hematology and Oncology

## 2019-12-20 NOTE — Telephone Encounter (Signed)
Per 10/8 schedule mes. Scheduled 11/5 spoke with patient she is aware

## 2019-12-23 ENCOUNTER — Telehealth: Payer: Self-pay | Admitting: *Deleted

## 2019-12-23 ENCOUNTER — Ambulatory Visit: Payer: Self-pay | Admitting: Genetic Counselor

## 2019-12-23 ENCOUNTER — Encounter: Payer: Self-pay | Admitting: Genetic Counselor

## 2019-12-23 ENCOUNTER — Telehealth: Payer: Self-pay | Admitting: Genetic Counselor

## 2019-12-23 DIAGNOSIS — Z1379 Encounter for other screening for genetic and chromosomal anomalies: Secondary | ICD-10-CM

## 2019-12-23 NOTE — Telephone Encounter (Signed)
Revealed negative genetic testing. Discussed that we do not know why she has breast cancer or why there is cancer in the family. There could be a genetic mutation in the family that Amanda Burns did not inherit. There could also be a mutation in a different gene that we are not testing, or our current technology may not be able detect certain mutations. It will therefore be important for her to stay in contact with genetics to keep up with whether additional testing may be appropriate in the future.   A variant of uncertain significance was detected in the CDK4 gene called c.415C>T (p.Arg139*). Her result is still considered normal at this time and should not impact her medical management.

## 2019-12-23 NOTE — Progress Notes (Signed)
HPI:  Amanda Burns was previously seen in the [facility] due to a personal and family history of cancer and concerns regarding a hereditary predisposition to cancer. Please refer to our prior cancer genetics clinic note for more information regarding our discussion, assessment and recommendations, at the time. Amanda Burns recent genetic test results were disclosed to her, as were recommendations warranted by these results. These results and recommendations are discussed in more detail below.  CANCER HISTORY:  Oncology History  Malignant neoplasm of upper-outer quadrant of left breast in female, estrogen receptor negative ([place])  11/29/2019 Initial Diagnosis   Amanda Burns palpated an area of concern in the left breast. Diagnostic mammogram and US showed a 1.8cm mass in the upper outer right breast representing a fibroadenoma, and in the left breast, three adjacent masses at the 2-2:30 position, 1.7cm, 0.8cm, and 1.4cm, with several cysts and no axillary adenopathy. Biopsy showed a benign fibroadenoma in the right breast, and in the left breast, IDC at the 2:30 position, grade 3, HER-2 negative (1+), ER+ 0%, PR+ 0%, Ki67 80%.    12/04/2019 Cancer Staging   Staging form: Breast, AJCC 8th Edition - Clinical: Stage IB (cT1c, cN0, cM0, G3, ER-, PR-, HER2-) - Signed by [name], MD on 12/04/2019   12/19/2019 -  Chemotherapy   The Amanda Burns had dexamethasone (DECADRON) 4 MG tablet, 1 of 1 cycle, Start date: 12/04/2019, End date: -- DOXOrubicin (ADRIAMYCIN) chemo injection 112 mg, 60 mg/m2 = 112 mg, Intravenous,  Once, 0 of 4 cycles palonosetron (ALOXI) injection 0.25 mg, 0.25 mg, Intravenous,  Once, 0 of 8 cycles pegfilgrastim-jmdb (FULPHILA) injection 6 mg, 6 mg, Subcutaneous,  Once, 0 of 4 cycles CARBOplatin (PARAPLATIN) 700 mg in sodium chloride 0.9 % 250 mL chemo infusion, 700 mg (100 % of original dose 700 mg), Intravenous,  Once, 0 of 4 cycles Dose modification: 700 mg (original dose  700 mg, Cycle 5) cyclophosphamide (CYTOXAN) 1,120 mg in sodium chloride 0.9 % 250 mL chemo infusion, 600 mg/m2 = 1,120 mg, Intravenous,  Once, 0 of 4 cycles PACLitaxel (TAXOL) 150 mg in sodium chloride 0.9 % 250 mL chemo infusion (</= 86m/m2), 80 mg/m2 = 150 mg, Intravenous,  Once, 0 of 4 cycles fosaprepitant (EMEND) 150 mg in sodium chloride 0.9 % 145 mL IVPB, 150 mg, Intravenous,  Once, 0 of 8 cycles pembrolizumab (KEYTRUDA) 200 mg in sodium chloride 0.9 % 50 mL chemo infusion, 200 mg (100 % of original dose 200 mg), Intravenous, Once, 0 of 8 cycles Dose modification: 200 mg (original dose 200 mg, Cycle 1, Reason: Provider Judgment), 200 mg (original dose 200 mg, Cycle 5, Reason: Provider Judgment)  for chemotherapy treatment.    12/21/2019 Genetic Testing   Negative genetic testing:  No pathogenic variants detected on the [facility] Multi-Cancer Panel. A variant of uncertain significance (VUS) was detected in the CDK4 gene called c.415C>T (p.Arg139*). The report date is 12/21/2019.  The Multi-Cancer Panel offered by [facility] includes sequencing and/or deletion duplication testing of the following 85 genes: AIP, ALK, APC, ATM, AXIN2,BAP1,  BARD1, BLM, BMPR1A, BRCA1, BRCA2, BRIP1, CASR, CDC73, CDH1, CDK4, CDKN1B, CDKN1C, CDKN2A (p14ARF), CDKN2A (p16INK4a), CEBPA, CHEK2, CTNNA1, DICER1, DIS3L2, EGFR (c.2369C>T, p.Thr790Met variant only), EPCAM (Deletion/duplication testing only), FH, FLCN, GATA2, GPC3, GREM1 (Promoter region deletion/duplication testing only), HOXB13 (c.251G>A, p.Gly84Glu), HRAS, KIT, MAX, MEN1, MET, MITF (c.952G>A, p.Glu318Lys variant only), MLH1, MSH2, MSH3, MSH6, MUTYH, NBN, NF1, NF2, NTHL1, PALB2, PDGFRA, PHOX2B, PMS2, POLD1, POLE, POT1, PRKAR1A, PTCH1, PTEN, RAD50, RAD51C, RAD51D,  RB1, RECQL4, RET, RNF43, RUNX1, SDHAF2, SDHA (sequence changes only), SDHB, SDHC, SDHD, SMAD4, SMARCA4, SMARCB1, SMARCE1, STK11, SUFU, TERC, TERT, TMEM127, TP53, TSC1, TSC2, VHL, WRN and WT1.      FAMILY  HISTORY:  We obtained a detailed, 4-generation family history.  Significant diagnoses are listed below: Family History  Problem Relation Age of Onset  . Breast cancer Other        maternal great-aunt (MGF's sister), dx in her 30s  . Breast cancer Maternal Grandmother 78  . Colon cancer Maternal Grandmother 68  . Cancer Maternal Grandmother 71       Adrenal cancer  . Breast cancer Paternal Aunt        dx. in her 31s  . Throat cancer Paternal Aunt        dx. in her 66s, non-smoker   Amanda Burns has two sons, ages 41 and 42. She has one brother who is 43 and has two daughters. None of these family members have had cancer.  Amanda Burns mother is 30 and has not had cancer. Amanda Burns has one maternal aunt and one maternal uncle. Her aunt had radiation treatment for thyroid problems at the age of 39, but Amanda Burns is unsure if these was a cancer diagnosis. Her maternal grandmother died at the age of 69 and had a history of breast cancer (diagnosed age 57), colon cancer (diagnosed age 12), and adrenal cancer (diagnosed age 5). These were separate primaries. Her maternal grandfather is 58 and has not had cancer. One of her maternal grandfather's sisters was diagnosed with breast cancer in her 4s .  Amanda Burns's father is 52 and has not had cancer. She has four paternal aunts and two paternal uncles. One aunt was diagnosed with breast cancer in her 35s. A second aunt was diagnosed with throat cancer in her 23s and was not a smoker. Her paternal grandmother is in her 40s and does not have cancer, and her paternal grandfather died in his late 61s without cancer.  Ms. Holloway is [place] of previous family history of genetic testing for hereditary cancer risks. Amanda Burns's maternal ancestors are of [place] and [place] descent, and paternal ancestors are of unknown descent. There is no reported Ashkenazi Jewish ancestry. There is no known consanguinity.  GENETIC TEST RESULTS: Genetic testing reported out on  12/21/2019 through the [facility] Multi-Cancer panel. No pathogenic variants were detected.   The Multi-Cancer Panel offered by [facility] includes sequencing and/or deletion duplication testing of the following 85 genes: AIP, ALK, APC, ATM, AXIN2,BAP1,  BARD1, BLM, BMPR1A, BRCA1, BRCA2, BRIP1, CASR, CDC73, CDH1, CDK4, CDKN1B, CDKN1C, CDKN2A (p14ARF), CDKN2A (p16INK4a), CEBPA, CHEK2, CTNNA1, DICER1, DIS3L2, EGFR (c.2369C>T, p.Thr790Met variant only), EPCAM (Deletion/duplication testing only), FH, FLCN, GATA2, GPC3, GREM1 (Promoter region deletion/duplication testing only), HOXB13 (c.251G>A, p.Gly84Glu), HRAS, KIT, MAX, MEN1, MET, MITF (c.952G>A, p.Glu318Lys variant only), MLH1, MSH2, MSH3, MSH6, MUTYH, NBN, NF1, NF2, NTHL1, PALB2, PDGFRA, PHOX2B, PMS2, POLD1, POLE, POT1, PRKAR1A, PTCH1, PTEN, RAD50, RAD51C, RAD51D, RB1, RECQL4, RET, RNF43, RUNX1, SDHAF2, SDHA (sequence changes only), SDHB, SDHC, SDHD, SMAD4, SMARCA4, SMARCB1, SMARCE1, STK11, SUFU, TERC, TERT, TMEM127, TP53, TSC1, TSC2, VHL, WRN and WT1. The test report will be scanned into EPIC and located under the Molecular Pathology section of the Results Review tab.  A portion of the result report is included below for reference.     We discussed with Ms. Knauff that because current genetic testing is not perfect, it is possible there may be a gene mutation in one of  these genes that current testing cannot detect, but that chance is small.  We also discussed that there could be another gene that has not yet been discovered, or that we have not yet tested, that is responsible for the cancer diagnoses in the family. It is also possible there is a hereditary cause for the cancer in the family that Ms. Gaumer did not inherit and therefore was not identified in her testing.  Therefore, it is important to remain in touch with cancer genetics in the future so that we can continue to offer Ms. Colgan the most up to date genetic testing.   Genetic testing did identify a  variant of uncertain significance (VUS) in the CDK4 gene called c.415C>T (p.Arg139*).  At this time, it is unknown if this variant is associated with increased cancer risk or if this is a normal finding, but most variants such as this get reclassified to being inconsequential. It should not be used to make medical management decisions. With time, we suspect the lab will determine the significance of this variant, if any. If we do learn more about it, we will try to contact Ms. Carline to discuss it further. However, it is important to stay in touch with [place] periodically and keep the address and phone number up to date.  ADDITIONAL GENETIC TESTING: We discussed with Ms. Corvino that her genetic testing was fairly extensive.  If there are genes identified to increase cancer risk that can be analyzed in the future, we would be happy to discuss and coordinate this testing at that time.    CANCER SCREENING RECOMMENDATIONS: Ms. Hubka test result is considered negative (normal).  This means that we have not identified a hereditary cause for her personal and family history of cancer at this time. While reassuring, this does not definitively rule out a hereditary predisposition to cancer. It is still possible that there could be genetic mutations that are undetectable by current technology. There could be genetic mutations in genes that have not been tested or identified to increase cancer risk.  Therefore, it is recommended she continue to follow the cancer management and screening guidelines provided by her oncology and primary healthcare provider.   An individual's cancer risk and medical management are not determined by genetic test results alone. Overall cancer risk assessment incorporates additional factors, including personal medical history, family history, and any available genetic information that may result in a personalized plan for cancer prevention and surveillance.  RECOMMENDATIONS FOR FAMILY MEMBERS:   Individuals in this family might be at some increased risk of developing cancer, over the general population risk, simply due to the family history of cancer.  We recommended women in this family have a yearly mammogram beginning at age 2, or 55 years younger than the earliest onset of cancer, an annual clinical breast exam, and perform monthly breast self-exams. Women in this family should also have a gynecological exam as recommended by their primary provider. All family members should be referred for colonoscopy starting at age 21.  It is also possible there is a hereditary cause for the cancer in Ms. Caldron's family that she did not inherit and therefore was not identified in her.  Based on Ms. Brownrigg's family history, we recommended her paternal aunt, who was diagnosed with breast cancer in her 63s, have genetic counseling and testing. Ms. Lindenbaum will let us know if we can be of any assistance in coordinating genetic counseling and/or testing for this family member.   FOLLOW-UP: Lastly,  we discussed with Ms. Norberto that cancer genetics is a rapidly advancing field and it is possible that new genetic tests will be appropriate for her and/or her family members in the future. We encouraged her to remain in contact with cancer genetics on an annual basis so we can update her personal and family histories and let her know of advances in cancer genetics that may benefit this family.   Our contact number was provided. Ms. Boullion questions were answered to her satisfaction, and she knows she is welcome to call us at anytime with additional questions or concerns.   Clint Guy, MS, [facility] Genetic Counselor [email or web address] Phone: [phone]

## 2019-12-23 NOTE — Telephone Encounter (Signed)
Confirmed appt for 2nd look [place] and possible bx. No further needs voiced.

## 2019-12-25 ENCOUNTER — Other Ambulatory Visit: Payer: Self-pay | Admitting: General Surgery

## 2019-12-25 ENCOUNTER — Ambulatory Visit
Admission: RE | Admit: 2019-12-25 | Discharge: 2019-12-25 | Disposition: A | Discharge status: Home / Self Care | Payer: BC Managed Care – PPO | Source: Ambulatory Visit | Attending: General Surgery | Admitting: General Surgery

## 2019-12-25 ENCOUNTER — Other Ambulatory Visit: Payer: Self-pay

## 2019-12-25 DIAGNOSIS — Z171 Estrogen receptor negative status [ER-]: Secondary | ICD-10-CM

## 2019-12-25 DIAGNOSIS — C50412 Malignant neoplasm of upper-outer quadrant of left female breast: Secondary | ICD-10-CM

## 2019-12-26 ENCOUNTER — Encounter: Payer: Self-pay | Admitting: *Deleted

## 2019-12-26 NOTE — H&P (Signed)
Subjective:     Patient ID: Amanda Burns is a 39 y.o. female.  HPI  Here for follow up discussion breast reconstruction prior to planned bilateral mastectomies. Presented with palpable left breast mass. Diagnostic MMG/US showed a 1.8cm mass in the RIGHT UOQ suggestive of a fibroadenoma, and in the LEFT breast, three adjacent masses at the 2-2:30 position spanning 5.2 cm, 1.7cm, 0.8cm, and 1.4cm, no axillary adenopathy. Biopsy RIGHT breast mass with fibroadenoma, and in the LEFT breast, biopsy labeled 2 o clock with IDC, triple negative and left breast 2:30 with ADH, ALH involving a fibroadenoma.  MRI demonstrated biopsy-proven malignancy LEFT UOQ 3.8 x 2.1 x 2.8 cm. An additional mass LEFT LOQ  1.6 may represent multicentric disease or fibroadenoma. A single prominent RIGHT axillary LN noted. No evidence of metastatic lymphadenopathy noted in LEFT axilla.   Second-look US of the LEFT breast and RIGHT axilla completed. No pathologic RIGHT axillary lymphadenopathy  identified. The LN identified on MRI had normal morphology/cortical thickness. Biopsy labeled LEFT breast 4 o clock LOQ 6cmfn with ADH, ALH involving fibroadenoma.  Neo or adjuvant chemotherapy recommended. She has elected to proceed with bilateral mastectomies with port placement.  Genetics with VUS detected in the CDK4. MGM and PA with breast ca.  Current 39 D. Wt stable.  Lives with two sons ages 10 and 53. Accompanied by mother who will assist with post op care. 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      Objective:   Physical Exam Cardiovascular:     Rate and Rhythm: Normal rate and regular rhythm.     Heart sounds: Normal heart sounds.  Pulmonary:     Effort: Pulmonary effort is normal.     Breath sounds: Normal breath sounds.  Skin:    Comments: Fitzpatrick 2     Palpable mass left UOQ Grade 3 ptosis bilateral SN to nipple R 29.5 L 30 cm BW R 23 L 23 cm CW 15 cm Nipple to IMF R  14 L 14 cm     Assessment:     Left breast ca UOQ ER-    Plan:      Awaiting biopsy as above. Pending this plan bilateral skin reduction pattern mastectomies with immediate expander acellular dermis reconstruction.  Given breast size and ptosis recommend skin reduction pattern mastectomy. Reviewed anchor type scars.  Revieweddrains, OR length, hospital stay and recovery, limitations. Discussed process of expansion and implant based risks including rupture, imaging surveillance for silicone implants, infection requiring surgery or removal, contracture. Reviewed risks mastectomy flap necrosis requiring additional surgery.  Discussed use of acellular dermis in reconstruction, cadaveric source, incorporation over several weeks, risk that if has seroma or infection can act as additional nidus for infection if not incorporated.  Discussed prepectoral vs sub pectoral reconstruction. Discussed with patient and benefit of this is no animation deformity, may be less pain. Risk may be more visible rippling over upper poles, greater need of ADM. Reviewed pre pectoral would require larger amount acellular dermis, more drains. Discussed any type reconstruction also risks long term displacement implant and visible rippling. If prepectoral counseled I would recommend she be comfortable with silicone implants as more options that have less rippling. She agrees to prepectoral placement.  Reviewed reconstruction will be asensate and not stimulate. Reviewed additional risks including but not limited to risks mastectomy flap necrosis requiring additional surgery, seroma, hematoma, asymmetry, need to additional procedures, fat necrosis, DVT/PE, damage to adjacent structures, cardiopulmonary complications.  Patient has  received 3 dosis Pfizer COVID 19 vaccine. Discussed risk COVID infectionthrough this elective surgery. Patient will receive COVID testing prior to surgery. Discussed even if patient  receivesa negative test result, the tests in some cases may fail to detect the virus or patient maycontract COVID after the test.COVID 19 infectionbefore/during/aftersurgery may result in lead to a higher chance of complication and death  Drain teaching completed. Rx to [facility] given. Rx for oxycodone Bactrim and robaxin given.

## 2020-01-02 ENCOUNTER — Ambulatory Visit: Payer: BC Managed Care – PPO

## 2020-01-02 ENCOUNTER — Ambulatory Visit: Payer: BC Managed Care – PPO | Admitting: Hematology and Oncology

## 2020-01-02 ENCOUNTER — Other Ambulatory Visit: Payer: BC Managed Care – PPO

## 2020-01-02 NOTE — Progress Notes (Addendum)
Your procedure is scheduled on Thursday, January 09, 2020.  Report to [facility] Main Entrance "A" at 7:00 A.M., and check in at the Admitting office.  Call this number if you have problems the morning of surgery:  [phone]  Call [phone] if you have any questions prior to your surgery date Monday-Friday 8am-4pm    Remember:  Do not eat after midnight the night before your surgery  You may drink clear liquids until 6:00 AM the morning of your surgery.   Clear liquids allowed are: Water, Non-Citrus Juices (without pulp), Carbonated Beverages, Clear Tea, Black Coffee Only, and Gatorade    Take these medicines the morning of surgery with A SIP OF WATER:  amphetamine-dextroamphetamine (ADDERALL XR) buPROPion (WELLBUTRIN XL) doxycycline (PERIOSTAT) omeprazole (PRILOSEC) ondansetron (ZOFRAN)  IF NEEDED: acetaminophen (TYLENOL) fluticasone (FLONASE)  LORazepam (ATIVAN) prochlorperazine (COMPAZINE)  As of today, STOP taking any Aspirin (unless otherwise instructed by your surgeon) Aleve, Naproxen, Ibuprofen, Motrin, Advil, Goody's, BC's, all herbal medications, fish oil, and all vitamins.   HOW TO MANAGE YOUR DIABETES BEFORE AND AFTER SURGERY  Why is it important to control my blood sugar before and after surgery? . Improving blood sugar levels before and after surgery helps healing and can limit problems. . A way of improving blood sugar control is eating a healthy diet by: o  Eating less sugar and carbohydrates o  Increasing activity/exercise o  Talking with your doctor about reaching your blood sugar goals . High blood sugars (greater than 180 mg/dL) can raise your risk of infections and slow your recovery, so you will need to focus on controlling your diabetes during the weeks before surgery. . Make sure that the doctor who takes care of your diabetes knows about your planned surgery including the date and location.  How do I manage my blood sugar before  surgery? . Check your blood sugar at least 4 times a day, starting 2 days before surgery, to make sure that the level is not too high or low. . Check your blood sugar the morning of your surgery when you wake up and every 2 hours until you get to the Short Stay unit. o If your blood sugar is less than 70 mg/dL, you will need to treat for low blood sugar: - Do not take insulin. - Treat a low blood sugar (less than 70 mg/dL) with  cup of clear juice (cranberry or apple), 4 glucose tablets, OR glucose gel. - Recheck blood sugar in 15 minutes after treatment (to make sure it is greater than 70 mg/dL). If your blood sugar is not greater than 70 mg/dL on recheck, call [phone] for further instructions. . Report your blood sugar to the short stay nurse when you get to Short Stay.  . If you are admitted to the hospital after surgery: o Your blood sugar will be checked by the staff and you will probably be given insulin after surgery (instead of oral diabetes medicines) to make sure you have good blood sugar levels. o The goal for blood sugar control after surgery is 80-180 mg/dL.                     Do not wear jewelry, make up, or nail polish            Do not wear lotions, powders, perfumes, or deodorant.            Do not shave 48 hours prior to surgery.  Do not bring valuables to the hospital.            [facility] is not responsible for any belongings or valuables.  Do NOT Smoke (Tobacco/Vaping) or drink Alcohol 24 hours prior to your procedure If you use a CPAP at night, you may bring all equipment for your overnight stay.   Contacts, glasses, dentures or bridgework may not be worn into surgery.      For patients admitted to the hospital, discharge time will be determined by your treatment team.   Patients discharged the day of surgery will not be allowed to drive home, and someone needs to stay with them for 24 hours.    Special instructions:   [place]- Preparing For  Surgery  Before surgery, you can play an important role. Because skin is not sterile, your skin needs to be as free of germs as possible. You can reduce the number of germs on your skin by washing with CHG (chlorahexidine gluconate) Soap before surgery.  CHG is an antiseptic cleaner which kills germs and bonds with the skin to continue killing germs even after washing.    Oral Hygiene is also important to reduce your risk of infection.  Remember - BRUSH YOUR TEETH THE MORNING OF SURGERY WITH YOUR REGULAR TOOTHPASTE  Please do not use if you have an allergy to CHG or antibacterial soaps. If your skin becomes reddened/irritated stop using the CHG.  Do not shave (including legs and underarms) for at least 48 hours prior to first CHG shower. It is OK to shave your face.  Please follow these instructions carefully.   1. Shower the NIGHT BEFORE SURGERY and the MORNING OF SURGERY with CHG Soap.   2. If you chose to wash your hair, wash your hair first as usual with your normal shampoo.  3. After you shampoo, rinse your hair and body thoroughly to remove the shampoo.  4. Use CHG as you would any other liquid soap. You can apply CHG directly to the skin and wash gently with a scrungie or a clean washcloth.   5. Apply the CHG Soap to your body ONLY FROM THE NECK DOWN.  Do not use on open wounds or open sores. Avoid contact with your eyes, ears, mouth and genitals (private parts). Wash Face and genitals (private parts)  with your normal soap.   6. Wash thoroughly, paying special attention to the area where your surgery will be performed.  7. Thoroughly rinse your body with warm water from the neck down.  8. DO NOT shower/wash with your normal soap after using and rinsing off the CHG Soap.  9. Pat yourself dry with a CLEAN TOWEL.  10. Wear CLEAN PAJAMAS to bed the night before surgery  11. Place CLEAN SHEETS on your bed the night of your first shower and DO NOT SLEEP WITH PETS.   Day of  Surgery: Wear Clean/Comfortable clothing the morning of surgery Do not apply any deodorants/lotions.   Remember to brush your teeth WITH YOUR REGULAR TOOTHPASTE.   Please read over the following fact sheets that you were given.

## 2020-01-03 ENCOUNTER — Other Ambulatory Visit: Payer: Self-pay

## 2020-01-03 ENCOUNTER — Encounter (HOSPITAL_COMMUNITY): Payer: Self-pay

## 2020-01-03 ENCOUNTER — Encounter (HOSPITAL_COMMUNITY)
Admission: RE | Admit: 2020-01-03 | Discharge: 2020-01-03 | Disposition: A | Discharge status: Home / Self Care | Payer: BC Managed Care – PPO | Source: Ambulatory Visit | Attending: General Surgery | Admitting: General Surgery

## 2020-01-03 DIAGNOSIS — Z01818 Encounter for other preprocedural examination: Secondary | ICD-10-CM | POA: Diagnosis present

## 2020-01-03 HISTORY — DX: Depression, unspecified: F32.A

## 2020-01-03 HISTORY — DX: Anxiety disorder, unspecified: F41.9

## 2020-01-03 LAB — CBC
HCT: 39.3 % (ref 36.0–46.0)
Hemoglobin: 12.2 g/dL (ref 12.0–15.0)
MCH: 25.1 pg — ABNORMAL LOW (ref 26.0–34.0)
MCHC: 31 g/dL (ref 30.0–36.0)
MCV: 80.7 fL (ref 80.0–100.0)
Platelets: 455 10*3/uL — ABNORMAL HIGH (ref 150–400)
RBC: 4.87 MIL/uL (ref 3.87–5.11)
RDW: 13.9 % (ref 11.5–15.5)
WBC: 7.3 10*3/uL (ref 4.0–10.5)
nRBC: 0 % (ref 0.0–0.2)

## 2020-01-03 LAB — COMPREHENSIVE METABOLIC PANEL
ALT: 40 U/L (ref 0–44)
AST: 26 U/L (ref 15–41)
Albumin: 4.3 g/dL (ref 3.5–5.0)
Alkaline Phosphatase: 89 U/L (ref 38–126)
Anion gap: 10 (ref 5–15)
BUN: 9 mg/dL (ref 6–20)
CO2: 24 mmol/L (ref 22–32)
Calcium: 9.1 mg/dL (ref 8.9–10.3)
Chloride: 102 mmol/L (ref 98–111)
Creatinine, Ser: 0.78 mg/dL (ref 0.44–1.00)
GFR, Estimated: >60 mL/min (ref >60)
Glucose, Bld: 101 mg/dL — ABNORMAL HIGH (ref 70–99)
Potassium: 3.6 mmol/L (ref 3.5–5.1)
Sodium: 136 mmol/L (ref 135–145)
Total Bilirubin: 0.6 mg/dL (ref 0.3–1.2)
Total Protein: 7.7 g/dL (ref 6.5–8.1)

## 2020-01-03 LAB — GLUCOSE, CAPILLARY: Glucose-Capillary: 102 mg/dL — ABNORMAL HIGH (ref 70–99)

## 2020-01-03 NOTE — Progress Notes (Signed)
PCP - Dr. Joyce Gross Cardiologist - Denies Oncologist: Dr. [name]  PPM/ICD - Denies  Chest x-ray - N/A EKG - 01/03/20 Stress Test - Denies ECHO - Denies Cardiac Cath - Denies  Sleep Study - Denies  Patient has a hx of gestational diabetes.  Blood Thinner Instructions: N/A Aspirin Instructions: N/A  ERAS Protcol - Yes PRE-SURGERY Ensure or G2- No  COVID TEST- 01/06/20   Anesthesia review: No  Patient denies shortness of breath, fever, cough and chest pain at PAT appointment   All instructions explained to the patient, with a verbal understanding of the material. Patient agrees to go over the instructions while at home for a better understanding. Patient also instructed to self quarantine after being tested for COVID-19. The opportunity to ask questions was provided.

## 2020-01-04 ENCOUNTER — Ambulatory Visit: Payer: BC Managed Care – PPO

## 2020-01-04 LAB — HEMOGLOBIN A1C
Hgb A1c MFr Bld: 5.7 % — ABNORMAL HIGH (ref 4.8–5.6)
Mean Plasma Glucose: 117 mg/dL

## 2020-01-06 ENCOUNTER — Other Ambulatory Visit (HOSPITAL_COMMUNITY)
Admission: RE | Admit: 2020-01-06 | Discharge: 2020-01-06 | Disposition: A | Discharge status: Home / Self Care | Payer: BC Managed Care – PPO | Source: Ambulatory Visit | Attending: General Surgery | Admitting: General Surgery

## 2020-01-06 DIAGNOSIS — Z01812 Encounter for preprocedural laboratory examination: Secondary | ICD-10-CM | POA: Insufficient documentation

## 2020-01-06 DIAGNOSIS — Z20822 Contact with and (suspected) exposure to covid-19: Secondary | ICD-10-CM | POA: Insufficient documentation

## 2020-01-06 LAB — SARS CORONAVIRUS 2 (TAT 6-24 HRS): SARS Coronavirus 2: NEGATIVE

## 2020-01-06 NOTE — H&P (Signed)
Amanda Burns Location: [facility] Patient #: [id number] DOB: May 12, 1980 Undefined / Language: Cleophus Molt / Race: White Female   History of Present Illness  The patient is a 39 year old female who presents with breast cancer. Pt is a 39 yo F referred for new dx of left breast cancer 11/2019. Pt felt a palpable mass in the upper outer left breast and presented for diagnostic imaging. The right breast had several cysts and a probable fibroadenoma. The left breast had three masses, the largest of which is 1.7 cm in greatest dimension. The three together had a span of 5.2 cm. The patient had no abnormal lymph nodes visible. Core needle biopsy on the left showed a triple negative invasive ductal carcinoma with a Ki 67 of 80%.   Her maternal grandmother has a history of breast cancer. She also has two paternal aunts with breast cancer. There is also family history positive for colon cancer. She had menarche at age 69. She is a G2P2 wtih first child in her early 43s.   dx mammogram/us 11/12/2019 ACR Breast Density Category c: The breast tissue is heterogeneously dense, which may obscure small masses.  FINDINGS: There is an asymmetry involving the upper-outer right breast, possibly related to dense fibroglandular tissue. There is a possible abnormal lymph node in the right axilla. There are at least 3 adjacent irregular masses in the region of palpable concern in the upper-outer left breast, 1 of which appears spiculated, together measuring up to 5.2 cm. There is an additional oval mass in the lower outer left breast measuring 1 point cm.  Mammographic images were processed with CAD.  Physical examination at site of palpable concern in the upper-outer left breast reveals a firm mass at the approximate 130 to 2 o'clock position.  Targeted ultrasound of the right breast was performed demonstrating dense fibroglandular tissue involving the upper-outer quadrant. Several cysts are  seen at the 10 to 11 o'clock position. There is an oval circumscribed hypoechoic mass at 10 o'clock 6 cm from nipple measuring 1.5 x 0.9 x 1.8 cm. This demonstrates imaging features suggestive of a fibroadenoma. No lymphadenopathy seen in the right axilla.  Targeted ultrasound of the upper-outer left breast was performed. There is an irregular hypoechoic mass at 2 o'clock 7 cm from nipple measuring 1.5 x 1.7 x 1.5 cm. An adjacent smaller irregular mass also at 2 o'clock 7 cm from nipple measures 0.8 x 0.7 x 0.7 cm with these 2 masses together measuring up to 2.8 cm. There is an additional irregular hypoechoic mass at the 2:30 position 7 cm from nipple measuring 0.9 x 0.8 x 1.4 cm. These 3 masses correspond with the irregular masses seen in the upper-outer left breast at mammography.  There is a cluster of cysts in the left breast at 3 o'clock 9 cm from nipple measuring 1 x 0.6 x 0.6 cm. This is felt to correspond with and the additional oval mass seen in the lower outer left breast at mammography. An additional cyst at 3 o'clock 7 cm from nipple measures 0.8 x 0.7 x 0.9 cm. There is no definite left axillary lymphadenopathy, with the lymph nodes in the left axilla similar in appearance when compared to the right side.  IMPRESSION: 1. Three highly suspicious left breast masses, all together measuring up to 5.2 cm mammographically.  2. Indeterminate 1.5 cm mass in the right breast, demonstrating imaging features suggestive of a fibroadenoma.  3. No axillary lymphadenopathy.  4. Bilateral breast cysts.  RECOMMENDATION:  1. Recommend ultrasound-guided biopsy of 2 of the highly suspicious masses which are furthest apart from each other, felt to be the 2 largest masses.  2. Recommend ultrasound-guided biopsy of the 1.5 cm mass in the right breast at the 10 o'clock position.  I have discussed the findings and recommendations with the patient. If applicable, a reminder letter  will be sent to the patient regarding the next appointment.  BI-RADS CATEGORY 5: Highly suggestive of malignancy.   Pathology 11/22/2019 1. Breast, left, needle core biopsy, 2:30 o'clock - ATYPICAL DUCTAL HYPERPLASIA AND ATYPICAL LOBULAR HYPERPLASIA INVOLVING FIBROADENOMA. 2. Breast, left, needle core biopsy, 2 o'clcok - INVASIVE DUCTAL CARCINOMA. 3. Breast, right, needle core biopsy, 10 o'clock - FIBROADENOMA. PSEUDOANGIOMATOUS STROMAL HYPERPLASIA  The tumor cells are NEGATIVE for Her2 (1+). Estrogen Receptor: 0%, NEGATIVE Progesterone Receptor: 0%, NEGATIVE Proliferation Marker Ki67: 80%   Past Surgical History Breast Biopsy  Bilateral. Foot Surgery  Right. Oral Surgery   Diagnostic Studies History Mammogram  within last year Pap Smear  1-5 years ago  Medication History  Medications Reconciled  Social History Alcohol use  Remotely quit alcohol use. Illicit drug use  Remotely quit drug use. No caffeine use   Family History  Alcohol Abuse  Father. Arthritis  Mother. Breast Cancer  Family Members In General. Cancer  Family Members In General. Colon Cancer  Family Members In General. Thyroid problems  Family Members In General.  Pregnancy / Birth History  Age at menarche  14 years. Contraceptive History  Contraceptive implant, Oral contraceptives. Gravida  2 Length (months) of breastfeeding  3-6 Maternal age  28-35 Para  2 Regular periods   Other Problems Anxiety Disorder  Depression  Gastroesophageal Reflux Disease  Hemorrhoids  Kidney Stone     Review of Systems  General Not Present- Appetite Loss, Chills, Fatigue, Fever, Night Sweats, Weight Gain and Weight Loss. Skin Not Present- Change in Wart/Mole, Dryness, Hives, Jaundice, New Lesions, Non-Healing Wounds, Rash and Ulcer. HEENT Not Present- Earache, Hearing Loss, Hoarseness, Nose Bleed, Oral Ulcers, Ringing in the Ears, Seasonal Allergies, Sinus Pain, Sore Throat, Visual  Disturbances, Wears glasses/contact lenses and Yellow Eyes. Respiratory Not Present- Bloody sputum, Chronic Cough, Difficulty Breathing, Snoring and Wheezing. Breast Present- Breast Mass. Not Present- Breast Pain, Nipple Discharge and Skin Changes. Cardiovascular Not Present- Chest Pain, Difficulty Breathing Lying Down, Leg Cramps, Palpitations, Rapid Heart Rate, Shortness of Breath and Swelling of Extremities. Gastrointestinal Not Present- Abdominal Pain, Bloating, Bloody Stool, Change in Bowel Habits, Chronic diarrhea, Constipation, Difficulty Swallowing, Excessive gas, Gets full quickly at meals, Hemorrhoids, Indigestion, Nausea, Rectal Pain and Vomiting. Female Genitourinary Not Present- Frequency, Nocturia, Painful Urination, Pelvic Pain and Urgency. Musculoskeletal Not Present- Back Pain, Joint Pain, Joint Stiffness, Muscle Pain, Muscle Weakness and Swelling of Extremities. Neurological Not Present- Decreased Memory, Fainting, Headaches, Numbness, Seizures, Tingling, Tremor, Trouble walking and Weakness. Psychiatric Present- Depression. Not Present- Anxiety, Bipolar, Change in Sleep Pattern, Fearful and Frequent crying. Endocrine Not Present- Cold Intolerance, Excessive Hunger, Hair Changes, Heat Intolerance, Hot flashes and New Diabetes. Hematology Not Present- Blood Thinners, Easy Bruising, Excessive bleeding, Gland problems, HIV and Persistent Infections.   Physical Exam  General Mental Status-Alert. General Appearance-Consistent with stated age. Hydration-Well hydrated. Voice-Normal.  Head and Neck Head-normocephalic, atraumatic with no lesions or palpable masses. Trachea-midline. Thyroid Gland Characteristics - normal size and consistency.  Eye Eyeball - Bilateral-Extraocular movements intact. Sclera/Conjunctiva - Bilateral-No scleral icterus.  Chest and Lung Exam Chest and lung exam reveals -quiet, even and easy respiratory effort  with no use of accessory  muscles and on auscultation, normal breath sounds, no adventitious sounds and normal vocal resonance. Inspection Chest Wall - Normal. Back - normal.  Breast Note: breasts relatively symmetric bilaterally. palpable mass UOQ left breast around 2 cm. mobile. no nipple retraction or skin dimpling. no LAD. both breasts relatively dense.   Cardiovascular Cardiovascular examination reveals -normal heart sounds, regular rate and rhythm with no murmurs and normal pedal pulses bilaterally.  Abdomen Inspection Inspection of the abdomen reveals - No Hernias. Palpation/Percussion Palpation and Percussion of the abdomen reveal - Soft, Non Tender, No Rebound tenderness, No Rigidity (guarding) and No hepatosplenomegaly. Auscultation Auscultation of the abdomen reveals - Bowel sounds normal.  Neurologic Neurologic evaluation reveals -alert and oriented x 3 with no impairment of recent or remote memory. Mental Status-Normal.  Musculoskeletal Global Assessment -Note: no gross deformities.  Normal Exam - Left-Upper Extremity Strength Normal and Lower Extremity Strength Normal. Normal Exam - Right-Upper Extremity Strength Normal and Lower Extremity Strength Normal.  Lymphatic Head & Neck  General Head & Neck Lymphatics: Bilateral - Description - Normal. Axillary  General Axillary Region: Bilateral - Description - Normal. Tenderness - Non Tender. Femoral & Inguinal  Generalized Femoral & Inguinal Lymphatics: Bilateral - Description - No Generalized lymphadenopathy.    Assessment & Plan  MALIGNANT NEOPLASM OF UPPER-OUTER QUADRANT OF LEFT BREAST IN FEMALE, ESTROGEN RECEPTOR NEGATIVE (C50.412) Impression: Pt has a new diagnosis of at least a cT2N0 left triple negative breast cancer.  Will plan bilateral mastectomies due to multiple findings requiring excision in the left breast in different quadrants.  Right mastectomy for symmetry and dense breasts.    Will need port for  triple negative cancer and chemo.  Negative genetic testing.    We will need to excise the area of ADH/fibroadenoma even if not malignant appearing on MR. I discussed port placement and risks including pneumothorax. I discussed post op recovery and restrictions.   Current Plans Pt Education - ccs port insertion education

## 2020-01-08 NOTE — Anesthesia Preprocedure Evaluation (Addendum)
Anesthesia Evaluation  Patient identified by MRN, date of birth, ID band Patient awake    Reviewed: Allergy & Precautions, NPO status , Patient's Chart, lab work & pertinent test results  Airway Mallampati: II  TM Distance: >3 FB Neck ROM: Full    Dental  (+) Dental Advisory Given, Teeth Intact   Pulmonary neg pulmonary ROS,    Pulmonary exam normal breath sounds clear to auscultation       Cardiovascular negative cardio ROS Normal cardiovascular exam Rhythm:Regular Rate:Normal     Neuro/Psych PSYCHIATRIC DISORDERS Anxiety Depression negative neurological ROS     GI/Hepatic Neg liver ROS, GERD  ,  Endo/Other  diabetes  Renal/GU negative Renal ROS     Musculoskeletal negative musculoskeletal ROS (+)   Abdominal (+) + obese,   Peds  Hematology negative hematology ROS (+)   Anesthesia Other Findings   Reproductive/Obstetrics                            Anesthesia Physical Anesthesia Plan  ASA: II  Anesthesia Plan: General   Post-op Pain Management: GA combined w/ Regional for post-op pain   Induction: Intravenous  PONV Risk Score and Plan: 4 or greater and Ondansetron, Dexamethasone, Treatment may vary due to age or medical condition, Midazolam and Scopolamine patch - Pre-op  Airway Management Planned: Oral ETT  Additional Equipment: None  Intra-op Plan:   Post-operative Plan: Extubation in OR  Informed Consent: I have reviewed the patients History and Physical, chart, labs and discussed the procedure including the risks, benefits and alternatives for the proposed anesthesia with the patient or authorized representative who has indicated his/her understanding and acceptance.     Dental advisory given  Plan Discussed with: CRNA  Anesthesia Plan Comments:        Anesthesia Quick Evaluation

## 2020-01-09 ENCOUNTER — Other Ambulatory Visit: Payer: Self-pay

## 2020-01-09 ENCOUNTER — Encounter (HOSPITAL_COMMUNITY): Payer: Self-pay | Admitting: General Surgery

## 2020-01-09 ENCOUNTER — Observation Stay (HOSPITAL_COMMUNITY)
Admission: RE | Admit: 2020-01-09 | Discharge: 2020-01-10 | Disposition: A | Discharge status: Home / Self Care | Payer: BC Managed Care – PPO | Attending: Plastic Surgery | Admitting: Plastic Surgery

## 2020-01-09 ENCOUNTER — Observation Stay (HOSPITAL_COMMUNITY): Payer: BC Managed Care – PPO

## 2020-01-09 ENCOUNTER — Ambulatory Visit (HOSPITAL_COMMUNITY): Payer: BC Managed Care – PPO | Admitting: Vascular Surgery

## 2020-01-09 ENCOUNTER — Ambulatory Visit (HOSPITAL_COMMUNITY): Payer: BC Managed Care – PPO

## 2020-01-09 ENCOUNTER — Ambulatory Visit (HOSPITAL_COMMUNITY): Payer: BC Managed Care – PPO | Admitting: Certified Registered Nurse Anesthetist

## 2020-01-09 ENCOUNTER — Ambulatory Visit (HOSPITAL_COMMUNITY)
Admission: RE | Admit: 2020-01-09 | Discharge: 2020-01-09 | Disposition: A | Discharge status: Home / Self Care | Payer: BC Managed Care – PPO | Source: Ambulatory Visit | Attending: General Surgery | Admitting: General Surgery

## 2020-01-09 ENCOUNTER — Encounter (HOSPITAL_COMMUNITY)
Admission: RE | Disposition: A | Discharge status: Home / Self Care | Payer: Self-pay | Source: Home / Self Care | Attending: Plastic Surgery

## 2020-01-09 DIAGNOSIS — C50912 Malignant neoplasm of unspecified site of left female breast: Secondary | ICD-10-CM | POA: Diagnosis not present

## 2020-01-09 DIAGNOSIS — C50412 Malignant neoplasm of upper-outer quadrant of left female breast: Secondary | ICD-10-CM

## 2020-01-09 DIAGNOSIS — Z95828 Presence of other vascular implants and grafts: Secondary | ICD-10-CM

## 2020-01-09 DIAGNOSIS — Z170 Estrogen receptor positive status [ER+]: Secondary | ICD-10-CM

## 2020-01-09 DIAGNOSIS — C50919 Malignant neoplasm of unspecified site of unspecified female breast: Secondary | ICD-10-CM

## 2020-01-09 HISTORY — PX: MASTECTOMY W/ SENTINEL NODE BIOPSY: SHX2001

## 2020-01-09 HISTORY — PX: BREAST RECONSTRUCTION WITH PLACEMENT OF TISSUE EXPANDER AND FLEX HD (ACELLULAR HYDRATED DERMIS): SHX6295

## 2020-01-09 HISTORY — PX: PORTACATH PLACEMENT: SHX2246

## 2020-01-09 LAB — GLUCOSE, CAPILLARY: Glucose-Capillary: 120 mg/dL — ABNORMAL HIGH (ref 70–99)

## 2020-01-09 LAB — POCT PREGNANCY, URINE: Preg Test, Ur: NEGATIVE

## 2020-01-09 SURGERY — MASTECTOMY WITH SENTINEL LYMPH NODE BIOPSY
Anesthesia: General | Site: Chest | Laterality: Right

## 2020-01-09 MED ORDER — HYDROMORPHONE HCL 1 MG/ML IJ SOLN
INTRAMUSCULAR | Status: AC
  Filled 2020-01-09: qty 1

## 2020-01-09 MED ORDER — METHYLENE BLUE 0.5 % INJ SOLN
INTRAVENOUS | Status: AC
  Filled 2020-01-09: qty 10

## 2020-01-09 MED ORDER — DIPHENHYDRAMINE HCL 50 MG/ML IJ SOLN: 25.0000 mg | Freq: Four times a day (QID) | INTRAMUSCULAR | Status: DC | PRN

## 2020-01-09 MED ORDER — 0.9 % SODIUM CHLORIDE (POUR BTL) OPTIME
TOPICAL | Status: DC | PRN
  Administered 2020-01-09 (×3): 1000 mL

## 2020-01-09 MED ORDER — DIPHENHYDRAMINE HCL 50 MG/ML IJ SOLN
INTRAMUSCULAR | Status: DC | PRN
  Administered 2020-01-09: 6.25 mg via INTRAVENOUS

## 2020-01-09 MED ORDER — BUPROPION HCL ER (XL) 150 MG PO TB24
150.0000 mg | ORAL_TABLET | Freq: Every day | ORAL | Status: DC
  Administered 2020-01-09 – 2020-01-10 (×2): 150 mg via ORAL
  Filled 2020-01-09 (×2): qty 1

## 2020-01-09 MED ORDER — PHENYLEPHRINE HCL-NACL 10-0.9 MG/250ML-% IV SOLN
INTRAVENOUS | Status: DC | PRN
  Administered 2020-01-09: 25 ug/min via INTRAVENOUS

## 2020-01-09 MED ORDER — METOCLOPRAMIDE HCL 5 MG/ML IJ SOLN
INTRAMUSCULAR | Status: AC
  Filled 2020-01-09: qty 2

## 2020-01-09 MED ORDER — BUPIVACAINE-EPINEPHRINE (PF) 0.5% -1:200000 IJ SOLN
INTRAMUSCULAR | Status: DC | PRN
  Administered 2020-01-09 (×2): 20 mL

## 2020-01-09 MED ORDER — CEFAZOLIN SODIUM 1 G IJ SOLR
INTRAMUSCULAR | Status: AC
  Filled 2020-01-09: qty 20

## 2020-01-09 MED ORDER — FENTANYL CITRATE (PF) 100 MCG/2ML IJ SOLN
INTRAMUSCULAR | Status: AC
  Administered 2020-01-09: 100 ug via INTRAVENOUS
  Filled 2020-01-09: qty 2

## 2020-01-09 MED ORDER — ONDANSETRON HCL 4 MG/2ML IJ SOLN
INTRAMUSCULAR | Status: AC
  Filled 2020-01-09: qty 2

## 2020-01-09 MED ORDER — LIDOCAINE 2% (20 MG/ML) 5 ML SYRINGE
INTRAMUSCULAR | Status: DC | PRN
  Administered 2020-01-09: 100 mg via INTRAVENOUS

## 2020-01-09 MED ORDER — SODIUM CHLORIDE (PF) 0.9 % IJ SOLN
INTRAMUSCULAR | Status: AC
  Filled 2020-01-09: qty 60

## 2020-01-09 MED ORDER — OXYCODONE HCL 5 MG PO TABS
5.0000 mg | ORAL_TABLET | ORAL | Status: DC | PRN
  Administered 2020-01-10 (×3): 10 mg via ORAL
  Filled 2020-01-09 (×3): qty 2

## 2020-01-09 MED ORDER — PANTOPRAZOLE SODIUM 40 MG PO TBEC
40.0000 mg | DELAYED_RELEASE_TABLET | Freq: Every day | ORAL | Status: DC
  Administered 2020-01-10: 40 mg via ORAL
  Filled 2020-01-09: qty 1

## 2020-01-09 MED ORDER — DEXAMETHASONE SODIUM PHOSPHATE 4 MG/ML IJ SOLN
INTRAMUSCULAR | Status: DC | PRN
  Administered 2020-01-09 (×2): 4 mg via INTRAVENOUS

## 2020-01-09 MED ORDER — SUGAMMADEX SODIUM 200 MG/2ML IV SOLN
INTRAVENOUS | Status: DC | PRN
  Administered 2020-01-09: 200 mg via INTRAVENOUS

## 2020-01-09 MED ORDER — ALBUMIN HUMAN 5 % IV SOLN: INTRAVENOUS | Status: DC | PRN

## 2020-01-09 MED ORDER — PHENYLEPHRINE 40 MCG/ML (10ML) SYRINGE FOR IV PUSH (FOR BLOOD PRESSURE SUPPORT)
PREFILLED_SYRINGE | INTRAVENOUS | Status: DC | PRN
  Administered 2020-01-09: 120 ug via INTRAVENOUS
  Administered 2020-01-09: 80 ug via INTRAVENOUS
  Administered 2020-01-09: 120 ug via INTRAVENOUS

## 2020-01-09 MED ORDER — MEPERIDINE HCL 25 MG/ML IJ SOLN: 6.2500 mg | INTRAMUSCULAR | Status: DC | PRN

## 2020-01-09 MED ORDER — ONDANSETRON HCL 4 MG/2ML IJ SOLN: 4.0000 mg | Freq: Four times a day (QID) | INTRAMUSCULAR | Status: DC | PRN

## 2020-01-09 MED ORDER — HYDROMORPHONE HCL 1 MG/ML IJ SOLN
0.5000 mg | INTRAMUSCULAR | Status: DC | PRN
  Administered 2020-01-09 (×2): 0.5 mg via INTRAVENOUS
  Filled 2020-01-09 (×2): qty 1

## 2020-01-09 MED ORDER — PHENYLEPHRINE 40 MCG/ML (10ML) SYRINGE FOR IV PUSH (FOR BLOOD PRESSURE SUPPORT)
PREFILLED_SYRINGE | INTRAVENOUS | Status: AC
  Filled 2020-01-09: qty 10

## 2020-01-09 MED ORDER — FENTANYL CITRATE (PF) 250 MCG/5ML IJ SOLN
INTRAMUSCULAR | Status: AC
  Filled 2020-01-09: qty 5

## 2020-01-09 MED ORDER — LIDOCAINE 2% (20 MG/ML) 5 ML SYRINGE
INTRAMUSCULAR | Status: AC
  Filled 2020-01-09: qty 5

## 2020-01-09 MED ORDER — MIDAZOLAM HCL 2 MG/2ML IJ SOLN: 2.0000 mg | Freq: Once | INTRAMUSCULAR | Status: AC

## 2020-01-09 MED ORDER — FENTANYL CITRATE (PF) 100 MCG/2ML IJ SOLN: 100.0000 ug | Freq: Once | INTRAMUSCULAR | Status: AC

## 2020-01-09 MED ORDER — MIDAZOLAM HCL 2 MG/2ML IJ SOLN
INTRAMUSCULAR | Status: AC
  Filled 2020-01-09: qty 2

## 2020-01-09 MED ORDER — FENTANYL CITRATE (PF) 250 MCG/5ML IJ SOLN
INTRAMUSCULAR | Status: DC | PRN
  Administered 2020-01-09: 50 ug via INTRAVENOUS
  Administered 2020-01-09: 100 ug via INTRAVENOUS
  Administered 2020-01-09 (×2): 50 ug via INTRAVENOUS

## 2020-01-09 MED ORDER — ARTIFICIAL TEARS OPHTHALMIC OINT
TOPICAL_OINTMENT | OPHTHALMIC | Status: AC
  Filled 2020-01-09: qty 3.5

## 2020-01-09 MED ORDER — ONDANSETRON HCL 4 MG/2ML IJ SOLN
INTRAMUSCULAR | Status: DC | PRN
  Administered 2020-01-09 (×2): 4 mg via INTRAVENOUS

## 2020-01-09 MED ORDER — LACTATED RINGERS IV SOLN: INTRAVENOUS | Status: DC

## 2020-01-09 MED ORDER — MIDAZOLAM HCL 2 MG/2ML IJ SOLN
INTRAMUSCULAR | Status: DC | PRN
  Administered 2020-01-09: 2 mg via INTRAVENOUS

## 2020-01-09 MED ORDER — CHLORHEXIDINE GLUCONATE CLOTH 2 % EX PADS: 6.0000 | MEDICATED_PAD | Freq: Once | CUTANEOUS | Status: DC

## 2020-01-09 MED ORDER — ROCURONIUM BROMIDE 10 MG/ML (PF) SYRINGE
PREFILLED_SYRINGE | INTRAVENOUS | Status: DC | PRN
  Administered 2020-01-09: 60 mg via INTRAVENOUS
  Administered 2020-01-09: 20 mg via INTRAVENOUS

## 2020-01-09 MED ORDER — SODIUM CHLORIDE 0.9 % IV SOLN: INTRAVENOUS | Status: DC | PRN

## 2020-01-09 MED ORDER — DIPHENHYDRAMINE HCL 50 MG/ML IJ SOLN
INTRAMUSCULAR | Status: AC
  Filled 2020-01-09: qty 1

## 2020-01-09 MED ORDER — METHOCARBAMOL 500 MG PO TABS
500.0000 mg | ORAL_TABLET | Freq: Four times a day (QID) | ORAL | Status: DC | PRN
  Administered 2020-01-10: 500 mg via ORAL
  Filled 2020-01-09: qty 1

## 2020-01-09 MED ORDER — ENOXAPARIN SODIUM 40 MG/0.4ML ~~LOC~~ SOLN
40.0000 mg | SUBCUTANEOUS | Status: DC
  Administered 2020-01-10: 40 mg via SUBCUTANEOUS
  Filled 2020-01-09: qty 0.4

## 2020-01-09 MED ORDER — ROCURONIUM BROMIDE 10 MG/ML (PF) SYRINGE
PREFILLED_SYRINGE | INTRAVENOUS | Status: AC
  Filled 2020-01-09: qty 10

## 2020-01-09 MED ORDER — ONDANSETRON 4 MG PO TBDP: 4.0000 mg | ORAL_TABLET | Freq: Four times a day (QID) | ORAL | Status: DC | PRN

## 2020-01-09 MED ORDER — DIPHENHYDRAMINE HCL 25 MG PO CAPS: 25.0000 mg | ORAL_CAPSULE | Freq: Four times a day (QID) | ORAL | Status: DC | PRN

## 2020-01-09 MED ORDER — KCL IN DEXTROSE-NACL 20-5-0.45 MEQ/L-%-% IV SOLN
INTRAVENOUS | Status: DC
  Filled 2020-01-09: qty 1000

## 2020-01-09 MED ORDER — HYDROMORPHONE HCL 1 MG/ML IJ SOLN
0.2500 mg | INTRAMUSCULAR | Status: DC | PRN
  Administered 2020-01-09 (×2): 0.5 mg via INTRAVENOUS

## 2020-01-09 MED ORDER — DEXAMETHASONE SODIUM PHOSPHATE 10 MG/ML IJ SOLN
INTRAMUSCULAR | Status: AC
  Filled 2020-01-09: qty 1

## 2020-01-09 MED ORDER — MIDAZOLAM HCL 2 MG/2ML IJ SOLN
INTRAMUSCULAR | Status: AC
  Administered 2020-01-09: 2 mg via INTRAVENOUS
  Filled 2020-01-09: qty 2

## 2020-01-09 MED ORDER — TECHNETIUM TC 99M SULFUR COLLOID FILTERED
1.0000 | Freq: Once | INTRAVENOUS | Status: AC | PRN
  Administered 2020-01-09: 1 via INTRADERMAL

## 2020-01-09 MED ORDER — HEPARIN SOD (PORK) LOCK FLUSH 100 UNIT/ML IV SOLN
INTRAVENOUS | Status: AC
  Filled 2020-01-09: qty 5

## 2020-01-09 MED ORDER — SCOPOLAMINE 1 MG/3DAYS TD PT72
1.0000 | MEDICATED_PATCH | TRANSDERMAL | Status: DC
  Administered 2020-01-09: 1.5 mg via TRANSDERMAL
  Filled 2020-01-09: qty 1

## 2020-01-09 MED ORDER — SODIUM CHLORIDE 0.9 % IV SOLN
INTRAVENOUS | Status: AC
  Filled 2020-01-09: qty 1.2

## 2020-01-09 MED ORDER — METOCLOPRAMIDE HCL 5 MG/ML IJ SOLN
INTRAMUSCULAR | Status: DC | PRN
  Administered 2020-01-09: 10 mg via INTRAVENOUS

## 2020-01-09 MED ORDER — KETOROLAC TROMETHAMINE 30 MG/ML IJ SOLN
INTRAMUSCULAR | Status: AC
  Administered 2020-01-09: 30 mg via INTRAVENOUS
  Filled 2020-01-09: qty 1

## 2020-01-09 MED ORDER — PROPOFOL 10 MG/ML IV BOLUS
INTRAVENOUS | Status: DC | PRN
  Administered 2020-01-09: 200 mg via INTRAVENOUS

## 2020-01-09 MED ORDER — CHLORHEXIDINE GLUCONATE 0.12 % MT SOLN
15.0000 mL | Freq: Once | OROMUCOSAL | Status: AC
  Administered 2020-01-09: 15 mL via OROMUCOSAL
  Filled 2020-01-09: qty 15

## 2020-01-09 MED ORDER — PROMETHAZINE HCL 25 MG/ML IJ SOLN: 6.2500 mg | INTRAMUSCULAR | Status: DC | PRN

## 2020-01-09 MED ORDER — PROPOFOL 10 MG/ML IV BOLUS
INTRAVENOUS | Status: AC
  Filled 2020-01-09: qty 40

## 2020-01-09 MED ORDER — ORAL CARE MOUTH RINSE: 15.0000 mL | Freq: Once | OROMUCOSAL | Status: AC

## 2020-01-09 MED ORDER — DEXAMETHASONE SODIUM PHOSPHATE 10 MG/ML IJ SOLN
INTRAMUSCULAR | Status: DC | PRN
  Administered 2020-01-09: 10 mg via INTRAVENOUS

## 2020-01-09 MED ORDER — CLONIDINE HCL (ANALGESIA) 100 MCG/ML EP SOLN
EPIDURAL | Status: DC | PRN
  Administered 2020-01-09 (×2): 50 ug

## 2020-01-09 MED ORDER — ESCITALOPRAM OXALATE 20 MG PO TABS
20.0000 mg | ORAL_TABLET | Freq: Every day | ORAL | Status: DC
  Administered 2020-01-09 – 2020-01-10 (×2): 20 mg via ORAL
  Filled 2020-01-09 (×2): qty 1

## 2020-01-09 MED ORDER — HEPARIN SOD (PORK) LOCK FLUSH 100 UNIT/ML IV SOLN
INTRAVENOUS | Status: DC | PRN
  Administered 2020-01-09: 500 [IU] via INTRAVENOUS

## 2020-01-09 MED ORDER — SODIUM CHLORIDE 0.9 % IV SOLN
Freq: Once | INTRAVENOUS | Status: DC
  Filled 2020-01-09: qty 10

## 2020-01-09 MED ORDER — KETOROLAC TROMETHAMINE 30 MG/ML IJ SOLN
30.0000 mg | Freq: Three times a day (TID) | INTRAMUSCULAR | Status: AC
  Administered 2020-01-09 – 2020-01-10 (×2): 30 mg via INTRAVENOUS
  Filled 2020-01-09 (×2): qty 1

## 2020-01-09 MED ORDER — CEFAZOLIN SODIUM-DEXTROSE 1-4 GM/50ML-% IV SOLN
1.0000 g | Freq: Three times a day (TID) | INTRAVENOUS | Status: DC
  Administered 2020-01-09 – 2020-01-10 (×2): 1 g via INTRAVENOUS
  Filled 2020-01-09 (×3): qty 50

## 2020-01-09 MED ORDER — ACETAMINOPHEN 500 MG PO TABS
1000.0000 mg | ORAL_TABLET | ORAL | Status: AC
  Administered 2020-01-09: 1000 mg via ORAL
  Filled 2020-01-09: qty 2

## 2020-01-09 MED ORDER — CEFAZOLIN SODIUM-DEXTROSE 2-4 GM/100ML-% IV SOLN
2.0000 g | INTRAVENOUS | Status: AC
  Administered 2020-01-09 (×2): 2 g via INTRAVENOUS
  Filled 2020-01-09: qty 100

## 2020-01-09 SURGICAL SUPPLY — 101 items
ADH SKN CLS APL DERMABOND .7 (GAUZE/BANDAGES/DRESSINGS) ×4
ALLOGRAFT PERF 16X20 1.6+/-0.4 (Tissue) ×2 IMPLANT
APL PRP STRL LF DISP 70% ISPRP (MISCELLANEOUS) ×4
ATCH SMKEVC FLXB CAUT HNDSWH (FILTER) ×2 IMPLANT
BAG DECANTER FOR FLEXI CONT (MISCELLANEOUS) ×6 IMPLANT
BINDER BREAST LRG (GAUZE/BANDAGES/DRESSINGS) IMPLANT
BINDER BREAST XLRG (GAUZE/BANDAGES/DRESSINGS) ×1 IMPLANT
BIOPATCH RED 1 DISK 7.0 (GAUZE/BANDAGES/DRESSINGS) ×5 IMPLANT
BLADE SURG 10 STRL SS (BLADE) ×2 IMPLANT
BNDG COHESIVE 4X5 TAN STRL (GAUZE/BANDAGES/DRESSINGS) ×3 IMPLANT
CANISTER SUCT 3000ML PPV (MISCELLANEOUS) ×6 IMPLANT
CHLORAPREP W/TINT 26 (MISCELLANEOUS) ×6 IMPLANT
CLIP VESOCCLUDE LG 6/CT (CLIP) ×3 IMPLANT
CLIP VESOCCLUDE MED 6/CT (CLIP) ×4 IMPLANT
CLIP VESOCCLUDE SM WIDE 6/CT (CLIP) ×3 IMPLANT
CNTNR URN SCR LID CUP LEK RST (MISCELLANEOUS) ×2 IMPLANT
CONT SPEC 4OZ STRL OR WHT (MISCELLANEOUS) ×3
COVER PROBE W GEL 5X96 (DRAPES) ×3 IMPLANT
COVER SURGICAL LIGHT HANDLE (MISCELLANEOUS) ×6 IMPLANT
COVER TRANSDUCER ULTRASND GEL (DISPOSABLE) ×1 IMPLANT
DERMABOND ADVANCED (GAUZE/BANDAGES/DRESSINGS) ×2
DERMABOND ADVANCED .7 DNX12 (GAUZE/BANDAGES/DRESSINGS) ×6 IMPLANT
DRAIN CHANNEL 15F RND FF W/TCR (WOUND CARE) ×2 IMPLANT
DRAIN CHANNEL 19F RND (DRAIN) ×8 IMPLANT
DRAPE C-ARM 42X120 X-RAY (DRAPES) ×3 IMPLANT
DRAPE CHEST BREAST 15X10 FENES (DRAPES) ×3 IMPLANT
DRAPE HALF SHEET 40X57 (DRAPES) ×6 IMPLANT
DRAPE INCISE IOBAN 66X45 STRL (DRAPES) ×1 IMPLANT
DRAPE ORTHO SPLIT 77X108 STRL (DRAPES) ×6
DRAPE SURG ORHT 6 SPLT 77X108 (DRAPES) ×4 IMPLANT
DRAPE WARM FLUID 44X44 (DRAPES) ×3 IMPLANT
DRSG PAD ABDOMINAL 8X10 ST (GAUZE/BANDAGES/DRESSINGS) ×6 IMPLANT
DRSG TEGADERM 4X4.75 (GAUZE/BANDAGES/DRESSINGS) ×12 IMPLANT
ELECT BLADE 4.0 EZ CLEAN MEGAD (MISCELLANEOUS) ×6
ELECT CAUTERY BLADE 6.4 (BLADE) ×5 IMPLANT
ELECT COATED BLADE 2.86 ST (ELECTRODE) ×5 IMPLANT
ELECT REM PT RETURN 9FT ADLT (ELECTROSURGICAL) ×6
ELECTRODE BLDE 4.0 EZ CLN MEGD (MISCELLANEOUS) ×2 IMPLANT
ELECTRODE REM PT RTRN 9FT ADLT (ELECTROSURGICAL) ×4 IMPLANT
EVACUATOR SILICONE 100CC (DRAIN) ×9 IMPLANT
EVACUATOR SMOKE ACCUVAC VALLEY (FILTER) ×3
EXPANDER TISSUE FV FOURTE 500 (Prosthesis & Implant Plastic) IMPLANT
EXPANDER TISSUE MX FOURTE 500 (Prosthesis & Implant Plastic) IMPLANT
GAUZE 4X4 16PLY RFD (DISPOSABLE) ×3 IMPLANT
GAUZE SPONGE 4X4 12PLY STRL (GAUZE/BANDAGES/DRESSINGS) ×3 IMPLANT
GEL ULTRASOUND 20GR AQUASONIC (MISCELLANEOUS) IMPLANT
GLOVE BIO SURGEON STRL SZ 6 (GLOVE) ×12 IMPLANT
GLOVE INDICATOR 6.5 STRL GRN (GLOVE) ×3 IMPLANT
GOWN STRL REUS W/ TWL LRG LVL3 (GOWN DISPOSABLE) ×6 IMPLANT
GOWN STRL REUS W/TWL 2XL LVL3 (GOWN DISPOSABLE) ×3 IMPLANT
GOWN STRL REUS W/TWL LRG LVL3 (GOWN DISPOSABLE) ×9
KIT BASIN OR (CUSTOM PROCEDURE TRAY) ×6 IMPLANT
KIT PORT POWER 8FR ISP CVUE (Port) ×1 IMPLANT
KIT TURNOVER KIT B (KITS) ×6 IMPLANT
LIGHT WAVEGUIDE WIDE FLAT (MISCELLANEOUS) ×3 IMPLANT
MARKER SKIN DUAL TIP RULER LAB (MISCELLANEOUS) ×5 IMPLANT
NS IRRIG 1000ML POUR BTL (IV SOLUTION) ×9 IMPLANT
PACK GENERAL/GYN (CUSTOM PROCEDURE TRAY) ×5 IMPLANT
PACK UNIVERSAL I (CUSTOM PROCEDURE TRAY) ×3 IMPLANT
PAD ABD 8X10 STRL (GAUZE/BANDAGES/DRESSINGS) ×2 IMPLANT
PAD ARMBOARD 7.5X6 YLW CONV (MISCELLANEOUS) ×6 IMPLANT
PENCIL BUTTON HOLSTER BLD 10FT (ELECTRODE) ×4 IMPLANT
PENCIL SMOKE EVACUATOR (MISCELLANEOUS) ×4 IMPLANT
PIN SAFETY STERILE (MISCELLANEOUS) ×3 IMPLANT
POSITIONER HEAD DONUT 9IN (MISCELLANEOUS) ×3 IMPLANT
PREFILTER EVAC NS 1 1/3-3/8IN (MISCELLANEOUS) ×3 IMPLANT
SET ASEPTIC TRANSFER (MISCELLANEOUS) ×3 IMPLANT
SLEEVE SUCTION 125 (MISCELLANEOUS) ×2 IMPLANT
SOL PREP POV-IOD 4OZ 10% (MISCELLANEOUS) ×3 IMPLANT
SPECIMEN JAR X LARGE (MISCELLANEOUS) ×4 IMPLANT
SPONGE LAP 18X18 RF (DISPOSABLE) ×5 IMPLANT
STAPLER VISISTAT 35W (STAPLE) ×5 IMPLANT
STOCKINETTE IMPERVIOUS 9X36 MD (GAUZE/BANDAGES/DRESSINGS) ×3 IMPLANT
STRIP CLOSURE SKIN 1/2X4 (GAUZE/BANDAGES/DRESSINGS) ×2 IMPLANT
SUT CHROMIC 4 0 PS 2 18 (SUTURE) ×5 IMPLANT
SUT ETHILON 2 0 FS 18 (SUTURE) ×9 IMPLANT
SUT MNCRL AB 4-0 PS2 18 (SUTURE) ×5 IMPLANT
SUT MON AB 4-0 PC3 18 (SUTURE) ×2 IMPLANT
SUT PDS AB 2-0 CT2 27 (SUTURE) IMPLANT
SUT PROLENE 2 0 SH DA (SUTURE) ×6 IMPLANT
SUT SILK 2 0 (SUTURE) ×3
SUT SILK 2 0 PERMA HAND 18 BK (SUTURE) ×3 IMPLANT
SUT SILK 2-0 18XBRD TIE 12 (SUTURE) ×2 IMPLANT
SUT VIC AB 0 CT2 27 (SUTURE) ×5 IMPLANT
SUT VIC AB 3-0 SH 27 (SUTURE) ×9
SUT VIC AB 3-0 SH 27X BRD (SUTURE) ×2 IMPLANT
SUT VIC AB 3-0 SH 8-18 (SUTURE) ×3 IMPLANT
SUT VICRYL 4-0 PS2 18IN ABS (SUTURE) ×3 IMPLANT
SUT VLOC 180 0 24IN GS25 (SUTURE) ×2 IMPLANT
SYR 50ML LL SCALE MARK (SYRINGE) ×2 IMPLANT
SYR 5ML LUER SLIP (SYRINGE) ×2 IMPLANT
SYR BULB IRRIG 60ML STRL (SYRINGE) ×3 IMPLANT
SYR CONTROL 10ML LL (SYRINGE) IMPLANT
TISSUE EXPNDR FV FOURTE 500 (Prosthesis & Implant Plastic) ×6 IMPLANT
TISSUE EXPNDR MX FOURTE 500 (Prosthesis & Implant Plastic) IMPLANT
TOWEL GREEN STERILE (TOWEL DISPOSABLE) ×6 IMPLANT
TOWEL GREEN STERILE FF (TOWEL DISPOSABLE) ×6 IMPLANT
TRAY FOLEY MTR SLVR 16FR STAT (SET/KITS/TRAYS/PACK) ×1 IMPLANT
TRAY LAPAROSCOPIC MC (CUSTOM PROCEDURE TRAY) ×3 IMPLANT
TUBE CONNECTING 12X1/4 (SUCTIONS) ×6 IMPLANT
YANKAUER SUCT BULB TIP NO VENT (SUCTIONS) IMPLANT

## 2020-01-09 NOTE — Transfer of Care (Signed)
Immediate Anesthesia Transfer of Care Note  Patient: Amanda Burns  Procedure(s) Performed: BILATERAL MASTECTOMY WITH LEFT SENTINEL LYMPH NODE BIOPSY (Bilateral Breast) INSERTION PORT-A-CATH (Right Chest) BREAST RECONSTRUCTION WITH PLACEMENT OF TISSUE EXPANDER AND FLEX HD (ACELLULAR HYDRATED DERMIS) (Bilateral Breast)  Patient Location: PACU  Anesthesia Type:General and Regional  Level of Consciousness: drowsy and patient cooperative  Airway & Oxygen Therapy: Patient Spontanous Breathing and Patient connected to face mask oxygen  Post-op Assessment: Report given to RN and Post -op Vital signs reviewed and stable  Post vital signs: Reviewed and stable  Last Vitals:  Vitals Value Taken Time  BP 108/79 01/09/20 1459  Temp    Pulse 99 01/09/20 1502  Resp 30 01/09/20 1502  SpO2 100 % 01/09/20 1502  Vitals shown include unvalidated device data.  Last Pain:  Vitals:   01/09/20 0905  TempSrc:   PainSc: 0-No pain      Patients Stated Pain Goal: 2 (15/04/13 6438)  Complications: No complications documented.

## 2020-01-09 NOTE — Anesthesia Procedure Notes (Signed)
Procedure Name: Intubation Date/Time: 01/09/2020 9:45 AM Performed by: Kathryne Hitch, CRNA Pre-anesthesia Checklist: Patient identified, Emergency Drugs available, Suction available and Patient being monitored Patient Re-evaluated:Patient Re-evaluated prior to induction Oxygen Delivery Method: Circle system utilized Preoxygenation: Pre-oxygenation with 100% oxygen Induction Type: IV induction Ventilation: Mask ventilation without difficulty and Oral airway inserted - appropriate to patient size Laryngoscope Size: Sabra Heck and 2 Grade View: Grade I Tube type: Oral Tube size: 7.0 mm Number of attempts: 1 Airway Equipment and Method: Stylet and Oral airway Placement Confirmation: ETT inserted through vocal cords under direct vision,  positive ETCO2 and breath sounds checked- equal and bilateral Secured at: 22 cm Tube secured with: Tape Dental Injury: Teeth and Oropharynx as per pre-operative assessment

## 2020-01-09 NOTE — Op Note (Signed)
Operative Note   DATE OF OPERATION: 10.28.21  LOCATION: [facility]-observ[facility]  SURGICAL DIVISION: Plastic Surgery  PREOPERATIVE DIAGNOSES:  Left breast cancer UOQ ER-  POSTOPERATIVE DIAGNOSES:  same  PROCEDURE:  1. Bilateral breast reconstruction with tissue expanders 2. Acellular dermis (Alloderm) to bilateral chest   SURGEON: Irene Limbo MD MBA  ASSISTANT: Gentry Fitz RNFA  ANESTHESIA:  General.   EBL: 300 ml for entire procedure  COMPLICATIONS: None immediate.   INDICATIONS FOR PROCEDURE:  The patient, Amanda Burns, is a 39 y.o. female born on Jul 21, 1980, is here for immediate prepectoral expander acellular dermis reconstruction following skin reduction pattern mastectomies.   FINDINGS: Natrelle 133S-FV-13T 500 ml tissue expanders placed bilateral, initial fill volume 300 ml air. RIGHT SN [id number] LEFT SN [id number]  DESCRIPTION OF PROCEDURE:  The patient was marked to mark sternal notch, chest midline, anterior axillary lines and inframammary folds. Patient was marked for skin reduction mastectomy with most superior portion nipple areola marked on breast meridian. Vertical limbs marked by breast displacement and set at9cm length. The patient was taken to the operating room. SCDs were placed and IV antibiotics were given. Foley catheter placed. The patient's operative site was prepped and draped in a sterile fashion. A time out was performed and all information was confirmed to be correct. In supine position, the lateral limbs for resection marked and area over lower pole preserved as inferiorly based dermal pedicle. Skin de epithelialized in this area.Following completion of mastectomies, reconstruction began onrightside.  The cavity was irrigated with saline and hemostasis obtained. Cavity then irrigated with solution containingAncef, gentamicin, and Betadine. A 19 Fr drain was placed in subcutaneous position laterally anda 15 Fr drain placed along inframammary fold.  Eachsecured to skin with 2-0 nylon.The tissue expanderswere prepared on back table prior in insertion. The expander was filled with air.Perforated acellular dermis was draped over anterior surface expander. The ADM was then secured to itself over posterior surface of expanderwith 4-0 chromic. Redundant folds acellular dermis excised so that the ADM layflat without folds over air filled expander.The expander was secured to medial insertion pectoralis with a 0 vicryl.The superior and lateral tabs also secured to pectoralis muscle with 0-vicryl. The ADM was secured to pectoralis muscle and chest wall at inframammary foldwith 0Vlock suture.Laterally the mastectomy flap over posterior axillary line was advanced anteriorly and the subcutaneous tissue and superficial fascia was secured tochest wallwith 0-vicryl. The inferiorly based dermal pedicle was redraped superiorly over expander and acellular dermis and secured to pectoralis with interrupted 0-vicryl. Skin closure completedwith 3-0 vicryl in fascial layer and 4-0 vicryl in dermis. Skin closure completed with 4-0 monocryl subcuticular and tissue adhesive.  I then directed my attention toleftchest where similar irrigation and drain placement completed. The prepared expander with ADM secured over anterior surface was placed inleft chest and tabs secured to chest wall and pectoralis muscle with 0- vicryl suture. The acellular dermis at inframammary fold was secured to chest wall with 0 V-lock suture.Laterally the mastectomy flap over posterior axillary line was advanced anteriorly and the subcutaneous tissue and superficial fascia was secured tochest wallwith 0-vicryl. The inferiorly based dermal pedicle was redraped superiorly over expander and acellular dermis and secured to pectoralis with interrupted 0-vicryl. Skin closure completedwith 3-0 vicryl in fascial layer and 4-0 vicryl in dermis. Skin closure completed with 4-0 monocryl  subcuticular and tissue adhesive.Tegaderms applied bilateral, followed by dry dressing and breast binder.  The patient was allowed to wake from anesthesia, extubated and taken to the recovery  room in satisfactory condition.   SPECIMENS: none  DRAINS: 15 and 19 Fr JP in right nad left breast reconstruction

## 2020-01-09 NOTE — Plan of Care (Signed)
  Problem: Education: Goal: Knowledge of disease or condition will improve Outcome: Progressing   Problem: Activity: Goal: Ability to maintain or regain function will improve Outcome: Progressing   Problem: Clinical Measurements: Goal: Postoperative complications will be avoided or minimized Outcome: Progressing   

## 2020-01-09 NOTE — Interval H&P Note (Signed)
History and Physical Interval Note:  01/09/2020 7:44 AM  Amanda Burns  has presented today for surgery, with the diagnosis of LEFT BREAST CANCER.  The various methods of treatment have been discussed with the patient and family. After consideration of risks, benefits and other options for treatment, the patient has consented to  bilateral breast reconstruction with tissue expanders acellular dermis as a surgical intervention.  The patient's history has been reviewed, patient examined, no change in status, stable for surgery.  I have reviewed the patient's chart and labs.  Questions were answered to the patient's satisfaction.     Arnoldo Hooker Ladoris Lythgoe

## 2020-01-09 NOTE — Anesthesia Procedure Notes (Signed)
Anesthesia Regional Block: Pectoralis block   Pre-Anesthetic Checklist: ,, timeout performed, Correct Patient, Correct Site, Correct Laterality, Correct Procedure, Correct Position, site marked, Risks and benefits discussed,  Surgical consent,  Pre-op evaluation,  At surgeon's request and post-op pain management  Laterality: Left and Right  Prep: chloraprep       Needles:   Needle Type: Stimiplex     Needle Length: 9cm      Additional Needles:   Procedures:,,,, ultrasound used (permanent image in chart),,,,  Narrative:  Start time: 01/09/2020 8:50 AM End time: 01/09/2020 9:00 AM Injection made incrementally with aspirations every 5 mL.  Performed by: Personally  Anesthesiologist: Nolon Nations, MD  Additional Notes: Patient tolerated well. Good fascial spread noted.

## 2020-01-09 NOTE — Anesthesia Postprocedure Evaluation (Signed)
Anesthesia Post Note  Patient: Amanda Burns  Procedure(s) Performed: BILATERAL MASTECTOMY WITH LEFT SENTINEL LYMPH NODE BIOPSY (Bilateral Breast) INSERTION PORT-A-CATH (Right Chest) BREAST RECONSTRUCTION WITH PLACEMENT OF TISSUE EXPANDER AND FLEX HD (ACELLULAR HYDRATED DERMIS) (Bilateral Breast)     Patient location during evaluation: PACU Anesthesia Type: General Level of consciousness: sedated and patient cooperative Pain management: pain level controlled Vital Signs Assessment: post-procedure vital signs reviewed and stable Respiratory status: spontaneous breathing Cardiovascular status: stable Anesthetic complications: no   No complications documented.  Last Vitals:  Vitals:   01/09/20 1615 01/09/20 1648  BP:  102/66  Pulse: (!) 103 (!) 102  Resp: 14 14  Temp:  36.8 C  SpO2: 96% 100%    Last Pain:  Vitals:   01/09/20 1654  TempSrc:   PainSc: 2                  Nolon Nations

## 2020-01-09 NOTE — Op Note (Signed)
Bilateral Mastectomies with Left Sentinel Node Biopsy, Right subclavian port placement  Indications: This patient presents with history of left breast cancer, right breast fibroadenoma and atypical lobular hyperplasia  Pre-operative Diagnosis: left breast cancer, cT1bN0M0, upper outer quadrant, receptors -/-/-  Post-operative Diagnosis: same  Surgeon: [name]   Assistant:  Pryor Curia, RNFA  Anesthesia: General endotracheal anesthesia and pectoral block  ASA Class: 2  Procedure Details  The patient was seen in the Holding Room. The risks, benefits, complications, treatment options, and expected outcomes were discussed with the patient. The possibilities of reaction to medication, pulmonary aspiration, bleeding, infection, the need for additional procedures, failure to diagnose a condition, and creating a complication requiring transfusion or operation were discussed with the patient. The patient concurred with the proposed plan, giving informed consent.  The site of surgery properly noted/marked. The patient was taken to Operating Room # 9, identified as Amanda Burns and the procedure verified as Bilateral Mastectomies, left sentinel Node Biopsy, port placement. A Time Out was held and the above information confirmed.   Patient's arms were tucked and the upper   chest and neck were prepped and draped in sterile fashion.  When all was   correct, we continued.   The right subclavian vein was accessed with 1 pass(es) of the needle. There was good venous return and the wire passed easily with no ectopy.   Fluoroscopy was used to confirm that the wire was in the vena cava.      The patient was placed back level and the area for the pocket was anethetized   with local anesthetic.  A 3-cm transverse incision was made with a #15   blade.  Cautery was used to divide the subcutaneous tissues down to the   pectoralis muscle.  An [employer] retractor was used to elevate the skin   while a  pocket was created on top of the pectoralis fascia.  The port   was placed into the pocket to confirm that it was of adequate size.  The   catheter was preattached to the port.  The port was then secured to the   pectoralis fascia with four 2-0 Prolene sutures.  These were clamped and   not tied down yet.     The catheter was placed along the wire to determine what length it should be to be in the SVC.  The catheter was cut at 14.5 cm.  The tunneler sheath and dilator were passed over the wire and the dilator and wire were removed.  The catheter was advanced through the tunneler sheath and the tunneler sheath was pulled away.  Care was taken to keep the catheter in the tunneler sheath as this occurred. This was advanced and the tunneler sheath was removed.  There was good venous   return and easy flush of the catheter.  The Prolene sutures were tied   down to the pectoral fascia.  The skin was reapproximated using 3-0   Vicryl interrupted deep dermal sutures.    Fluoroscopy was used to re-confirm good position of the catheter.  The skin   was then closed using 4-0 Monocryl in a subcuticular fashion.  The port was flushed with concentrated heparin flush as well.  The wounds were then cleaned, dried, and dressed with Dermabond.  The previous drapes were removed.  The patient's arms were untucked and the upper chest and breasts were prepped and draped in sterile fashion.  A second time out was performed.  Dr. Iran Planas marked  the borders of the breasts and the incisions bilaterally.  The right side was addressed first.  A triagular incision was made with the #10 blade.  Mastectomy hooks were used to provide elevation of the skin edges, and the cautery was used to create the mastectomy flaps.  The dissection was taken to the fascia of the pectoralis major.  The penetrating vessels were clipped.  The superior flap was taken medially to the lateral sternal border, superiorly to the inferior border of the  clavicle.  The inferior flap was similarly created, inferiorly to the inframammary fold and laterally to the border of the latissimus.  The breast was taken off including the pectoralis fascia and the axillary tail marked.  Hemostasis was achieved with the cautery.    The left side was then addressed similarly.  The superomedial flap was a bit thick, so additional breast tissue was taken from this location.  Hemostasis was achieved.    Using a hand-held gamma probe, left axillary sentinel nodes were identified.  Three deep level 2 axillary sentinel nodes were removed and submitted to pathology.  The findings are below.  The lymphovascular channels were clipped with metal clips.    Hemostasis was achieved.      The patient was left with Dr. Iran Planas for bilateral expander reconstruction.  She will perform drain placement and closure as well as dictate the remainder of the case.      Findings: grossly clear surgical margins.  Right subclavian port catheter cut at 14.5 cm.  SLN #1 hot with cps 670; SLN #2 cps 50; SLN #3 cps 881  Estimated Blood Loss: 100 mL          Drains: per Dr. Iran Planas                Specimens: right breast, left breast, additional left breast superomedial margin and three deep left axillary sentinel nodes         Complications:  None; patient tolerated the procedure well.         Disposition: PACU - hemodynamically stable.         Condition: stable

## 2020-01-09 NOTE — Interval H&P Note (Signed)
History and Physical Interval Note:  01/09/2020 9:02 AM  Amanda Burns  has presented today for surgery, with the diagnosis of LEFT BREAST CANCER.  The various methods of treatment have been discussed with the patient and family. After consideration of risks, benefits and other options for treatment, the patient has consented to  Procedure(s) with comments: BILATERAL MASTECTOMY WITH LEFT SENTINEL LYMPH NODE BIOPSY (Bilateral) - PECTORAL BLOCK, RNFA INSERTION PORT-A-CATH WITH ULTRASOUND GUIDANCE (N/A) (Daivion Pape) BREAST RECONSTRUCTION WITH PLACEMENT OF TISSUE EXPANDER AND FLEX HD (ACELLULAR HYDRATED DERMIS) (Bilateral) (Thimmappa) as a surgical intervention.  The patient's history has been reviewed, patient examined, no change in status, stable for surgery.  I have reviewed the patient's chart and labs.  Questions were answered to the patient's satisfaction.     [name]

## 2020-01-10 ENCOUNTER — Encounter (HOSPITAL_COMMUNITY): Payer: Self-pay | Admitting: General Surgery

## 2020-01-10 DIAGNOSIS — C50912 Malignant neoplasm of unspecified site of left female breast: Secondary | ICD-10-CM | POA: Diagnosis not present

## 2020-01-10 NOTE — Discharge Summary (Addendum)
Physician Discharge Summary  Patient ID: Amanda Burns MRN: [id number] DOB/AGE: 1980-10-21 39 y.o.  Admit date: 01/09/2020 Discharge date: 01/10/2020  Admission Diagnoses: Left breast cancer  Discharge Diagnoses:  Active Problems:   Breast cancer, left breast [facility])   Discharged Condition: stable  Hospital Course: Post operatively patient did well tolerating diet, ambulatory with minimal assist, and pain controlled. Instructed on bathing and drain care.  Treatments: surgery: bilateral skin reduction pattern mastectomies left sentinel node port placement bilateral breast reconstruction with tissue expanders acellular dermis 10.28.21  Discharge Exam: Blood pressure 121/63, pulse 75, temperature 98.4 F (36.9 C), temperature source Oral, resp. rate 17, height 5\' 1"  (1.549 m), weight 80.3 kg, SpO2 100 %, unknown if currently breastfeeding. Incision/Wound: chest soft bilateral incisions intact no drainage drains serosanguinous  Disposition: Discharge disposition: 01-Home or Self Care       Discharge Instructions    Call MD for:  redness, tenderness, or signs of infection (pain, swelling, bleeding, redness, odor or [place]/yellow discharge around incision site)   Complete by: As directed    Call MD for:  temperature >100.5   Complete by: As directed    Discharge instructions   Complete by: As directed    Ok to remove dressings and shower am 10.30.21. Soap and water ok, pat Tegaderms dry. Do not remove Tegaderms. No creams or ointments over incisions. Do not let drains dangle in shower, attach to lanyard or similar.Strip and record drains twice daily and bring log to clinic visit.  Breast binder or soft compression bra all other times.  Ok to raise arms above shoulders for bathing and dressing.  No house yard work or exercise until cleared by MD.   Patient received all Rx preop. Recommend ibuprofen with meals to aid with pain control. Also ok to use tylenol for pain. Ice packs to  chest for comfort as desired. Recommend Miralax or Dulcolax as needed for constipation   Driving Restrictions   Complete by: As directed    No driving for 2 weeks then no driving if taking narcotics   Lifting restrictions   Complete by: As directed    No lifting > 5 lbs until cleared by MD   Resume previous diet   Complete by: As directed      Allergies as of 01/10/2020      Reactions   Adhesive [tape] Other (See Comments)   Patient states that the steri-strips used on her breasts after her biopsies "tore her skin".   Benzoyl Peroxide Itching, Swelling   Hydrocodone Itching      Medication List    STOP taking these medications   doxycycline 20 MG tablet Commonly known as: PERIOSTAT     TAKE these medications   acetaminophen 500 MG tablet Commonly known as: TYLENOL Take 1,000 mg by mouth every 6 (six) hours as needed for moderate pain or headache.   Adapalene 0.3 % gel Apply 1 application topically at bedtime as needed (acne).   amphetamine-dextroamphetamine 20 MG 24 hr capsule Commonly known as: ADDERALL XR Take 20 mg by mouth daily.   bismuth subsalicylate 597 CB/63AG suspension Commonly known as: PEPTO BISMOL Take 30 mLs by mouth every 6 (six) hours as needed for indigestion or diarrhea or loose stools.   buPROPion 150 MG 24 hr tablet Commonly known as: WELLBUTRIN XL Take 150 mg by mouth daily.   dexamethasone 4 MG tablet Commonly known as: DECADRON Take 1 tablet day before chemo and 1 tablet day after chemo with food  escitalopram 20 MG tablet Commonly known as: LEXAPRO Take 20 mg by mouth daily.   fluticasone 50 MCG/ACT nasal spray Commonly known as: FLONASE Place 1 spray into both nostrils daily as needed for allergies or rhinitis.   ibuprofen 200 MG tablet Commonly known as: ADVIL Take 400 mg by mouth every 6 (six) hours as needed for headache or moderate pain.   Ibuprofen PM 200-25 MG Caps Generic drug: Ibuprofen-diphenhydrAMINE HCl Take 2  tablets by mouth at bedtime as needed (sleep).   lidocaine-prilocaine cream Commonly known as: EMLA Apply to affected area once What changed:   how much to take  how to take this  when to take this  reasons to take this  additional instructions   LORazepam 0.5 MG tablet Commonly known as: Ativan Take 1 tablet (0.5 mg total) by mouth at bedtime as needed for sleep.   multivitamin with minerals tablet Take 1 tablet by mouth daily.   omeprazole 20 MG capsule Commonly known as: PRILOSEC Take 20 mg by mouth daily.   ondansetron 8 MG tablet Commonly known as: Zofran Take 1 tablet (8 mg total) by mouth 2 (two) times daily as needed. Start on the third day after chemotherapy.   prochlorperazine 10 MG tablet Commonly known as: COMPAZINE Take 1 tablet (10 mg total) by mouth every 6 (six) hours as needed (Nausea or vomiting).       Follow-up Information    Irene Limbo, MD In 1 week.   Specialty: Plastic Surgery Why: as scheduled Contact information: [address] [place] [address] [phone]        [name], MD. Schedule an appointment as soon as possible for a visit in 2 weeks.   Specialty: General Surgery Contact information: [address] [place] [address] [phone]               Signed: Irene Limbo 01/10/2020, 7:15 AM

## 2020-01-13 ENCOUNTER — Encounter: Payer: Self-pay | Admitting: *Deleted

## 2020-01-16 ENCOUNTER — Encounter: Payer: Self-pay | Admitting: *Deleted

## 2020-01-16 LAB — SURGICAL PATHOLOGY

## 2020-01-16 NOTE — Progress Notes (Signed)
Patient Care Team: [name], PA as PCP - General (Family Medicine) [name], RN as Oncology Nurse Navigator [name], RN as Oncology Nurse Navigator [name], MD as Consulting Physician (General Surgery) [name], MD as Consulting Physician (Hematology and Oncology) [name], MD as Consulting Physician (Radiation Oncology)  DIAGNOSIS:    ICD-10-CM   1. Malignant neoplasm of upper-outer quadrant of left breast in female, estrogen receptor negative ([place])  C50.412    Z17.1     SUMMARY OF ONCOLOGIC HISTORY: Oncology History  Malignant neoplasm of upper-outer quadrant of left breast in female, estrogen receptor negative ([place])  11/29/2019 Initial Diagnosis   Patient palpated an area of concern in the left breast. Diagnostic mammogram and US showed a 1.8cm mass in the upper outer right breast representing a fibroadenoma, and in the left breast, three adjacent masses at the 2-2:30 position, 1.7cm, 0.8cm, and 1.4cm, with several cysts and no axillary adenopathy. Biopsy showed a benign fibroadenoma in the right breast, and in the left breast, IDC at the 2:30 position, grade 3, HER-2 negative (1+), ER+ 0%, PR+ 0%, Ki67 80%.    12/04/2019 Cancer Staging   Staging form: Breast, AJCC 8th Edition - Clinical: Stage IB (cT1c, cN0, cM0, G3, ER-, PR-, HER2-) - Signed by [name], MD on 12/04/2019   12/19/2019 -  Chemotherapy   The patient had dexamethasone (DECADRON) 4 MG tablet, 1 of 1 cycle, Start date: 12/04/2019, End date: -- DOXOrubicin (ADRIAMYCIN) chemo injection 112 mg, 60 mg/m2 = 112 mg, Intravenous,  Once, 0 of 4 cycles palonosetron (ALOXI) injection 0.25 mg, 0.25 mg, Intravenous,  Once, 0 of 8 cycles pegfilgrastim-jmdb (FULPHILA) injection 6 mg, 6 mg, Subcutaneous,  Once, 0 of 4 cycles CARBOplatin (PARAPLATIN) 700 mg in sodium chloride 0.9 % 250 mL chemo infusion, 700 mg (100 % of original dose 700 mg), Intravenous,  Once, 0 of 4 cycles Dose modification:  700 mg (original dose 700 mg, Cycle 5) cyclophosphamide (CYTOXAN) 1,120 mg in sodium chloride 0.9 % 250 mL chemo infusion, 600 mg/m2 = 1,120 mg, Intravenous,  Once, 0 of 4 cycles PACLitaxel (TAXOL) 150 mg in sodium chloride 0.9 % 250 mL chemo infusion (</= $RemoveBefor'80mg'UPKicPGcWYDd$ /m2), 80 mg/m2 = 150 mg, Intravenous,  Once, 0 of 4 cycles fosaprepitant (EMEND) 150 mg in sodium chloride 0.9 % 145 mL IVPB, 150 mg, Intravenous,  Once, 0 of 8 cycles pembrolizumab (KEYTRUDA) 200 mg in sodium chloride 0.9 % 50 mL chemo infusion, 200 mg (100 % of original dose 200 mg), Intravenous, Once, 0 of 8 cycles Dose modification: 200 mg (original dose 200 mg, Cycle 1, Reason: Provider Judgment), 200 mg (original dose 200 mg, Cycle 5, Reason: Provider Judgment)  for chemotherapy treatment.    12/21/2019 Genetic Testing   Negative genetic testing:  No pathogenic variants detected on the [facility] Multi-Cancer Panel. A variant of uncertain significance (VUS) was detected in the CDK4 gene called c.415C>T (p.Arg139*). The report date is 12/21/2019.  The Multi-Cancer Panel offered by [facility] includes sequencing and/or deletion duplication testing of the following 85 genes: AIP, ALK, APC, ATM, AXIN2,BAP1,  BARD1, BLM, BMPR1A, BRCA1, BRCA2, BRIP1, CASR, CDC73, CDH1, CDK4, CDKN1B, CDKN1C, CDKN2A (p14ARF), CDKN2A (p16INK4a), CEBPA, CHEK2, CTNNA1, DICER1, DIS3L2, EGFR (c.2369C>T, p.Thr790Met variant only), EPCAM (Deletion/duplication testing only), FH, FLCN, GATA2, GPC3, GREM1 (Promoter region deletion/duplication testing only), HOXB13 (c.251G>A, p.Gly84Glu), HRAS, KIT, MAX, MEN1, MET, MITF (c.952G>A, p.Glu318Lys variant only), MLH1, MSH2, MSH3, MSH6, MUTYH, NBN, NF1, NF2, NTHL1, PALB2, PDGFRA, PHOX2B, PMS2,  POLD1, POLE, POT1, PRKAR1A, PTCH1, PTEN, RAD50, RAD51C, RAD51D, RB1, RECQL4, RET, RNF43, RUNX1, SDHAF2, SDHA (sequence changes only), SDHB, SDHC, SDHD, SMAD4, SMARCA4, SMARCB1, SMARCE1, STK11, SUFU, TERC, TERT, TMEM127, TP53, TSC1, TSC2, VHL, WRN and  WT1.    01/09/2020 Surgery   Bilateral mastectomies with reconstruction Barry Dienes & Thimmappa):  Right breast: no evidence of malignancy Left breast: multifocal invasive ductal carcinoma, grade 3, largest 2.2cm, with high grade DCIS, clear margins, 4 left axillary lymph nodes negative for carcinoma.     CHIEF COMPLIANT: Follow-up s/p bilateral mastectomies   INTERVAL HISTORY: Amanda Burns is a 39 y.o. with above-mentioned history of left breast cancer. She underwent bilateral mastectomies with reconstruction with Dr. Barry Dienes and Dr. Iran Planas on 01/09/20 for which pathology showed in the right breast, no evidence of malignancy, and in the left breast, multifocal invasive ductal carcinoma, grade 3, largest 2.2cm, with high grade DCIS, clear margins, 4 left axillary lymph nodes negative for carcinoma. She presents to the clinic today to review the pathology report and discuss further treatment.   ALLERGIES:  is allergic to adhesive [tape], benzoyl peroxide, and hydrocodone.  MEDICATIONS:  Current Outpatient Medications  Medication Sig Dispense Refill  . acetaminophen (TYLENOL) 500 MG tablet Take 1,000 mg by mouth every 6 (six) hours as needed for moderate pain or headache.    . Adapalene 0.3 % gel Apply 1 application topically at bedtime as needed (acne).     Marland Kitchen amphetamine-dextroamphetamine (ADDERALL XR) 20 MG 24 hr capsule Take 20 mg by mouth daily.     Marland Kitchen bismuth subsalicylate (PEPTO BISMOL) 262 MG/15ML suspension Take 30 mLs by mouth every 6 (six) hours as needed for indigestion or diarrhea or loose stools.    Marland Kitchen buPROPion (WELLBUTRIN XL) 150 MG 24 hr tablet Take 150 mg by mouth daily.    Marland Kitchen dexamethasone (DECADRON) 4 MG tablet Take 1 tablet day before chemo and 1 tablet day after chemo with food 30 tablet 1  . escitalopram (LEXAPRO) 20 MG tablet Take 20 mg by mouth daily.    . fluticasone (FLONASE) 50 MCG/ACT nasal spray Place 1 spray into both nostrils daily as needed for allergies or  rhinitis.    Marland Kitchen ibuprofen (ADVIL) 200 MG tablet Take 400 mg by mouth every 6 (six) hours as needed for headache or moderate pain.    . Ibuprofen-diphenhydrAMINE HCl (IBUPROFEN PM) 200-25 MG CAPS Take 2 tablets by mouth at bedtime as needed (sleep).    Marland Kitchen lidocaine-prilocaine (EMLA) cream Apply to affected area once (Patient taking differently: Apply 1 application topically daily as needed (port access). ) 30 g 3  . LORazepam (ATIVAN) 0.5 MG tablet Take 1 tablet (0.5 mg total) by mouth at bedtime as needed for sleep. 30 tablet 0  . Multiple Vitamins-Minerals (MULTIVITAMIN WITH MINERALS) tablet Take 1 tablet by mouth daily.    Marland Kitchen omeprazole (PRILOSEC) 20 MG capsule Take 20 mg by mouth daily.    . ondansetron (ZOFRAN) 8 MG tablet Take 1 tablet (8 mg total) by mouth 2 (two) times daily as needed. Start on the third day after chemotherapy. 30 tablet 1  . prochlorperazine (COMPAZINE) 10 MG tablet Take 1 tablet (10 mg total) by mouth every 6 (six) hours as needed (Nausea or vomiting). 30 tablet 1   No current facility-administered medications for this visit.    PHYSICAL EXAMINATION: ECOG PERFORMANCE STATUS: 1 - Symptomatic but completely ambulatory  Vitals:   01/17/20 1208  BP: 125/83  Pulse: 99  Resp: 19  Temp: 97.7 F (36.5 C)  SpO2: 99%   Filed Weights   01/17/20 1208  Weight: 176 lb 3.2 oz (79.9 kg)    LABORATORY DATA:  I have reviewed the data as listed CMP Latest Ref Rng & Units 01/03/2020 12/04/2019  Glucose 70 - 99 mg/dL 101(H) 102(H)  BUN 6 - 20 mg/dL 9 8  Creatinine 0.44 - 1.00 mg/dL 0.78 0.90  Sodium 135 - 145 mmol/L 136 136  Potassium 3.5 - 5.1 mmol/L 3.6 4.0  Chloride 98 - 111 mmol/L 102 102  CO2 22 - 32 mmol/L 24 29  Calcium 8.9 - 10.3 mg/dL 9.1 9.8  Total Protein 6.5 - 8.1 g/dL 7.7 7.9  Total Bilirubin 0.3 - 1.2 mg/dL 0.6 0.4  Alkaline Phos 38 - 126 U/L 89 107  AST 15 - 41 U/L 26 19  ALT 0 - 44 U/L 40 24    Lab Results  Component Value Date   WBC 7.3 01/03/2020     HGB 12.2 01/03/2020   HCT 39.3 01/03/2020   MCV 80.7 01/03/2020   PLT 455 (H) 01/03/2020   NEUTROABS 5.4 12/04/2019    ASSESSMENT & PLAN:  Malignant neoplasm of upper-outer quadrant of left breast in female, estrogen receptor negative ([place]) Palpable left breast mass: 1.8 cm UOQ fibroadenoma in the right breast, left breast 3 masses 2 to 2:30 position: 1.7, 1.8, 1.4 cm.  Biopsy revealed IDC grade 3, ER 0%, PR 0%, Ki-67 80%, HER-2 -1+  T1c M0 stage IB  Treatment plan: 1.    Bilateral mastectomies 01/09/2020: Right mastectomy:PASH Left mastectomy: Multifocal IDC grade 3 2.2 cm largest, high-grade DCIS, margins negative, 0/4 sentinel lymph nodes, ER negative, PR negative, HER-2 negative, Ki-67 80% 2.  I recommended adjuvant chemotherapy with dose dense Adriamycin and Cytoxan followed by Taxol 3.  Adjuvant radiation  Chemo counseling: Discussed risks and benefits of systemic chemotherapy. Including hair loss, nausea, cardiotoxicity from Adriamycin, neuropathy from Taxol, fatigue, loss of hair, blood counts issues including leukopenia and anemia. We will also monitor for liver and kidney function abnormalities. She understands this risks and is willing to proceed.  Return to clinic in January to start chemotherapy.  No orders of the defined types were placed in this encounter.  The patient has a good understanding of the overall plan. she agrees with it. she will call with any problems that may develop before the next visit here.  Total time spent: 45  mins including face to face time and time spent for planning, charting and coordination of care  [name], MD 01/17/2020  I, Cloyde Reams Dorshimer, am acting as scribe for Dr. [name].  I have reviewed the above documentation for accuracy and completeness, and I agree with the above.

## 2020-01-17 ENCOUNTER — Other Ambulatory Visit: Payer: Self-pay

## 2020-01-17 ENCOUNTER — Inpatient Hospital Stay: Payer: BC Managed Care – PPO | Attending: Hematology and Oncology | Admitting: Hematology and Oncology

## 2020-01-17 DIAGNOSIS — C50412 Malignant neoplasm of upper-outer quadrant of left female breast: Secondary | ICD-10-CM | POA: Diagnosis present

## 2020-01-17 DIAGNOSIS — Z171 Estrogen receptor negative status [ER-]: Secondary | ICD-10-CM

## 2020-01-17 DIAGNOSIS — Z803 Family history of malignant neoplasm of breast: Secondary | ICD-10-CM | POA: Diagnosis not present

## 2020-01-17 DIAGNOSIS — Z9013 Acquired absence of bilateral breasts and nipples: Secondary | ICD-10-CM | POA: Diagnosis not present

## 2020-01-17 DIAGNOSIS — D241 Benign neoplasm of right breast: Secondary | ICD-10-CM | POA: Insufficient documentation

## 2020-01-17 NOTE — Assessment & Plan Note (Signed)
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  Biopsy revealed IDC grade 3, ER 0%, PR 0%, Ki-67 80%, HER-2 -1+  T1c M0 stage IB  Treatment plan: 1.    Bilateral mastectomies 01/09/2020: Right mastectomy:PASH Left mastectomy: Multifocal IDC grade 3 2.2 cm largest, high-grade DCIS, margins negative, 0/4 sentinel lymph nodes, ER negative, PR negative, HER-2 negative, Ki-67 80% 2.  I recommended adjuvant chemotherapy with dose consider medicine Cytoxan followed by Taxol 3.  Adjuvant radiation

## 2020-01-20 ENCOUNTER — Encounter: Payer: Self-pay | Admitting: *Deleted

## 2020-01-23 ENCOUNTER — Other Ambulatory Visit: Payer: BC Managed Care – PPO

## 2020-01-23 ENCOUNTER — Ambulatory Visit: Payer: BC Managed Care – PPO | Admitting: Hematology and Oncology

## 2020-01-23 ENCOUNTER — Ambulatory Visit: Payer: BC Managed Care – PPO

## 2020-01-25 ENCOUNTER — Ambulatory Visit: Payer: BC Managed Care – PPO

## 2020-02-13 ENCOUNTER — Ambulatory Visit: Payer: BC Managed Care – PPO | Admitting: Hematology and Oncology

## 2020-02-13 ENCOUNTER — Other Ambulatory Visit: Payer: BC Managed Care – PPO

## 2020-02-13 ENCOUNTER — Ambulatory Visit: Payer: BC Managed Care – PPO

## 2020-02-15 ENCOUNTER — Ambulatory Visit: Payer: BC Managed Care – PPO

## 2020-02-18 ENCOUNTER — Telehealth: Payer: Self-pay | Admitting: *Deleted

## 2020-02-18 NOTE — Telephone Encounter (Signed)
Confirmed chemo appts and nadir check. No further questions or needs at this time.

## 2020-03-17 ENCOUNTER — Telehealth: Payer: Self-pay | Admitting: *Deleted

## 2020-03-17 NOTE — Telephone Encounter (Signed)
Received call from patient stating she is to start chemo tomorrow but she has some nasal congestion and went to see her doctor and they have put her some antibiotics.  She states she did home test for COVID last Tuesday and it was negative.  I informed her that since it has been a week she needs to get another rapid test before coming in tomorrow for chemo.  Patient verbalized understanding.    Called [facility] pharmacy and they have 2 tests left. They will hold one for her to pick up.  Informed her to call me and leave a message if after hours with results.  Received message at 6:57pm that she was negative with the home test and that she will be at her appointment tomorrow.

## 2020-03-18 ENCOUNTER — Inpatient Hospital Stay: Payer: BC Managed Care – PPO | Admitting: Hematology and Oncology

## 2020-03-18 ENCOUNTER — Other Ambulatory Visit: Payer: Self-pay

## 2020-03-18 ENCOUNTER — Encounter: Payer: Self-pay | Admitting: *Deleted

## 2020-03-18 ENCOUNTER — Inpatient Hospital Stay: Payer: BC Managed Care – PPO | Attending: Hematology and Oncology

## 2020-03-18 ENCOUNTER — Inpatient Hospital Stay: Payer: BC Managed Care – PPO

## 2020-03-18 DIAGNOSIS — Z9013 Acquired absence of bilateral breasts and nipples: Secondary | ICD-10-CM | POA: Insufficient documentation

## 2020-03-18 DIAGNOSIS — D241 Benign neoplasm of right breast: Secondary | ICD-10-CM | POA: Insufficient documentation

## 2020-03-18 DIAGNOSIS — R5383 Other fatigue: Secondary | ICD-10-CM | POA: Insufficient documentation

## 2020-03-18 DIAGNOSIS — C50412 Malignant neoplasm of upper-outer quadrant of left female breast: Secondary | ICD-10-CM

## 2020-03-18 DIAGNOSIS — Z171 Estrogen receptor negative status [ER-]: Secondary | ICD-10-CM | POA: Diagnosis not present

## 2020-03-18 DIAGNOSIS — Z803 Family history of malignant neoplasm of breast: Secondary | ICD-10-CM | POA: Diagnosis not present

## 2020-03-18 DIAGNOSIS — D649 Anemia, unspecified: Secondary | ICD-10-CM | POA: Insufficient documentation

## 2020-03-18 DIAGNOSIS — Z5189 Encounter for other specified aftercare: Secondary | ICD-10-CM | POA: Diagnosis not present

## 2020-03-18 DIAGNOSIS — Z5112 Encounter for antineoplastic immunotherapy: Secondary | ICD-10-CM | POA: Diagnosis not present

## 2020-03-18 DIAGNOSIS — Z95828 Presence of other vascular implants and grafts: Secondary | ICD-10-CM

## 2020-03-18 DIAGNOSIS — E039 Hypothyroidism, unspecified: Secondary | ICD-10-CM | POA: Diagnosis not present

## 2020-03-18 DIAGNOSIS — Z79899 Other long term (current) drug therapy: Secondary | ICD-10-CM | POA: Insufficient documentation

## 2020-03-18 LAB — CMP (CANCER CENTER ONLY)
ALT: 33 U/L (ref 0–44)
AST: 21 U/L (ref 15–41)
Albumin: 3.8 g/dL (ref 3.5–5.0)
Alkaline Phosphatase: 143 U/L — ABNORMAL HIGH (ref 38–126)
Anion gap: 8 (ref 5–15)
BUN: 10 mg/dL (ref 6–20)
CO2: 24 mmol/L (ref 22–32)
Calcium: 9.1 mg/dL (ref 8.9–10.3)
Chloride: 105 mmol/L (ref 98–111)
Creatinine: 0.85 mg/dL (ref 0.44–1.00)
GFR, Estimated: >60 mL/min (ref >60)
Glucose, Bld: 123 mg/dL — ABNORMAL HIGH (ref 70–99)
Potassium: 4 mmol/L (ref 3.5–5.1)
Sodium: 137 mmol/L (ref 135–145)
Total Bilirubin: 0.3 mg/dL (ref 0.3–1.2)
Total Protein: 7.4 g/dL (ref 6.5–8.1)

## 2020-03-18 LAB — CBC WITH DIFFERENTIAL (CANCER CENTER ONLY)
Abs Immature Granulocytes: 0.03 10*3/uL (ref 0.00–0.07)
Basophils Absolute: 0.1 10*3/uL (ref 0.0–0.1)
Basophils Relative: 1 %
Eosinophils Absolute: 0.1 10*3/uL (ref 0.0–0.5)
Eosinophils Relative: 1 %
HCT: 31.9 % — ABNORMAL LOW (ref 36.0–46.0)
Hemoglobin: 9.8 g/dL — ABNORMAL LOW (ref 12.0–15.0)
Immature Granulocytes: 0 %
Lymphocytes Relative: 11 %
Lymphs Abs: 1.2 10*3/uL (ref 0.7–4.0)
MCH: 23.2 pg — ABNORMAL LOW (ref 26.0–34.0)
MCHC: 30.7 g/dL (ref 30.0–36.0)
MCV: 75.4 fL — ABNORMAL LOW (ref 80.0–100.0)
Monocytes Absolute: 0.2 10*3/uL (ref 0.1–1.0)
Monocytes Relative: 2 %
Neutro Abs: 9.7 10*3/uL — ABNORMAL HIGH (ref 1.7–7.7)
Neutrophils Relative %: 85 %
Platelet Count: 497 10*3/uL — ABNORMAL HIGH (ref 150–400)
RBC: 4.23 MIL/uL (ref 3.87–5.11)
RDW: 14.1 % (ref 11.5–15.5)
WBC Count: 11.4 10*3/uL — ABNORMAL HIGH (ref 4.0–10.5)
nRBC: 0 % (ref 0.0–0.2)

## 2020-03-18 LAB — TSH: TSH: 0.567 u[IU]/mL (ref 0.308–3.960)

## 2020-03-18 MED ORDER — HEPARIN SOD (PORK) LOCK FLUSH 100 UNIT/ML IV SOLN
500.0000 [IU] | Freq: Once | INTRAVENOUS | Status: AC | PRN
  Administered 2020-03-18: 500 [IU]
  Filled 2020-03-18: qty 5

## 2020-03-18 MED ORDER — DOXORUBICIN HCL CHEMO IV INJECTION 2 MG/ML
60.0000 mg/m2 | Freq: Once | INTRAVENOUS | Status: AC
  Administered 2020-03-18: 112 mg via INTRAVENOUS
  Filled 2020-03-18: qty 56

## 2020-03-18 MED ORDER — PALONOSETRON HCL INJECTION 0.25 MG/5ML
0.2500 mg | Freq: Once | INTRAVENOUS | Status: AC
  Administered 2020-03-18: 0.25 mg via INTRAVENOUS

## 2020-03-18 MED ORDER — SODIUM CHLORIDE 0.9 % IV SOLN
Freq: Once | INTRAVENOUS | Status: AC
  Filled 2020-03-18: qty 250

## 2020-03-18 MED ORDER — ALTEPLASE 2 MG IJ SOLR
INTRAMUSCULAR | Status: AC
  Filled 2020-03-18: qty 2

## 2020-03-18 MED ORDER — SODIUM CHLORIDE 0.9 % IV SOLN
150.0000 mg | Freq: Once | INTRAVENOUS | Status: AC
  Administered 2020-03-18: 150 mg via INTRAVENOUS
  Filled 2020-03-18: qty 150

## 2020-03-18 MED ORDER — SODIUM CHLORIDE 0.9 % IV SOLN
200.0000 mg | Freq: Once | INTRAVENOUS | Status: AC
  Administered 2020-03-18: 200 mg via INTRAVENOUS
  Filled 2020-03-18: qty 8

## 2020-03-18 MED ORDER — CYCLOPHOSPHAMIDE CHEMO INJECTION 1 GM
600.0000 mg/m2 | Freq: Once | INTRAMUSCULAR | Status: AC
  Administered 2020-03-18: 1120 mg via INTRAVENOUS
  Filled 2020-03-18: qty 56

## 2020-03-18 MED ORDER — SODIUM CHLORIDE 0.9% FLUSH
10.0000 mL | INTRAVENOUS | Status: DC | PRN
  Administered 2020-03-18: 10 mL
  Filled 2020-03-18: qty 10

## 2020-03-18 MED ORDER — SODIUM CHLORIDE 0.9 % IV SOLN
10.0000 mg | Freq: Once | INTRAVENOUS | Status: AC
  Administered 2020-03-18: 10 mg via INTRAVENOUS
  Filled 2020-03-18: qty 10

## 2020-03-18 MED ORDER — ALTEPLASE 2 MG IJ SOLR
2.0000 mg | Freq: Once | INTRAMUSCULAR | Status: AC | PRN
  Administered 2020-03-18: 2 mg
  Filled 2020-03-18: qty 2

## 2020-03-18 MED ORDER — PALONOSETRON HCL INJECTION 0.25 MG/5ML
INTRAVENOUS | Status: AC
  Filled 2020-03-18: qty 5

## 2020-03-18 MED ORDER — SODIUM CHLORIDE 0.9% FLUSH
10.0000 mL | INTRAVENOUS | Status: AC | PRN
  Administered 2020-03-18: 10 mL via INTRAVENOUS
  Filled 2020-03-18: qty 10

## 2020-03-18 NOTE — Progress Notes (Signed)
Patient Care Team: [name], PA as PCP - General (Family Medicine) [name], RN as Oncology Nurse Navigator [name], RN as Oncology Nurse Navigator [name], MD as Consulting Physician (General Surgery) [name], MD as Consulting Physician (Hematology and Oncology) [name], MD as Consulting Physician (Radiation Oncology)  DIAGNOSIS:    ICD-10-CM   1. Malignant neoplasm of upper-outer quadrant of left breast in female, estrogen receptor negative ([facility])  C50.412 Iron and TIBC   Z17.1 Ferritin    SUMMARY OF ONCOLOGIC HISTORY: Oncology History  Malignant neoplasm of upper-outer quadrant of left breast in female, estrogen receptor negative ([facility])  11/29/2019 Initial Diagnosis   Patient palpated an area of concern in the left breast. Diagnostic mammogram and US showed a 1.8cm mass in the upper outer right breast representing a fibroadenoma, and in the left breast, three adjacent masses at the 2-2:30 position, 1.7cm, 0.8cm, and 1.4cm, with several cysts and no axillary adenopathy. Biopsy showed a benign fibroadenoma in the right breast, and in the left breast, IDC at the 2:30 position, grade 3, HER-2 negative (1+), ER+ 0%, PR+ 0%, Ki67 80%.    12/04/2019 Cancer Staging   Staging form: Breast, AJCC 8th Edition - Clinical: Stage IB (cT1c, cN0, cM0, G3, ER-, PR-, HER2-) - Signed by [name], MD on 12/04/2019   12/21/2019 Genetic Testing   Negative genetic testing:  No pathogenic variants detected on the [facility] Multi-Cancer Panel. A variant of uncertain significance (VUS) was detected in the CDK4 gene called c.415C>T (p.Arg139*). The report date is 12/21/2019.  The Multi-Cancer Panel offered by [facility] includes sequencing and/or deletion duplication testing of the following 85 genes: AIP, ALK, APC, ATM, AXIN2,BAP1,  BARD1, BLM, BMPR1A, BRCA1, BRCA2, BRIP1, CASR, CDC73, CDH1, CDK4, CDKN1B, CDKN1C, CDKN2A (p14ARF), CDKN2A (p16INK4a), CEBPA, CHEK2, CTNNA1,  DICER1, DIS3L2, EGFR (c.2369C>T, p.Thr790Met variant only), EPCAM (Deletion/duplication testing only), FH, FLCN, GATA2, GPC3, GREM1 (Promoter region deletion/duplication testing only), HOXB13 (c.251G>A, p.Gly84Glu), HRAS, KIT, MAX, MEN1, MET, MITF (c.952G>A, p.Glu318Lys variant only), MLH1, MSH2, MSH3, MSH6, MUTYH, NBN, NF1, NF2, NTHL1, PALB2, PDGFRA, PHOX2B, PMS2, POLD1, POLE, POT1, PRKAR1A, PTCH1, PTEN, RAD50, RAD51C, RAD51D, RB1, RECQL4, RET, RNF43, RUNX1, SDHAF2, SDHA (sequence changes only), SDHB, SDHC, SDHD, SMAD4, SMARCA4, SMARCB1, SMARCE1, STK11, SUFU, TERC, TERT, TMEM127, TP53, TSC1, TSC2, VHL, WRN and WT1.    01/09/2020 Surgery   Bilateral mastectomies with reconstruction [facility]):  Right breast: no evidence of malignancy Left breast: multifocal invasive ductal carcinoma, grade 3, largest 2.2cm, with high grade DCIS, clear margins, 4 left axillary lymph nodes negative for carcinoma.   03/18/2020 -  Chemotherapy   The patient had dexamethasone (DECADRON) 4 MG tablet, 1 of 1 cycle, Start date: 12/04/2019, End date: -- DOXOrubicin (ADRIAMYCIN) chemo injection 112 mg, 60 mg/m2 = 112 mg, Intravenous,  Once, 1 of 4 cycles palonosetron (ALOXI) injection 0.25 mg, 0.25 mg, Intravenous,  Once, 1 of 8 cycles pegfilgrastim-jmdb (FULPHILA) injection 6 mg, 6 mg, Subcutaneous,  Once, 1 of 4 cycles cyclophosphamide (CYTOXAN) 1,120 mg in sodium chloride 0.9 % 250 mL chemo infusion, 600 mg/m2 = 1,120 mg, Intravenous,  Once, 1 of 4 cycles PACLitaxel (TAXOL) 150 mg in sodium chloride 0.9 % 250 mL chemo infusion (</= 41m/m2), 80 mg/m2 = 150 mg, Intravenous,  Once, 0 of 4 cycles fosaprepitant (EMEND) 150 mg in sodium chloride 0.9 % 145 mL IVPB, 150 mg, Intravenous,  Once, 1 of 8 cycles pembrolizumab (KEYTRUDA) 200 mg in sodium chloride 0.9 % 50 mL  chemo infusion, 200 mg (100 % of original dose 200 mg), Intravenous, Once, 1 of 8 cycles Dose modification: 200 mg (original dose 200 mg, Cycle 1, Reason:  Provider Judgment)  for chemotherapy treatment.      CHIEF COMPLIANT: Cycle 1 Adriamycin and Cytoxan  INTERVAL HISTORY: Amanda Burns is a 40 y.o. with above-mentioned history of left breast cancer who underwent bilateral mastectomies with reconstruction. She is currently on adjuvant chemotherapy with dose dense Adriamycin and Cytoxan. She presents to the clinic today to start her treatment.   She has shaved her head and is ready to get started with the treatment.  ALLERGIES:  is allergic to adhesive [tape], benzoyl peroxide, and hydrocodone.  MEDICATIONS:  Current Outpatient Medications  Medication Sig Dispense Refill  . acetaminophen (TYLENOL) 500 MG tablet Take 1,000 mg by mouth every 6 (six) hours as needed for moderate pain or headache.    . Adapalene 0.3 % gel Apply 1 application topically at bedtime as needed (acne).     Marland Kitchen amphetamine-dextroamphetamine (ADDERALL XR) 20 MG 24 hr capsule Take 20 mg by mouth daily.     Marland Kitchen bismuth subsalicylate (PEPTO BISMOL) 262 MG/15ML suspension Take 30 mLs by mouth every 6 (six) hours as needed for indigestion or diarrhea or loose stools.    Marland Kitchen buPROPion (WELLBUTRIN XL) 150 MG 24 hr tablet Take 150 mg by mouth daily.    Marland Kitchen dexamethasone (DECADRON) 4 MG tablet Take 1 tablet day before chemo and 1 tablet day after chemo with food 30 tablet 1  . escitalopram (LEXAPRO) 20 MG tablet Take 20 mg by mouth daily.    . fluticasone (FLONASE) 50 MCG/ACT nasal spray Place 1 spray into both nostrils daily as needed for allergies or rhinitis.    Marland Kitchen ibuprofen (ADVIL) 200 MG tablet Take 400 mg by mouth every 6 (six) hours as needed for headache or moderate pain.    . Ibuprofen-diphenhydrAMINE HCl (IBUPROFEN PM) 200-25 MG CAPS Take 2 tablets by mouth at bedtime as needed (sleep).    Marland Kitchen lidocaine-prilocaine (EMLA) cream Apply to affected area once (Patient taking differently: Apply 1 application topically daily as needed (port access). ) 30 g 3  . LORazepam (ATIVAN) 0.5 MG  tablet Take 1 tablet (0.5 mg total) by mouth at bedtime as needed for sleep. 30 tablet 0  . Multiple Vitamins-Minerals (MULTIVITAMIN WITH MINERALS) tablet Take 1 tablet by mouth daily.    Marland Kitchen omeprazole (PRILOSEC) 20 MG capsule Take 20 mg by mouth daily.    . ondansetron (ZOFRAN) 8 MG tablet Take 1 tablet (8 mg total) by mouth 2 (two) times daily as needed. Start on the third day after chemotherapy. 30 tablet 1  . prochlorperazine (COMPAZINE) 10 MG tablet Take 1 tablet (10 mg total) by mouth every 6 (six) hours as needed (Nausea or vomiting). 30 tablet 1   No current facility-administered medications for this visit.   Facility-Administered Medications Ordered in Other Visits  Medication Dose Route Frequency Provider Last Rate Last Admin  . cyclophosphamide (CYTOXAN) 1,120 mg in sodium chloride 0.9 % 250 mL chemo infusion  600 mg/m2 (Treatment Plan Recorded) Intravenous Once [name], MD      . DOXOrubicin (ADRIAMYCIN) chemo injection 112 mg  60 mg/m2 (Treatment Plan Recorded) Intravenous Once [name], MD      . heparin lock flush 100 unit/mL  500 Units Intracatheter Once PRN [name], MD      . sodium chloride flush (NS) 0.9 % injection 10 mL  10 mL Intravenous PRN Gudena, Vinay, MD   10 mL at 03/18/20 1033  . sodium chloride flush (NS) 0.9 % injection 10 mL  10 mL Intracatheter PRN Gudena, Vinay, MD        PHYSICAL EXAMINATION: ECOG PERFORMANCE STATUS: 1 - Symptomatic but completely ambulatory  Vitals:   03/18/20 1106  BP: 139/86  Pulse: 96  Resp: 18  Temp: (!) 97.4 F (36.3 C)  SpO2: 99%   Filed Weights   03/18/20 1106  Weight: 177 lb 9.6 oz (80.6 kg)    LABORATORY DATA:  I have reviewed the data as listed CMP Latest Ref Rng & Units 03/18/2020 01/03/2020 12/04/2019  Glucose 70 - 99 mg/dL 123(H) 101(H) 102(H)  BUN 6 - 20 mg/dL 10 9 8  Creatinine 0.44 - 1.00 mg/dL 0.85 0.78 0.90  Sodium 135 - 145 mmol/L 137 136 136  Potassium 3.5 - 5.1 mmol/L 4.0 3.6 4.0  Chloride  98 - 111 mmol/L 105 102 102  CO2 22 - 32 mmol/L 24 24 29  Calcium 8.9 - 10.3 mg/dL 9.1 9.1 9.8  Total Protein 6.5 - 8.1 g/dL 7.4 7.7 7.9  Total Bilirubin 0.3 - 1.2 mg/dL 0.3 0.6 0.4  Alkaline Phos 38 - 126 U/L 143(H) 89 107  AST 15 - 41 U/L 21 26 19  ALT 0 - 44 U/L 33 40 24    Lab Results  Component Value Date   WBC 11.4 (H) 03/18/2020   HGB 9.8 (L) 03/18/2020   HCT 31.9 (L) 03/18/2020   MCV 75.4 (L) 03/18/2020   PLT 497 (H) 03/18/2020   NEUTROABS 9.7 (H) 03/18/2020    ASSESSMENT & PLAN:  Malignant neoplasm of upper-outer quadrant of left breast in female, estrogen receptor negative ([facility]) Palpable left breast mass: 1.8 cm UOQ fibroadenoma in the right breast, left breast 3 masses 2 to 2:30 position: 1.7, 1.8, 1.4 cm. Biopsy revealed IDC grade 3, ER 0%, PR 0%, Ki-67 80%, HER-2 -1+  T1c M0 stage IB  Treatment plan: 1.  Bilateral mastectomies 01/09/2020: Right mastectomy:PASH Left mastectomy: Multifocal IDC grade 3 2.2 cm largest, high-grade DCIS, margins negative, 0/4 sentinel lymph nodes, ER negative, PR negative, HER-2 negative, Ki-67 80% 2. I recommended adjuvant chemotherapy with dose dense Adriamycin and Cytoxan followed by Taxol 3.Adjuvant radiation ----------------------------------------------------------------------------------------------------------------------------------- Current treatment: Cycle 1 dose dense Adriamycin and Cytoxan Labs reviewed Chemo education completed, chemo consent obtained Return to clinic in 1 week for toxicity check     Orders Placed This Encounter  Procedures  . Iron and TIBC    Standing Status:   Future    Standing Expiration Date:   03/18/2021  . Ferritin    Standing Status:   Future    Standing Expiration Date:   03/18/2021   The patient has a good understanding of the overall plan. she agrees with it. she will call with any problems that may develop before the next visit here.  Total time spent: 30 mins including face to  face time and time spent for planning, charting and coordination of care  Gudena, Vinay, MD 03/18/2020  I, Molly Dorshimer, am acting as scribe for Dr. Vinay Gudena.  I have reviewed the above documentation for accuracy and completeness, and I agree with the above.       

## 2020-03-18 NOTE — Patient Instructions (Addendum)
Cancer Center Discharge Instructions for Patients Receiving Chemotherapy  Today you received the following chemotherapy agents Keytruda, Adriamycin and Cytoxan.  To help prevent nausea and vomiting after your treatment, we encourage you to take your nausea medication as directed.   If you develop nausea and vomiting that is not controlled by your nausea medication, call the clinic.   BELOW ARE SYMPTOMS THAT SHOULD BE REPORTED IMMEDIATELY:  *FEVER GREATER THAN 100.5 F  *CHILLS WITH OR WITHOUT FEVER  NAUSEA AND VOMITING THAT IS NOT CONTROLLED WITH YOUR NAUSEA MEDICATION  *UNUSUAL SHORTNESS OF BREATH  *UNUSUAL BRUISING OR BLEEDING  TENDERNESS IN MOUTH AND THROAT WITH OR WITHOUT PRESENCE OF ULCERS  *URINARY PROBLEMS  *BOWEL PROBLEMS  UNUSUAL RASH Items with * indicate a potential emergency and should be followed up as soon as possible.  Feel free to call the clinic should you have any questions or concerns. The clinic phone number is [phone].  Please show the CHEMO ALERT CARD at check-in to the Emergency Department and triage nurse.  Doxorubicin injection What is this medicine? DOXORUBICIN (dox oh ROO bi sin) is a chemotherapy drug. It is used to treat many kinds of cancer like leukemia, lymphoma, neuroblastoma, sarcoma, and Wilms' tumor. It is also used to treat bladder cancer, breast cancer, lung cancer, ovarian cancer, stomach cancer, and thyroid cancer. This medicine may be used for other purposes; ask your health care provider or pharmacist if you have questions. COMMON BRAND NAME(S): Adriamycin, Adriamycin PFS, Adriamycin RDF, Rubex What should I tell my health care provider before I take this medicine? They need to know if you have any of these conditions:  heart disease  history of low blood counts caused by a medicine  liver disease  recent or ongoing radiation therapy  an unusual or allergic reaction to doxorubicin, other chemotherapy agents,  other medicines, foods, dyes, or preservatives  pregnant or trying to get pregnant  breast-feeding How should I use this medicine? This drug is given as an infusion into a vein. It is administered in a hospital or clinic by a specially trained health care professional. If you have pain, swelling, burning or any unusual feeling around the site of your injection, tell your health care professional right away. Talk to your pediatrician regarding the use of this medicine in children. Special care may be needed. Overdosage: If you think you have taken too much of this medicine contact a poison control center or emergency room at once. NOTE: This medicine is only for you. Do not share this medicine with others. What if I miss a dose? It is important not to miss your dose. Call your doctor or health care professional if you are unable to keep an appointment. What may interact with this medicine? This medicine may interact with the following medications:  6-mercaptopurine  paclitaxel  phenytoin  St. John's Wort  trastuzumab  verapamil This list may not describe all possible interactions. Give your health care provider a list of all the medicines, herbs, non-prescription drugs, or dietary supplements you use. Also tell them if you smoke, drink alcohol, or use illegal drugs. Some items may interact with your medicine. What should I watch for while using this medicine? This drug may make you feel generally unwell. This is not uncommon, as chemotherapy can affect healthy cells as well as cancer cells. Report any side effects. Continue your course of treatment even though you feel ill unless your doctor tells you to stop. There is a maximum   amount of this medicine you should receive throughout your life. The amount depends on the medical condition being treated and your overall health. Your doctor will watch how much of this medicine you receive in your lifetime. Tell your doctor if you have taken  this medicine before. You may need blood work done while you are taking this medicine. Your urine may turn red for a few days after your dose. This is not blood. If your urine is dark or brown, call your doctor. In some cases, you may be given additional medicines to help with side effects. Follow all directions for their use. Call your doctor or health care professional for advice if you get a fever, chills or sore throat, or other symptoms of a cold or flu. Do not treat yourself. This drug decreases your body's ability to fight infections. Try to avoid being around people who are sick. This medicine may increase your risk to bruise or bleed. Call your doctor or health care professional if you notice any unusual bleeding. Talk to your doctor about your risk of cancer. You may be more at risk for certain types of cancers if you take this medicine. Do not become pregnant while taking this medicine or for 6 months after stopping it. Women should inform their doctor if they wish to become pregnant or think they might be pregnant. Men should not father a child while taking this medicine and for 6 months after stopping it. There is a potential for serious side effects to an unborn child. Talk to your health care professional or pharmacist for more information. Do not breast-feed an infant while taking this medicine. This medicine has caused ovarian failure in some women and reduced sperm counts in some men This medicine may interfere with the ability to have a child. Talk with your doctor or health care professional if you are concerned about your fertility. This medicine may cause a decrease in Co-Enzyme Q-10. You should make sure that you get enough Co-Enzyme Q-10 while you are taking this medicine. Discuss the foods you eat and the vitamins you take with your health care professional. What side effects may I notice from receiving this medicine? Side effects that you should report to your doctor or health  care professional as soon as possible:  allergic reactions like skin rash, itching or hives, swelling of the face, lips, or tongue  breathing problems  chest pain  fast or irregular heartbeat  low blood counts - this medicine may decrease the number of white blood cells, red blood cells and platelets. You may be at increased risk for infections and bleeding.  pain, redness, or irritation at site where injected  signs of infection - fever or chills, cough, sore throat, pain or difficulty passing urine  signs of decreased platelets or bleeding - bruising, pinpoint red spots on the skin, black, tarry stools, blood in the urine  swelling of the ankles, feet, hands  tiredness  weakness Side effects that usually do not require medical attention (report to your doctor or health care professional if they continue or are bothersome):  diarrhea  hair loss  mouth sores  nail discoloration or damage  nausea  red colored urine  vomiting This list may not describe all possible side effects. Call your doctor for medical advice about side effects. You may report side effects to FDA at 1-800-FDA-1088. Where should I keep my medicine? This drug is given in a hospital or clinic and will not be stored at   home. NOTE: This sheet is a summary. It may not cover all possible information. If you have questions about this medicine, talk to your doctor, pharmacist, or health care provider.  2020 Elsevier/Gold Standard (2016-10-12 11:01:26)  Cyclophosphamide Injection What is this medicine? CYCLOPHOSPHAMIDE (sye kloe FOSS fa mide) is a chemotherapy drug. It slows the growth of cancer cells. This medicine is used to treat many types of cancer like lymphoma, myeloma, leukemia, breast cancer, and ovarian cancer, to name a few. This medicine may be used for other purposes; ask your health care provider or pharmacist if you have questions. COMMON BRAND NAME(S): Cytoxan, Neosar What should I tell my  health care provider before I take this medicine? They need to know if you have any of these conditions:  heart disease  history of irregular heartbeat  infection  kidney disease  liver disease  low blood counts, like white cells, platelets, or red blood cells  on hemodialysis  recent or ongoing radiation therapy  scarring or thickening of the lungs  trouble passing urine  an unusual or allergic reaction to cyclophosphamide, other medicines, foods, dyes, or preservatives  pregnant or trying to get pregnant  breast-feeding How should I use this medicine? This drug is usually given as an injection into a vein or muscle or by infusion into a vein. It is administered in a hospital or clinic by a specially trained health care professional. Talk to your pediatrician regarding the use of this medicine in children. Special care may be needed. Overdosage: If you think you have taken too much of this medicine contact a poison control center or emergency room at once. NOTE: This medicine is only for you. Do not share this medicine with others. What if I miss a dose? It is important not to miss your dose. Call your doctor or health care professional if you are unable to keep an appointment. What may interact with this medicine?  amphotericin B  azathioprine  certain antivirals for HIV or hepatitis  certain medicines for blood pressure, heart disease, irregular heart beat  certain medicines that treat or prevent blood clots like warfarin  certain other medicines for cancer  cyclosporine  etanercept  indomethacin  medicines that relax muscles for surgery  medicines to increase blood counts  metronidazole This list may not describe all possible interactions. Give your health care provider a list of all the medicines, herbs, non-prescription drugs, or dietary supplements you use. Also tell them if you smoke, drink alcohol, or use illegal drugs. Some items may interact with  your medicine. What should I watch for while using this medicine? Your condition will be monitored carefully while you are receiving this medicine. You may need blood work done while you are taking this medicine. Drink water or other fluids as directed. Urinate often, even at night. Some products may contain alcohol. Ask your health care professional if this medicine contains alcohol. Be sure to tell all health care professionals you are taking this medicine. Certain medicines, like metronidazole and disulfiram, can cause an unpleasant reaction when taken with alcohol. The reaction includes flushing, headache, nausea, vomiting, sweating, and increased thirst. The reaction can last from 30 minutes to several hours. Do not become pregnant while taking this medicine or for 1 year after stopping it. Women should inform their health care professional if they wish to become pregnant or think they might be pregnant. Men should not father a child while taking this medicine and for 4 months after stopping it. There   is potential for serious side effects to an unborn child. Talk to your health care professional for more information. Do not breast-feed an infant while taking this medicine or for 1 week after stopping it. This medicine has caused ovarian failure in some women. This medicine may make it more difficult to get pregnant. Talk to your health care professional if you are concerned about your fertility. This medicine has caused decreased sperm counts in some men. This may make it more difficult to father a child. Talk to your health care professional if you are concerned about your fertility. Call your health care professional for advice if you get a fever, chills, or sore throat, or other symptoms of a cold or flu. Do not treat yourself. This medicine decreases your body's ability to fight infections. Try to avoid being around people who are sick. Avoid taking medicines that contain aspirin, acetaminophen,  ibuprofen, naproxen, or ketoprofen unless instructed by your health care professional. These medicines may hide a fever. Talk to your health care professional about your risk of cancer. You may be more at risk for certain types of cancer if you take this medicine. If you are going to need surgery or other procedure, tell your health care professional that you are using this medicine. Be careful brushing or flossing your teeth or using a toothpick because you may get an infection or bleed more easily. If you have any dental work done, tell your dentist you are receiving this medicine. What side effects may I notice from receiving this medicine? Side effects that you should report to your doctor or health care professional as soon as possible:  allergic reactions like skin rash, itching or hives, swelling of the face, lips, or tongue  breathing problems  nausea, vomiting  signs and symptoms of bleeding such as bloody or black, tarry stools; red or dark brown urine; spitting up blood or brown material that looks like coffee grounds; red spots on the skin; unusual bruising or bleeding from the eyes, gums, or nose  signs and symptoms of heart failure like fast, irregular heartbeat, sudden weight gain; swelling of the ankles, feet, hands  signs and symptoms of infection like fever; chills; cough; sore throat; pain or trouble passing urine  signs and symptoms of kidney injury like trouble passing urine or change in the amount of urine  signs and symptoms of liver injury like dark yellow or brown urine; general ill feeling or flu-like symptoms; light-colored stools; loss of appetite; nausea; right upper belly pain; unusually weak or tired; yellowing of the eyes or skin Side effects that usually do not require medical attention (report to your doctor or health care professional if they continue or are bothersome):  confusion  decreased hearing  diarrhea  facial flushing  hair  loss  headache  loss of appetite  missed menstrual periods  signs and symptoms of low red blood cells or anemia such as unusually weak or tired; feeling faint or lightheaded; falls  skin discoloration This list may not describe all possible side effects. Call your doctor for medical advice about side effects. You may report side effects to FDA at 1-800-FDA-1088. Where should I keep my medicine? This drug is given in a hospital or clinic and will not be stored at home. NOTE: This sheet is a summary. It may not cover all possible information. If you have questions about this medicine, talk to your doctor, pharmacist, or health care provider.  2020 Elsevier/Gold Standard (2018-12-03 09:53:29)  Pembrolizumab   injection What is this medicine? PEMBROLIZUMAB (pem broe liz ue mab) is a monoclonal antibody. It is used to treat certain types of cancer. This medicine may be used for other purposes; ask your health care provider or pharmacist if you have questions. COMMON BRAND NAME(S): Keytruda What should I tell my health care provider before I take this medicine? They need to know if you have any of these conditions:  diabetes  immune system problems  inflammatory bowel disease  liver disease  lung or breathing disease  lupus  received or scheduled to receive an organ transplant or a stem-cell transplant that uses donor stem cells  an unusual or allergic reaction to pembrolizumab, other medicines, foods, dyes, or preservatives  pregnant or trying to get pregnant  breast-feeding How should I use this medicine? This medicine is for infusion into a vein. It is given by a health care professional in a hospital or clinic setting. A special MedGuide will be given to you before each treatment. Be sure to read this information carefully each time. Talk to your pediatrician regarding the use of this medicine in children. While this drug may be prescribed for children as young as 6 months  for selected conditions, precautions do apply. Overdosage: If you think you have taken too much of this medicine contact a poison control center or emergency room at once. NOTE: This medicine is only for you. Do not share this medicine with others. What if I miss a dose? It is important not to miss your dose. Call your doctor or health care professional if you are unable to keep an appointment. What may interact with this medicine? Interactions have not been studied. Give your health care provider a list of all the medicines, herbs, non-prescription drugs, or dietary supplements you use. Also tell them if you smoke, drink alcohol, or use illegal drugs. Some items may interact with your medicine. This list may not describe all possible interactions. Give your health care provider a list of all the medicines, herbs, non-prescription drugs, or dietary supplements you use. Also tell them if you smoke, drink alcohol, or use illegal drugs. Some items may interact with your medicine. What should I watch for while using this medicine? Your condition will be monitored carefully while you are receiving this medicine. You may need blood work done while you are taking this medicine. Do not become pregnant while taking this medicine or for 4 months after stopping it. Women should inform their doctor if they wish to become pregnant or think they might be pregnant. There is a potential for serious side effects to an unborn child. Talk to your health care professional or pharmacist for more information. Do not breast-feed an infant while taking this medicine or for 4 months after the last dose. What side effects may I notice from receiving this medicine? Side effects that you should report to your doctor or health care professional as soon as possible:  allergic reactions like skin rash, itching or hives, swelling of the face, lips, or tongue  bloody or black, tarry  breathing problems  changes in  vision  chest pain  chills  confusion  constipation  cough  diarrhea  dizziness or feeling faint or lightheaded  fast or irregular heartbeat  fever  flushing  joint pain  low blood counts - this medicine may decrease the number of white blood cells, red blood cells and platelets. You may be at increased risk for infections and bleeding.  muscle pain  muscle   weakness  pain, tingling, numbness in the hands or feet  persistent headache  redness, blistering, peeling or loosening of the skin, including inside the mouth  signs and symptoms of high blood sugar such as dizziness; dry mouth; dry skin; fruity breath; nausea; stomach pain; increased hunger or thirst; increased urination  signs and symptoms of kidney injury like trouble passing urine or change in the amount of urine  signs and symptoms of liver injury like dark urine, light-colored stools, loss of appetite, nausea, right upper belly pain, yellowing of the eyes or skin  sweating  swollen lymph nodes  weight loss Side effects that usually do not require medical attention (report to your doctor or health care professional if they continue or are bothersome):  decreased appetite  hair loss  muscle pain  tiredness This list may not describe all possible side effects. Call your doctor for medical advice about side effects. You may report side effects to FDA at 1-800-FDA-1088. Where should I keep my medicine? This drug is given in a hospital or clinic and will not be stored at home. NOTE: This sheet is a summary. It may not cover all possible information. If you have questions about this medicine, talk to your doctor, pharmacist, or health care provider.  2020 Elsevier/Gold Standard (2019-01-04 18:07:58)  

## 2020-03-18 NOTE — Assessment & Plan Note (Signed)
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 Biopsy revealed IDC grade 3, ER 0%, PR 0%, Ki-67 80%, HER-2 -1+  T1c M0 stage IB  Treatment plan: 1.  Bilateral mastectomies 01/09/2020: Right mastectomy:PASH Left mastectomy: Multifocal IDC grade 3 2.2 cm largest, high-grade DCIS, margins negative, 0/4 sentinel lymph nodes, ER negative, PR negative, HER-2 negative, Ki-67 80% 2. I recommended adjuvant chemotherapy with dose dense Adriamycin and Cytoxan followed by Taxol 3.Adjuvant radiation ----------------------------------------------------------------------------------------------------------------------------------- Current treatment: Cycle 1 dose dense Adriamycin and Cytoxan Labs reviewed Chemo education completed, chemo consent obtained Return to clinic in 1 week for toxicity check

## 2020-03-20 ENCOUNTER — Inpatient Hospital Stay: Payer: BC Managed Care – PPO

## 2020-03-20 ENCOUNTER — Other Ambulatory Visit: Payer: Self-pay

## 2020-03-20 VITALS — BP 131/83 | HR 82 | Temp 98.6°F | Resp 18

## 2020-03-20 DIAGNOSIS — Z171 Estrogen receptor negative status [ER-]: Secondary | ICD-10-CM

## 2020-03-20 DIAGNOSIS — C50412 Malignant neoplasm of upper-outer quadrant of left female breast: Secondary | ICD-10-CM

## 2020-03-20 DIAGNOSIS — Z5112 Encounter for antineoplastic immunotherapy: Secondary | ICD-10-CM | POA: Diagnosis not present

## 2020-03-20 MED ORDER — PEGFILGRASTIM-JMDB 6 MG/0.6ML ~~LOC~~ SOSY
6.0000 mg | PREFILLED_SYRINGE | Freq: Once | SUBCUTANEOUS | Status: AC
  Administered 2020-03-20: 6 mg via SUBCUTANEOUS

## 2020-03-20 MED ORDER — PEGFILGRASTIM-JMDB 6 MG/0.6ML ~~LOC~~ SOSY
PREFILLED_SYRINGE | SUBCUTANEOUS | Status: AC
  Filled 2020-03-20: qty 0.6

## 2020-03-20 NOTE — Patient Instructions (Signed)

## 2020-03-24 NOTE — Progress Notes (Signed)
Patient Care Team: Patient, [name] as PCP - General (General Practice) Amanda Kaufmann, RN as Oncology Nurse Navigator Amanda Germany, RN as Oncology Nurse Navigator Amanda Klein, MD as Consulting Physician (General Surgery) Amanda Lose, MD as Consulting Physician (Hematology and Oncology) Amanda Pray, MD as Consulting Physician (Radiation Oncology)  DIAGNOSIS:    ICD-10-CM   1. Malignant neoplasm of upper-outer quadrant of left breast in female, estrogen receptor negative ([place])  C50.412    Z17.1     SUMMARY OF ONCOLOGIC HISTORY: Oncology History  Malignant neoplasm of upper-outer quadrant of left breast in female, estrogen receptor negative ([place])  11/29/2019 Initial Diagnosis   Patient palpated an area of concern in the left breast. Diagnostic mammogram and US showed a 1.8cm mass in the upper outer right breast representing a fibroadenoma, and in the left breast, three adjacent masses at the 2-2:30 position, 1.7cm, 0.8cm, and 1.4cm, with several cysts and no axillary adenopathy. Biopsy showed a benign fibroadenoma in the right breast, and in the left breast, IDC at the 2:30 position, grade 3, HER-2 negative (1+), ER+ 0%, PR+ 0%, Ki67 80%.    12/04/2019 Cancer Staging   Staging form: Breast, AJCC 8th Edition - Clinical: Stage IB (cT1c, cN0, cM0, G3, ER-, PR-, HER2-) - Signed by Amanda Lose, MD on 12/04/2019   12/21/2019 Genetic Testing   Negative genetic testing:  No pathogenic variants detected on the [facility] Multi-Cancer Panel. A variant of uncertain significance (VUS) was detected in the CDK4 gene called c.415C>T (p.Arg139*). The report date is 12/21/2019.  The Multi-Cancer Panel offered by [facility] includes sequencing and/or deletion duplication testing of the following 85 genes: AIP, ALK, APC, ATM, AXIN2,BAP1,  BARD1, BLM, BMPR1A, BRCA1, BRCA2, BRIP1, CASR, CDC73, CDH1, CDK4, CDKN1B, CDKN1C, CDKN2A (p14ARF), CDKN2A (p16INK4a), CEBPA, CHEK2, CTNNA1, DICER1, DIS3L2, EGFR  (c.2369C>T, p.Thr790Met variant only), EPCAM (Deletion/duplication testing only), FH, FLCN, GATA2, GPC3, GREM1 (Promoter region deletion/duplication testing only), HOXB13 (c.251G>A, p.Gly84Glu), HRAS, KIT, MAX, MEN1, MET, MITF (c.952G>A, p.Glu318Lys variant only), MLH1, MSH2, MSH3, MSH6, MUTYH, NBN, NF1, NF2, NTHL1, PALB2, PDGFRA, PHOX2B, PMS2, POLD1, POLE, POT1, PRKAR1A, PTCH1, PTEN, RAD50, RAD51C, RAD51D, RB1, RECQL4, RET, RNF43, RUNX1, SDHAF2, SDHA (sequence changes only), SDHB, SDHC, SDHD, SMAD4, SMARCA4, SMARCB1, SMARCE1, STK11, SUFU, TERC, TERT, TMEM127, TP53, TSC1, TSC2, VHL, WRN and WT1.    01/09/2020 Surgery   Bilateral mastectomies with reconstruction Amanda Burns & Amanda Burns):  Right breast: no evidence of malignancy Left breast: multifocal invasive ductal carcinoma, grade 3, largest 2.2cm, with high grade DCIS, clear margins, 4 left axillary lymph nodes negative for carcinoma.   03/18/2020 -  Chemotherapy    Patient is on Treatment Plan: BREAST AC Q21D / CARBOPLATIN D1 + PACLITAXEL D1,8,15 Q21D        CHIEF COMPLIANT: Cycle 1 Day 8 Adriamycin and Cytoxan pembrolizumab  INTERVAL HISTORY: Amanda Burns is a 40 y.o. with above-mentioned history of left breast cancer who underwent bilateral mastectomies with reconstruction. She is currently on adjuvant chemotherapy with dose dense Adriamycin and Cytoxan, pembrolizumab. She presents to the clinic today for a toxicity check following cycle 1.  After the chemotherapy she felt reasonably well.  Nausea is mild.  She took Zofran regularly after day 3.  It appears that she had a headache that lasted 4 to 5 days.  It started soon after chemo.  She also has some mild neck discomfort.   ALLERGIES:  is allergic to adhesive [tape], benzoyl peroxide, and hydrocodone.  MEDICATIONS:  Current Outpatient Medications  Medication Sig Dispense Refill  . acetaminophen (TYLENOL) 500 MG tablet Take 1,000 mg by mouth every 6 (six) hours as needed for moderate pain  or headache.    . Adapalene 0.3 % gel Apply 1 application topically at bedtime as needed (acne).     Marland Kitchen amphetamine-dextroamphetamine (ADDERALL XR) 20 MG 24 hr capsule Take 20 mg by mouth daily.     Marland Kitchen bismuth subsalicylate (PEPTO BISMOL) 262 MG/15ML suspension Take 30 mLs by mouth every 6 (six) hours as needed for indigestion or diarrhea or loose stools.    Marland Kitchen buPROPion (WELLBUTRIN XL) 150 MG 24 hr tablet Take 150 mg by mouth daily.    Marland Kitchen escitalopram (LEXAPRO) 20 MG tablet Take 20 mg by mouth daily.    . fluticasone (FLONASE) 50 MCG/ACT nasal spray Place 1 spray into both nostrils daily as needed for allergies or rhinitis.    Marland Kitchen ibuprofen (ADVIL) 200 MG tablet Take 400 mg by mouth every 6 (six) hours as needed for headache or moderate pain.    . Ibuprofen-diphenhydrAMINE HCl (IBUPROFEN PM) 200-25 MG CAPS Take 2 tablets by mouth at bedtime as needed (sleep).    Marland Kitchen lidocaine-prilocaine (EMLA) cream Apply to affected area once (Patient taking differently: Apply 1 application topically daily as needed (port access). ) 30 g 3  . LORazepam (ATIVAN) 0.5 MG tablet Take 1 tablet (0.5 mg total) by mouth at bedtime as needed for sleep. 30 tablet 0  . Multiple Vitamins-Minerals (MULTIVITAMIN WITH MINERALS) tablet Take 1 tablet by mouth daily.    Marland Kitchen omeprazole (PRILOSEC) 20 MG capsule Take 20 mg by mouth daily.    . ondansetron (ZOFRAN) 8 MG tablet Take 1 tablet (8 mg total) by mouth 2 (two) times daily as needed. Start on the third day after chemotherapy. 30 tablet 1  . prochlorperazine (COMPAZINE) 10 MG tablet Take 1 tablet (10 mg total) by mouth every 6 (six) hours as needed (Nausea or vomiting). 30 tablet 1   No current facility-administered medications for this visit.   Facility-Administered Medications Ordered in Other Visits  Medication Dose Route Frequency Provider Last Rate Last Admin  . sodium chloride flush (NS) 0.9 % injection 10 mL  10 mL Intravenous PRN Amanda Lose, MD   10 mL at 03/18/20 1033     PHYSICAL EXAMINATION: ECOG PERFORMANCE STATUS: 1 - Symptomatic but completely ambulatory  Vitals:   03/25/20 1140  BP: 113/65  Pulse: 98  Resp: 18  Temp: 98.1 F (36.7 C)  SpO2: 100%   Filed Weights   03/25/20 1140  Weight: 176 lb 6.4 oz (80 kg)    LABORATORY DATA:  I have reviewed the data as listed CMP Latest Ref Rng & Units 03/25/2020 03/18/2020 01/03/2020  Glucose 70 - 99 mg/dL 120(H) 123(H) 101(H)  BUN 6 - 20 mg/dL $Remove'9 10 9  'CkgxKcb$ Creatinine 0.44 - 1.00 mg/dL 0.71 0.85 0.78  Sodium 135 - 145 mmol/L 136 137 136  Potassium 3.5 - 5.1 mmol/L 3.8 4.0 3.6  Chloride 98 - 111 mmol/L 104 105 102  CO2 22 - 32 mmol/L $RemoveB'25 24 24  'dFqhKklZ$ Calcium 8.9 - 10.3 mg/dL 9.0 9.1 9.1  Total Protein 6.5 - 8.1 g/dL 7.1 7.4 7.7  Total Bilirubin 0.3 - 1.2 mg/dL 0.3 0.3 0.6  Alkaline Phos 38 - 126 U/L 109 143(H) 89  AST 15 - 41 U/L 11(L) 21 26  ALT 0 - 44 U/L 12 33 40    Lab Results  Component Value Date   WBC 1.6 (  L) 03/25/2020   HGB 9.0 (L) 03/25/2020   HCT 28.1 (L) 03/25/2020   MCV 73.4 (L) 03/25/2020   PLT 260 03/25/2020   NEUTROABS 0.7 (L) 03/25/2020    ASSESSMENT & PLAN:  Malignant neoplasm of upper-outer quadrant of left breast in female, estrogen receptor negative ([facility]) Palpable left breast mass: 1.8 cm UOQ fibroadenoma in the right breast, left breast 3 masses 2 to 2:30 position: 1.7, 1.8, 1.4 cm. Biopsy revealed IDC grade 3, ER 0%, PR 0%, Ki-67 80%, HER-2 -1+  T1c M0 stage IB  Treatment plan: 1.Bilateral mastectomies 01/09/2020: Right mastectomy:PASH Left mastectomy: Multifocal IDC grade 3 2.2 cm largest, high-grade DCIS, margins negative, 0/4 sentinel lymph nodes, ER negative, PR negative, HER-2 negative, Ki-67 80% 2.I recommended adjuvant chemotherapy with dose dense Adriamycin andCytoxan pembrolizumab followed by Taxol, carboplatin and pembrolizumab 3.Adjuvant  radiation ----------------------------------------------------------------------------------------------------------------------------------- Current treatment: Cycle 1 day 8 dose dense Adriamycin and Cytoxan and pembrolizumab every 3 weeks Labs reviewed  Chemo toxicities: 1.  Mild nausea 2. chest congestion: Unclear etiology examination does not reveal any abnormalities.  She is currently on omeprazole and she is also on antibiotic for sinus infection that is not COVID-related. 3. mild fatigue 4.  Mild hyperthyroidism TSH 0.25 5.  Anemia: Patient was anemic even prior to starting cycle 1 chemo.  Iron studies reveal a ferritin of 122 and iron saturation of 23%, no role of IV iron therapy.  Thyroid function will need to be monitored.  With pembrolizumab initially patients may get hyperthyroid and later become hypothyroid.  Return to clinic in 2 weeks for cycle 2.    No orders of the defined types were placed in this encounter.  The patient has a good understanding of the overall plan. she agrees with it. she will call with any problems that may develop before the next visit here.  Total time spent: 30 mins including face to face time and time spent for planning, charting and coordination of care  Amanda Lose, MD 03/25/2020  I, Cloyde Reams Dorshimer, am acting as scribe for Dr. Nicholas Burns.  I have reviewed the above documentation for accuracy and completeness, and I agree with the above.

## 2020-03-25 ENCOUNTER — Inpatient Hospital Stay: Payer: BC Managed Care – PPO

## 2020-03-25 ENCOUNTER — Encounter: Payer: Self-pay | Admitting: Hematology and Oncology

## 2020-03-25 ENCOUNTER — Encounter: Payer: Self-pay | Admitting: *Deleted

## 2020-03-25 ENCOUNTER — Other Ambulatory Visit: Payer: Self-pay

## 2020-03-25 ENCOUNTER — Inpatient Hospital Stay: Payer: BC Managed Care – PPO | Admitting: Hematology and Oncology

## 2020-03-25 DIAGNOSIS — Z171 Estrogen receptor negative status [ER-]: Secondary | ICD-10-CM

## 2020-03-25 DIAGNOSIS — Z95828 Presence of other vascular implants and grafts: Secondary | ICD-10-CM

## 2020-03-25 DIAGNOSIS — C50412 Malignant neoplasm of upper-outer quadrant of left female breast: Secondary | ICD-10-CM | POA: Diagnosis not present

## 2020-03-25 DIAGNOSIS — Z5112 Encounter for antineoplastic immunotherapy: Secondary | ICD-10-CM | POA: Diagnosis not present

## 2020-03-25 LAB — CBC WITH DIFFERENTIAL (CANCER CENTER ONLY)
Abs Immature Granulocytes: 0.01 10*3/uL (ref 0.00–0.07)
Basophils Absolute: 0 10*3/uL (ref 0.0–0.1)
Basophils Relative: 1 %
Eosinophils Absolute: 0.1 10*3/uL (ref 0.0–0.5)
Eosinophils Relative: 7 %
HCT: 28.1 % — ABNORMAL LOW (ref 36.0–46.0)
Hemoglobin: 9 g/dL — ABNORMAL LOW (ref 12.0–15.0)
Immature Granulocytes: 1 %
Lymphocytes Relative: 43 %
Lymphs Abs: 0.7 10*3/uL (ref 0.7–4.0)
MCH: 23.5 pg — ABNORMAL LOW (ref 26.0–34.0)
MCHC: 32 g/dL (ref 30.0–36.0)
MCV: 73.4 fL — ABNORMAL LOW (ref 80.0–100.0)
Monocytes Absolute: 0.1 10*3/uL (ref 0.1–1.0)
Monocytes Relative: 5 %
Neutro Abs: 0.7 10*3/uL — ABNORMAL LOW (ref 1.7–7.7)
Neutrophils Relative %: 43 %
Platelet Count: 260 10*3/uL (ref 150–400)
RBC: 3.83 MIL/uL — ABNORMAL LOW (ref 3.87–5.11)
RDW: 14.2 % (ref 11.5–15.5)
WBC Count: 1.6 10*3/uL — ABNORMAL LOW (ref 4.0–10.5)
nRBC: 0 % (ref 0.0–0.2)

## 2020-03-25 LAB — CMP (CANCER CENTER ONLY)
ALT: 12 U/L (ref 0–44)
AST: 11 U/L — ABNORMAL LOW (ref 15–41)
Albumin: 3.6 g/dL (ref 3.5–5.0)
Alkaline Phosphatase: 109 U/L (ref 38–126)
Anion gap: 7 (ref 5–15)
BUN: 9 mg/dL (ref 6–20)
CO2: 25 mmol/L (ref 22–32)
Calcium: 9 mg/dL (ref 8.9–10.3)
Chloride: 104 mmol/L (ref 98–111)
Creatinine: 0.71 mg/dL (ref 0.44–1.00)
GFR, Estimated: >60 mL/min (ref >60)
Glucose, Bld: 120 mg/dL — ABNORMAL HIGH (ref 70–99)
Potassium: 3.8 mmol/L (ref 3.5–5.1)
Sodium: 136 mmol/L (ref 135–145)
Total Bilirubin: 0.3 mg/dL (ref 0.3–1.2)
Total Protein: 7.1 g/dL (ref 6.5–8.1)

## 2020-03-25 LAB — FERRITIN: Ferritin: 122 ng/mL (ref 11–307)

## 2020-03-25 LAB — IRON AND TIBC
Iron: 114 ug/dL (ref 41–142)
Saturation Ratios: 32 % (ref 21–57)
TIBC: 355 ug/dL (ref 236–444)
UIBC: 241 ug/dL (ref 120–384)

## 2020-03-25 LAB — TSH: TSH: 0.275 u[IU]/mL — ABNORMAL LOW (ref 0.308–3.960)

## 2020-03-25 MED ORDER — SODIUM CHLORIDE 0.9% FLUSH
10.0000 mL | Freq: Once | INTRAVENOUS | Status: AC
  Administered 2020-03-25: 10 mL via INTRAVENOUS
  Filled 2020-03-25: qty 10

## 2020-03-25 MED ORDER — HEPARIN SOD (PORK) LOCK FLUSH 100 UNIT/ML IV SOLN
500.0000 [IU] | Freq: Once | INTRAVENOUS | Status: AC
  Administered 2020-03-25: 500 [IU] via INTRAVENOUS
  Filled 2020-03-25: qty 5

## 2020-03-25 NOTE — Progress Notes (Signed)
Met with patient at registration to introduce myself as Arboriculturist and to offer available resources.  Discussed one-time $1000 Radio broadcast assistant to assist with personal expenses while going through treatment. Advised what is needed to apply.  Also discussed available copay assistance for specific treatment drugs she will be receiving. Gave her application for [employer] for [facility] for her to complete and return to me.  Advised for [employer], I can apply on her behalf if she consents. She said that would be great.  She has my card for any additional financial questions or concerns.

## 2020-03-25 NOTE — Progress Notes (Signed)
Gave phyisician form for [employer] for [place] assistance to [place] for doctor to complete and return to me.

## 2020-03-25 NOTE — Assessment & Plan Note (Signed)
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 Biopsy revealed IDC grade 3, ER 0%, PR 0%, Ki-67 80%, HER-2 -1+  T1c M0 stage IB  Treatment plan: 1.Bilateral mastectomies 01/09/2020: Right mastectomy:PASH Left mastectomy: Multifocal IDC grade 3 2.2 cm largest, high-grade DCIS, margins negative, 0/4 sentinel lymph nodes, ER negative, PR negative, HER-2 negative, Ki-67 80% 2.I recommended adjuvant chemotherapy with dose dense Adriamycin andCytoxan followed by Taxol 3.Adjuvant radiation ----------------------------------------------------------------------------------------------------------------------------------- Current treatment: Cycle 1 day 8 dose dense Adriamycin and Cytoxan Labs reviewed  Chemo toxicities:  Return to clinic in 1 week for cycle 2

## 2020-03-25 NOTE — Progress Notes (Signed)
Enrolled patient in copay assistance for [employer] through [employer].

## 2020-03-25 NOTE — Patient Instructions (Signed)

## 2020-03-26 ENCOUNTER — Encounter: Payer: Self-pay | Admitting: Hematology and Oncology

## 2020-03-26 NOTE — Progress Notes (Signed)
Received signed physician portion of [employer] application for [place] assistance.  Holding until I receive patient portion and then will fax to [employer] for processing.  Patient has my card to return application to and for any additional financial questions or concerns.

## 2020-03-27 ENCOUNTER — Encounter: Payer: Self-pay | Admitting: Hematology and Oncology

## 2020-03-27 NOTE — Progress Notes (Signed)
Received approval letter from [employer].  Patient approved 03/26/20 - 03/25/21 reducing her copay responsibility to $0 up to $10,000 after insurance pays per 12 month enrollment period.   Copy of approval letter left for Len[place] for billing/copay submissions.

## 2020-04-01 ENCOUNTER — Ambulatory Visit: Payer: BC Managed Care – PPO

## 2020-04-01 ENCOUNTER — Other Ambulatory Visit: Payer: BC Managed Care – PPO

## 2020-04-01 ENCOUNTER — Ambulatory Visit: Payer: BC Managed Care – PPO | Admitting: Hematology and Oncology

## 2020-04-02 ENCOUNTER — Telehealth: Payer: Self-pay | Admitting: Hematology and Oncology

## 2020-04-02 NOTE — Telephone Encounter (Signed)
Scheduled per 1/12 los. Called and spoke with pt, pt has confirmed 1/27 appts on mychart

## 2020-04-03 ENCOUNTER — Ambulatory Visit: Payer: BC Managed Care – PPO

## 2020-04-08 NOTE — Progress Notes (Signed)
Patient Care Team: Patient, [name] as PCP - General (General Practice) Amanda Kaufmann, RN as Oncology Nurse Navigator Amanda Germany, RN as Oncology Nurse Navigator Amanda Klein, MD as Consulting Physician (General Surgery) Amanda Lose, MD as Consulting Physician (Hematology and Oncology) Amanda Pray, MD as Consulting Physician (Radiation Oncology)  DIAGNOSIS:    ICD-10-CM   1. Malignant neoplasm of upper-outer quadrant of left breast in female, estrogen receptor negative ([place])  C50.412    Z17.1     SUMMARY OF ONCOLOGIC HISTORY: Oncology History  Malignant neoplasm of upper-outer quadrant of left breast in female, estrogen receptor negative ([place])  11/29/2019 Initial Diagnosis   Patient palpated an area of concern in the left breast. Diagnostic mammogram and US showed a 1.8cm mass in the upper outer right breast representing a fibroadenoma, and in the left breast, three adjacent masses at the 2-2:30 position, 1.7cm, 0.8cm, and 1.4cm, with several cysts and no axillary adenopathy. Biopsy showed a benign fibroadenoma in the right breast, and in the left breast, IDC at the 2:30 position, grade 3, HER-2 negative (1+), ER+ 0%, PR+ 0%, Ki67 80%.    12/04/2019 Cancer Staging   Staging form: Breast, AJCC 8th Edition - Clinical: Stage IB (cT1c, cN0, cM0, G3, ER-, PR-, HER2-) - Signed by Amanda Lose, MD on 12/04/2019   12/21/2019 Genetic Testing   Negative genetic testing:  No pathogenic variants detected on the [facility] Multi-Cancer Panel. A variant of uncertain significance (VUS) was detected in the CDK4 gene called c.415C>T (p.Arg139*). The report date is 12/21/2019.  The Multi-Cancer Panel offered by [facility] includes sequencing and/or deletion duplication testing of the following 85 genes: AIP, ALK, APC, ATM, AXIN2,BAP1,  BARD1, BLM, BMPR1A, BRCA1, BRCA2, BRIP1, CASR, CDC73, CDH1, CDK4, CDKN1B, CDKN1C, CDKN2A (p14ARF), CDKN2A (p16INK4a), CEBPA, CHEK2, CTNNA1, DICER1, DIS3L2, EGFR  (c.2369C>T, p.Thr790Met variant only), EPCAM (Deletion/duplication testing only), FH, FLCN, GATA2, GPC3, GREM1 (Promoter region deletion/duplication testing only), HOXB13 (c.251G>A, p.Gly84Glu), HRAS, KIT, MAX, MEN1, MET, MITF (c.952G>A, p.Glu318Lys variant only), MLH1, MSH2, MSH3, MSH6, MUTYH, NBN, NF1, NF2, NTHL1, PALB2, PDGFRA, PHOX2B, PMS2, POLD1, POLE, POT1, PRKAR1A, PTCH1, PTEN, RAD50, RAD51C, RAD51D, RB1, RECQL4, RET, RNF43, RUNX1, SDHAF2, SDHA (sequence changes only), SDHB, SDHC, SDHD, SMAD4, SMARCA4, SMARCB1, SMARCE1, STK11, SUFU, TERC, TERT, TMEM127, TP53, TSC1, TSC2, VHL, WRN and WT1.    01/09/2020 Surgery   Bilateral mastectomies with reconstruction Amanda Burns & Amanda Burns):  Right breast: no evidence of malignancy Left breast: multifocal invasive ductal carcinoma, grade 3, largest 2.2cm, with high grade DCIS, clear margins, 4 left axillary lymph nodes negative for carcinoma.   03/18/2020 -  Chemotherapy    Patient is on Treatment Plan: BREAST AC Q21D / CARBOPLATIN D1 + PACLITAXEL D1,8,15 Q21D        CHIEF COMPLIANT: Cycle 2 Day 1 Adriamycin and Cytoxan pembrolizumab  INTERVAL HISTORY: Amanda Burns is a 40 y.o. with above-mentioned history of left breast cancerwhounderwent bilateral mastectomies with reconstruction. She is currently on adjuvant chemotherapy with dose dense Adriamycin and Cytoxan, pembrolizumab.She presents to the clinic today for a toxicity check and cycle 2.    ALLERGIES:  is allergic to adhesive [tape], benzoyl peroxide, and hydrocodone.  MEDICATIONS:  Current Outpatient Medications  Medication Sig Dispense Refill  . acetaminophen (TYLENOL) 500 MG tablet Take 1,000 mg by mouth every 6 (six) hours as needed for moderate pain or headache.    . Adapalene 0.3 % gel Apply 1 application topically at bedtime as needed (acne).     Marland Kitchen amphetamine-dextroamphetamine (  ADDERALL XR) 20 MG 24 hr capsule Take 20 mg by mouth daily.     Marland Kitchen bismuth subsalicylate (PEPTO BISMOL) 262  MG/15ML suspension Take 30 mLs by mouth every 6 (six) hours as needed for indigestion or diarrhea or loose stools.    Marland Kitchen buPROPion (WELLBUTRIN XL) 150 MG 24 hr tablet Take 150 mg by mouth daily.    Marland Kitchen escitalopram (LEXAPRO) 20 MG tablet Take 20 mg by mouth daily.    . fluticasone (FLONASE) 50 MCG/ACT nasal spray Place 1 spray into both nostrils daily as needed for allergies or rhinitis.    Marland Kitchen ibuprofen (ADVIL) 200 MG tablet Take 400 mg by mouth every 6 (six) hours as needed for headache or moderate pain.    . Ibuprofen-diphenhydrAMINE HCl (IBUPROFEN PM) 200-25 MG CAPS Take 2 tablets by mouth at bedtime as needed (sleep).    Marland Kitchen lidocaine-prilocaine (EMLA) cream Apply to affected area once (Patient taking differently: Apply 1 application topically daily as needed (port access). ) 30 g 3  . LORazepam (ATIVAN) 0.5 MG tablet Take 1 tablet (0.5 mg total) by mouth at bedtime as needed for sleep. 30 tablet 0  . Multiple Vitamins-Minerals (MULTIVITAMIN WITH MINERALS) tablet Take 1 tablet by mouth daily.    Marland Kitchen omeprazole (PRILOSEC) 20 MG capsule Take 20 mg by mouth daily.    . ondansetron (ZOFRAN) 8 MG tablet Take 1 tablet (8 mg total) by mouth 2 (two) times daily as needed. Start on the third day after chemotherapy. 30 tablet 1  . prochlorperazine (COMPAZINE) 10 MG tablet Take 1 tablet (10 mg total) by mouth every 6 (six) hours as needed (Nausea or vomiting). 30 tablet 1   No current facility-administered medications for this visit.   Facility-Administered Medications Ordered in Other Visits  Medication Dose Route Frequency Provider Last Rate Last Admin  . sodium chloride flush (NS) 0.9 % injection 10 mL  10 mL Intravenous PRN Amanda Lose, MD   10 mL at 03/18/20 1033    PHYSICAL EXAMINATION: ECOG PERFORMANCE STATUS: 1 - Symptomatic but completely ambulatory  Vitals:   04/09/20 1213  BP: 116/72  Pulse: 95  Resp: 20  Temp: 98.1 F (36.7 C)  SpO2: 100%   Filed Weights   04/09/20 1213  Weight: 176  lb 14.4 oz (80.2 kg)     LABORATORY DATA:  I have reviewed the data as listed CMP Latest Ref Rng & Units 04/09/2020 03/25/2020 03/18/2020  Glucose 70 - 99 mg/dL 124(H) 120(H) 123(H)  BUN 6 - 20 mg/dL $Remove'9 9 10  'uGyjgVQ$ Creatinine 0.44 - 1.00 mg/dL 0.79 0.71 0.85  Sodium 135 - 145 mmol/L 137 136 137  Potassium 3.5 - 5.1 mmol/L 3.9 3.8 4.0  Chloride 98 - 111 mmol/L 105 104 105  CO2 22 - 32 mmol/L $RemoveB'26 25 24  'lFabNvUy$ Calcium 8.9 - 10.3 mg/dL 8.8(L) 9.0 9.1  Total Protein 6.5 - 8.1 g/dL 7.0 7.1 7.4  Total Bilirubin 0.3 - 1.2 mg/dL <0.2(L) 0.3 0.3  Alkaline Phos 38 - 126 U/L 137(H) 109 143(H)  AST 15 - 41 U/L 32 11(L) 21  ALT 0 - 44 U/L 37 12 33    Lab Results  Component Value Date   WBC 8.5 04/09/2020   HGB 9.6 (L) 04/09/2020   HCT 30.7 (L) 04/09/2020   MCV 75.6 (L) 04/09/2020   PLT 559 (H) 04/09/2020   NEUTROABS 6.5 04/09/2020    ASSESSMENT & PLAN:  Malignant neoplasm of upper-outer quadrant of left breast in female, estrogen receptor  negative ([place]) Palpable left breast mass: 1.8 cm UOQ fibroadenoma in the right breast, left breast 3 masses 2 to 2:30 position: 1.7, 1.8, 1.4 cm. Biopsy revealed IDC grade 3, ER 0%, PR 0%, Ki-67 80%, HER-2 -1+  T1c M0 stage IB  Treatment plan: 1.Bilateral mastectomies 01/09/2020: Right mastectomy:PASH Left mastectomy: Multifocal IDC grade 3 2.2 cm largest, high-grade DCIS, margins negative, 0/4 sentinel lymph nodes, ER negative, PR negative, HER-2 negative, Ki-67 80% 2.I recommended adjuvant chemotherapy with dose dense Adriamycin andCytoxan pembrolizumab followed by Taxol, carboplatin and pembrolizumab 3.Adjuvant radiation ----------------------------------------------------------------------------------------------------------------------------------- Current treatment: Cycle 2 Adriamycin and Cytoxan and pembrolizumab every 3 weeks Labs reviewed  Chemo toxicities: 1.  Mild nausea 2. chest congestion: Unclear etiology examination does not reveal any  abnormalities.  She is currently on omeprazole and she is also on antibiotic for sinus infection that is not COVID-related. 3. mild fatigue 4.  Mild hyperthyroidism TSH 0.25 5.  Anemia: Patient was anemic even prior to starting cycle 1 chemo.  Iron studies reveal a ferritin of 122 and iron saturation of 23%, no role of IV iron therapy.  Thyroid function will need to be monitored.  With pembrolizumab initially patients may get hyperthyroid and later become hypothyroid.  Return to clinic in 3 weeks for cycle 3.    No orders of the defined types were placed in this encounter.  The patient has a good understanding of the overall plan. she agrees with it. she will call with any problems that may develop before the next visit here.  Total time spent: 30 mins including face to face time and time spent for planning, charting and coordination of care  Amanda Lose, MD 04/09/2020  I, Cloyde Reams Dorshimer, am acting as scribe for Dr. Nicholas Burns.  I have reviewed the above documentation for accuracy and completeness, and I agree with the above.

## 2020-04-08 NOTE — Assessment & Plan Note (Signed)
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 Biopsy revealed IDC grade 3, ER 0%, PR 0%, Ki-67 80%, HER-2 -1+  T1c M0 stage IB  Treatment plan: 1.Bilateral mastectomies 01/09/2020: Right mastectomy:PASH Left mastectomy: Multifocal IDC grade 3 2.2 cm largest, high-grade DCIS, margins negative, 0/4 sentinel lymph nodes, ER negative, PR negative, HER-2 negative, Ki-67 80% 2.I recommended adjuvant chemotherapy with dose dense Adriamycin andCytoxan pembrolizumab followed by Taxol, carboplatin and pembrolizumab 3.Adjuvant radiation ----------------------------------------------------------------------------------------------------------------------------------- Current treatment: Cycle 2 Adriamycin and Cytoxan and pembrolizumab every 3 weeks Labs reviewed  Chemo toxicities: 1.  Mild nausea 2. chest congestion: Unclear etiology examination does not reveal any abnormalities.  She is currently on omeprazole and she is also on antibiotic for sinus infection that is not COVID-related. 3. mild fatigue 4.  Mild hyperthyroidism TSH 0.25 5.  Anemia: Patient was anemic even prior to starting cycle 1 chemo.  Iron studies reveal a ferritin of 122 and iron saturation of 23%, no role of IV iron therapy.  Thyroid function will need to be monitored.  With pembrolizumab initially patients may get hyperthyroid and later become hypothyroid.  Return to clinic in 3 weeks for cycle 3.

## 2020-04-09 ENCOUNTER — Inpatient Hospital Stay: Payer: BC Managed Care – PPO

## 2020-04-09 ENCOUNTER — Inpatient Hospital Stay: Payer: BC Managed Care – PPO | Admitting: Hematology and Oncology

## 2020-04-09 ENCOUNTER — Other Ambulatory Visit: Payer: Self-pay

## 2020-04-09 ENCOUNTER — Other Ambulatory Visit: Payer: Self-pay | Admitting: *Deleted

## 2020-04-09 DIAGNOSIS — C50412 Malignant neoplasm of upper-outer quadrant of left female breast: Secondary | ICD-10-CM | POA: Diagnosis not present

## 2020-04-09 DIAGNOSIS — Z5181 Encounter for therapeutic drug level monitoring: Secondary | ICD-10-CM

## 2020-04-09 DIAGNOSIS — Z79899 Other long term (current) drug therapy: Secondary | ICD-10-CM

## 2020-04-09 DIAGNOSIS — Z171 Estrogen receptor negative status [ER-]: Secondary | ICD-10-CM

## 2020-04-09 DIAGNOSIS — Z5112 Encounter for antineoplastic immunotherapy: Secondary | ICD-10-CM | POA: Diagnosis not present

## 2020-04-09 LAB — CBC WITH DIFFERENTIAL (CANCER CENTER ONLY)
Abs Immature Granulocytes: 0.06 10*3/uL (ref 0.00–0.07)
Basophils Absolute: 0.2 10*3/uL — ABNORMAL HIGH (ref 0.0–0.1)
Basophils Relative: 2 %
Eosinophils Absolute: 0 10*3/uL (ref 0.0–0.5)
Eosinophils Relative: 1 %
HCT: 30.7 % — ABNORMAL LOW (ref 36.0–46.0)
Hemoglobin: 9.6 g/dL — ABNORMAL LOW (ref 12.0–15.0)
Immature Granulocytes: 1 %
Lymphocytes Relative: 12 %
Lymphs Abs: 1.1 10*3/uL (ref 0.7–4.0)
MCH: 23.6 pg — ABNORMAL LOW (ref 26.0–34.0)
MCHC: 31.3 g/dL (ref 30.0–36.0)
MCV: 75.6 fL — ABNORMAL LOW (ref 80.0–100.0)
Monocytes Absolute: 0.7 10*3/uL (ref 0.1–1.0)
Monocytes Relative: 8 %
Neutro Abs: 6.5 10*3/uL (ref 1.7–7.7)
Neutrophils Relative %: 76 %
Platelet Count: 559 10*3/uL — ABNORMAL HIGH (ref 150–400)
RBC: 4.06 MIL/uL (ref 3.87–5.11)
RDW: 16 % — ABNORMAL HIGH (ref 11.5–15.5)
WBC Count: 8.5 10*3/uL (ref 4.0–10.5)
nRBC: 0 % (ref 0.0–0.2)

## 2020-04-09 LAB — CMP (CANCER CENTER ONLY)
ALT: 37 U/L (ref 0–44)
AST: 32 U/L (ref 15–41)
Albumin: 3.7 g/dL (ref 3.5–5.0)
Alkaline Phosphatase: 137 U/L — ABNORMAL HIGH (ref 38–126)
Anion gap: 6 (ref 5–15)
BUN: 9 mg/dL (ref 6–20)
CO2: 26 mmol/L (ref 22–32)
Calcium: 8.8 mg/dL — ABNORMAL LOW (ref 8.9–10.3)
Chloride: 105 mmol/L (ref 98–111)
Creatinine: 0.79 mg/dL (ref 0.44–1.00)
GFR, Estimated: >60 mL/min (ref >60)
Glucose, Bld: 124 mg/dL — ABNORMAL HIGH (ref 70–99)
Potassium: 3.9 mmol/L (ref 3.5–5.1)
Sodium: 137 mmol/L (ref 135–145)
Total Bilirubin: <0.2 mg/dL — ABNORMAL LOW (ref 0.3–1.2)
Total Protein: 7 g/dL (ref 6.5–8.1)

## 2020-04-09 LAB — TSH: TSH: 0.579 u[IU]/mL (ref 0.308–3.960)

## 2020-04-09 MED ORDER — SODIUM CHLORIDE 0.9 % IV SOLN
Freq: Once | INTRAVENOUS | Status: AC
  Filled 2020-04-09: qty 250

## 2020-04-09 MED ORDER — SODIUM CHLORIDE 0.9 % IV SOLN
600.0000 mg/m2 | Freq: Once | INTRAVENOUS | Status: AC
  Administered 2020-04-09: 1120 mg via INTRAVENOUS
  Filled 2020-04-09: qty 56

## 2020-04-09 MED ORDER — SODIUM CHLORIDE 0.9 % IV SOLN
10.0000 mg | Freq: Once | INTRAVENOUS | Status: AC
  Administered 2020-04-09: 10 mg via INTRAVENOUS
  Filled 2020-04-09: qty 10

## 2020-04-09 MED ORDER — PALONOSETRON HCL INJECTION 0.25 MG/5ML
INTRAVENOUS | Status: AC
  Filled 2020-04-09: qty 5

## 2020-04-09 MED ORDER — PALONOSETRON HCL INJECTION 0.25 MG/5ML
0.2500 mg | Freq: Once | INTRAVENOUS | Status: AC
  Administered 2020-04-09: 0.25 mg via INTRAVENOUS

## 2020-04-09 MED ORDER — DOXORUBICIN HCL CHEMO IV INJECTION 2 MG/ML
60.0000 mg/m2 | Freq: Once | INTRAVENOUS | Status: AC
  Administered 2020-04-09: 112 mg via INTRAVENOUS
  Filled 2020-04-09: qty 56

## 2020-04-09 MED ORDER — SODIUM CHLORIDE 0.9 % IV SOLN
200.0000 mg | Freq: Once | INTRAVENOUS | Status: AC
  Administered 2020-04-09: 200 mg via INTRAVENOUS
  Filled 2020-04-09: qty 8

## 2020-04-09 MED ORDER — SODIUM CHLORIDE 0.9 % IV SOLN
150.0000 mg | Freq: Once | INTRAVENOUS | Status: AC
  Administered 2020-04-09: 150 mg via INTRAVENOUS
  Filled 2020-04-09: qty 150

## 2020-04-09 MED ORDER — SODIUM CHLORIDE 0.9% FLUSH
10.0000 mL | INTRAVENOUS | Status: DC | PRN
  Administered 2020-04-09: 10 mL
  Filled 2020-04-09: qty 10

## 2020-04-09 MED ORDER — HEPARIN SOD (PORK) LOCK FLUSH 100 UNIT/ML IV SOLN
500.0000 [IU] | Freq: Once | INTRAVENOUS | Status: AC | PRN
  Administered 2020-04-09: 500 [IU]
  Filled 2020-04-09: qty 5

## 2020-04-09 NOTE — Progress Notes (Signed)
Per [name], repeat echo should be done after next cycle.

## 2020-04-09 NOTE — Patient Instructions (Signed)

## 2020-04-09 NOTE — Progress Notes (Signed)
Per MD pt needing to receive 3 doses of adriamycin prior to receiving repeat echocardiogram.  Third dose of adriamycin is scheduled for 04/29/20.  F/U echo scheduled for 05/08/20.  Pt notified and verbalized understanding.

## 2020-04-11 ENCOUNTER — Inpatient Hospital Stay: Payer: BC Managed Care – PPO

## 2020-04-11 ENCOUNTER — Other Ambulatory Visit: Payer: Self-pay

## 2020-04-11 VITALS — BP 128/79 | HR 87 | Temp 98.6°F | Resp 17

## 2020-04-11 DIAGNOSIS — Z5112 Encounter for antineoplastic immunotherapy: Secondary | ICD-10-CM | POA: Diagnosis not present

## 2020-04-11 DIAGNOSIS — C50412 Malignant neoplasm of upper-outer quadrant of left female breast: Secondary | ICD-10-CM

## 2020-04-11 DIAGNOSIS — Z171 Estrogen receptor negative status [ER-]: Secondary | ICD-10-CM

## 2020-04-11 MED ORDER — PEGFILGRASTIM-JMDB 6 MG/0.6ML ~~LOC~~ SOSY
6.0000 mg | PREFILLED_SYRINGE | Freq: Once | SUBCUTANEOUS | Status: AC
  Administered 2020-04-11: 6 mg via SUBCUTANEOUS

## 2020-04-11 NOTE — Patient Instructions (Signed)
Pegfilgrastim injection What is this medicine? PEGFILGRASTIM (PEG fil gra stim) is a long-acting granulocyte colony-stimulating factor that stimulates the growth of neutrophils, a type of white blood cell important in the body's fight against infection. It is used to reduce the incidence of fever and infection in patients with certain types of cancer who are receiving chemotherapy that affects the bone marrow, and to increase survival after being exposed to high doses of radiation. This medicine may be used for other purposes; ask your health care provider or pharmacist if you have questions. COMMON BRAND NAME(S): Fulphila, Neulasta, Nyvepria, UDENYCA, Ziextenzo What should I tell my health care provider before I take this medicine? They need to know if you have any of these conditions:  kidney disease  latex allergy  ongoing radiation therapy  sickle cell disease  skin reactions to acrylic adhesives (On-Body Injector only)  an unusual or allergic reaction to pegfilgrastim, filgrastim, other medicines, foods, dyes, or preservatives  pregnant or trying to get pregnant  breast-feeding How should I use this medicine? This medicine is for injection under the skin. If you get this medicine at home, you will be taught how to prepare and give the pre-filled syringe or how to use the On-body Injector. Refer to the patient Instructions for Use for detailed instructions. Use exactly as directed. Tell your healthcare provider immediately if you suspect that the On-body Injector may not have performed as intended or if you suspect the use of the On-body Injector resulted in a missed or partial dose. It is important that you put your used needles and syringes in a special sharps container. Do not put them in a trash can. If you do not have a sharps container, call your pharmacist or healthcare provider to get one. Talk to your pediatrician regarding the use of this medicine in children. While this drug  may be prescribed for selected conditions, precautions do apply. Overdosage: If you think you have taken too much of this medicine contact a poison control center or emergency room at once. NOTE: This medicine is only for you. Do not share this medicine with others. What if I miss a dose? It is important not to miss your dose. Call your doctor or health care professional if you miss your dose. If you miss a dose due to an On-body Injector failure or leakage, a new dose should be administered as soon as possible using a single prefilled syringe for manual use. What may interact with this medicine? Interactions have not been studied. This list may not describe all possible interactions. Give your health care provider a list of all the medicines, herbs, non-prescription drugs, or dietary supplements you use. Also tell them if you smoke, drink alcohol, or use illegal drugs. Some items may interact with your medicine. What should I watch for while using this medicine? Your condition will be monitored carefully while you are receiving this medicine. You may need blood work done while you are taking this medicine. Talk to your health care provider about your risk of cancer. You may be more at risk for certain types of cancer if you take this medicine. If you are going to need a MRI, CT scan, or other procedure, tell your doctor that you are using this medicine (On-Body Injector only). What side effects may I notice from receiving this medicine? Side effects that you should report to your doctor or health care professional as soon as possible:  allergic reactions (skin rash, itching or hives, swelling of   the face, lips, or tongue)  back pain  dizziness  fever  pain, redness, or irritation at site where injected  pinpoint red spots on the skin  red or dark-brown urine  shortness of breath or breathing problems  stomach or side pain, or pain at the shoulder  swelling  tiredness  trouble  passing urine or change in the amount of urine  unusual bruising or bleeding Side effects that usually do not require medical attention (report to your doctor or health care professional if they continue or are bothersome):  bone pain  muscle pain This list may not describe all possible side effects. Call your doctor for medical advice about side effects. You may report side effects to FDA at 1-800-FDA-1088. Where should I keep my medicine? Keep out of the reach of children. If you are using this medicine at home, you will be instructed on how to store it. Throw away any unused medicine after the expiration date on the label. NOTE: This sheet is a summary. It may not cover all possible information. If you have questions about this medicine, talk to your doctor, pharmacist, or health care provider.  2021 Elsevier/Gold Standard (2019-03-22 13:20:51)  

## 2020-04-15 ENCOUNTER — Other Ambulatory Visit: Payer: BC Managed Care – PPO

## 2020-04-15 ENCOUNTER — Ambulatory Visit: Payer: BC Managed Care – PPO | Admitting: Hematology and Oncology

## 2020-04-15 ENCOUNTER — Ambulatory Visit: Payer: BC Managed Care – PPO

## 2020-04-15 ENCOUNTER — Encounter: Payer: Self-pay | Admitting: *Deleted

## 2020-04-17 ENCOUNTER — Ambulatory Visit: Payer: BC Managed Care – PPO

## 2020-04-27 ENCOUNTER — Telehealth: Payer: Self-pay | Admitting: *Deleted

## 2020-04-27 NOTE — Telephone Encounter (Signed)
Pt called to inform still has "chest cold". Afebrile, completed 2nd round of abx 3 days ago. Covid test neg. Per [name] may proceed with tx on 2/16 as long as afebrile. Received verbal understanding from pt. Denies further needs.

## 2020-04-28 NOTE — Progress Notes (Addendum)
HEMATOLOGY-ONCOLOGY VISIT PROGRESS NOTE     CHIEF COMPLIANT: Cycle 3Adriamycin and Cytoxanpembrolizumab  INTERVAL HISTORY: Amanda Burns is a 40 y.o. female with above-mentioned history of left breast cancerwhounderwent bilateral mastectomies with reconstruction. She is currently on adjuvant chemotherapy with dose dense Adriamycin and Cytoxan, pembrolizumab.She presents todayfor a toxicity check and cycle 3. Her major complaints are related to fatigue which makes her take multiple naps through the day.  Denies any nausea or vomiting.  She has a poor taste.  Oncology History  Malignant neoplasm of upper-outer quadrant of left breast in female, estrogen receptor negative ([place])  11/29/2019 Initial Diagnosis   Patient palpated an area of concern in the left breast. Diagnostic mammogram and US showed a 1.8cm mass in the upper outer right breast representing a fibroadenoma, and in the left breast, three adjacent masses at the 2-2:30 position, 1.7cm, 0.8cm, and 1.4cm, with several cysts and no axillary adenopathy. Biopsy showed a benign fibroadenoma in the right breast, and in the left breast, IDC at the 2:30 position, grade 3, HER-2 negative (1+), ER+ 0%, PR+ 0%, Ki67 80%.    12/04/2019 Cancer Staging   Staging form: Breast, AJCC 8th Edition - Clinical: Stage IB (cT1c, cN0, cM0, G3, ER-, PR-, HER2-) - Signed by [name], MD on 12/04/2019   12/21/2019 Genetic Testing   Negative genetic testing:  No pathogenic variants detected on the [facility] Multi-Cancer Panel. A variant of uncertain significance (VUS) was detected in the CDK4 gene called c.415C>T (p.Arg139*). The report date is 12/21/2019.  The Multi-Cancer Panel offered by [facility] includes sequencing and/or deletion duplication testing of the following 85 genes: AIP, ALK, APC, ATM, AXIN2,BAP1,  BARD1, BLM, BMPR1A, BRCA1, BRCA2, BRIP1, CASR, CDC73, CDH1, CDK4, CDKN1B, CDKN1C, CDKN2A (p14ARF), CDKN2A (p16INK4a), CEBPA, CHEK2, CTNNA1, DICER1,  DIS3L2, EGFR (c.2369C>T, p.Thr790Met variant only), EPCAM (Deletion/duplication testing only), FH, FLCN, GATA2, GPC3, GREM1 (Promoter region deletion/duplication testing only), HOXB13 (c.251G>A, p.Gly84Glu), HRAS, KIT, MAX, MEN1, MET, MITF (c.952G>A, p.Glu318Lys variant only), MLH1, MSH2, MSH3, MSH6, MUTYH, NBN, NF1, NF2, NTHL1, PALB2, PDGFRA, PHOX2B, PMS2, POLD1, POLE, POT1, PRKAR1A, PTCH1, PTEN, RAD50, RAD51C, RAD51D, RB1, RECQL4, RET, RNF43, RUNX1, SDHAF2, SDHA (sequence changes only), SDHB, SDHC, SDHD, SMAD4, SMARCA4, SMARCB1, SMARCE1, STK11, SUFU, TERC, TERT, TMEM127, TP53, TSC1, TSC2, VHL, WRN and WT1.    01/09/2020 Surgery   Bilateral mastectomies with reconstruction [facility]):  Right breast: no evidence of malignancy Left breast: multifocal invasive ductal carcinoma, grade 3, largest 2.2cm, with high grade DCIS, clear margins, 4 left axillary lymph nodes negative for carcinoma.   03/18/2020 -  Chemotherapy    Patient is on Treatment Plan: BREAST AC Q21D / CARBOPLATIN D1 + PACLITAXEL D1,8,15 Q21D        Observations/Objective:  Today's Vitals   04/29/20 1055  BP: 112/62  Pulse: 93  Resp: 18  Temp: 97.7 F (36.5 C)  TempSrc: Temporal  SpO2: 100%  Weight: 178 lb 12.8 oz (81.1 kg)  Height: $Remove'5\' 1"'OpLHVMg$  (1.549 m)   Body mass index is 33.78 kg/m.  I have reviewed the data as listed CMP Latest Ref Rng & Units 04/09/2020 03/25/2020 03/18/2020  Glucose 70 - 99 mg/dL 124(H) 120(H) 123(H)  BUN 6 - 20 mg/dL $Remove'9 9 10  'iuMaZpD$ Creatinine 0.44 - 1.00 mg/dL 0.79 0.71 0.85  Sodium 135 - 145 mmol/L 137 136 137  Potassium 3.5 - 5.1 mmol/L 3.9 3.8 4.0  Chloride 98 - 111 mmol/L 105 104 105  CO2 22 - 32 mmol/L $RemoveB'26 25 24  'SIVPtBWL$ Calcium  8.9 - 10.3 mg/dL 8.8(L) 9.0 9.1  Total Protein 6.5 - 8.1 g/dL 7.0 7.1 7.4  Total Bilirubin 0.3 - 1.2 mg/dL <0.2(L) 0.3 0.3  Alkaline Phos 38 - 126 U/L 137(H) 109 143(H)  AST 15 - 41 U/L 32 11(L) 21  ALT 0 - 44 U/L 37 12 33    Lab Results  Component Value Date   WBC 8.8  04/29/2020   HGB 9.2 (L) 04/29/2020   HCT 29.3 (L) 04/29/2020   MCV 76.5 (L) 04/29/2020   PLT 377 04/29/2020   NEUTROABS 7.3 04/29/2020      Assessment Plan:  Malignant neoplasm of upper-outer quadrant of left breast in female, estrogen receptor negative ([facility]) Palpable left breast mass: 1.8 cm UOQ fibroadenoma in the right breast, left breast 3 masses 2 to 2:30 position: 1.7, 1.8, 1.4 cm. Biopsy revealed IDC grade 3, ER 0%, PR 0%, Ki-67 80%, HER-2 -1+  T1c M0 stage IB  Treatment plan: 1.Bilateral mastectomies 01/09/2020: Right mastectomy:PASH Left mastectomy: Multifocal IDC grade 3 2.2 cm largest, high-grade DCIS, margins negative, 0/4 sentinel lymph nodes, ER negative, PR negative, HER-2 negative, Ki-67 80% 2.I recommended adjuvant chemotherapy with dose dense Adriamycin andCytoxanpembrolizumabfollowed by Taxol, carboplatin and pembrolizumab 3.Adjuvant radiation ----------------------------------------------------------------------------------------------------------------------------------- Current treatment: Cycle 3 Adriamycin and Cytoxanand pembrolizumab every 3 weeks Labs reviewed  Chemo toxicities: 1.Mild nausea 2.chest congestion: U probably viral related 3. Profound fatigue 4.Mild hyperthyroidism TSH pending today. 5.Anemia: Patient was anemic even prior to starting cycle 1 chemo. Iron studies reveal a ferritin of 122and iron saturation of 23%, no role of IV iron therapy.  Today's hemoglobin is 9.2.  Monitoring 6.  Lack of taste  Denies any nausea or vomiting. Thyroid function will need to be monitored.With pembrolizumab initially patients may get hyperthyroid and later become hypothyroid.  Return to clinic in3 weeks for cycle 4.    I discussed the assessment and treatment plan with the patient. The patient was provided an opportunity to ask questions and all were answered. The patient agreed with the plan and demonstrated an understanding  of the instructions. The patient was advised to call back or seek an in-person evaluation if the symptoms worsen or if the condition fails to improve as anticipated.   Total time spent: 30 minutes including face-to-face MyChart video visit time and time spent for planning, charting and coordination of care  Rulon Eisenmenger, MD 04/29/2020   I, Cloyde Reams Dorshimer, am acting as scribe for [name], MD.  I have reviewed the above documentation for accuracy and completeness, and I agree with the above.

## 2020-04-28 NOTE — Assessment & Plan Note (Signed)
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 Biopsy revealed IDC grade 3, ER 0%, PR 0%, Ki-67 80%, HER-2 -1+  T1c M0 stage IB  Treatment plan: 1.Bilateral mastectomies 01/09/2020: Right mastectomy:PASH Left mastectomy: Multifocal IDC grade 3 2.2 cm largest, high-grade DCIS, margins negative, 0/4 sentinel lymph nodes, ER negative, PR negative, HER-2 negative, Ki-67 80% 2.I recommended adjuvant chemotherapy with dose dense Adriamycin andCytoxanpembrolizumabfollowed by Taxol, carboplatin and pembrolizumab 3.Adjuvant radiation ----------------------------------------------------------------------------------------------------------------------------------- Current treatment: Cycle 3 Adriamycin and Cytoxanand pembrolizumab every 3 weeks Labs reviewed  Chemo toxicities: 1.Mild nausea 2.chest congestion: Unclear etiology examination does not reveal any abnormalities. She is currently on omeprazole and she is also on antibiotic for sinus infection that is not COVID-related. 3.mild fatigue 4.Mild hyperthyroidism TSH 0.25 5.Anemia: Patient was anemic even prior to starting cycle 1 chemo. Iron studies reveal a ferritin of 122and iron saturation of 23%, no role of IV iron therapy.  Thyroid function will need to be monitored.With pembrolizumab initially patients may get hyperthyroid and later become hypothyroid.  Return to clinic in3 weeks for cycle 4.

## 2020-04-29 ENCOUNTER — Inpatient Hospital Stay: Payer: BC Managed Care – PPO

## 2020-04-29 ENCOUNTER — Other Ambulatory Visit: Payer: Self-pay

## 2020-04-29 ENCOUNTER — Inpatient Hospital Stay: Payer: BC Managed Care – PPO | Attending: Hematology and Oncology

## 2020-04-29 ENCOUNTER — Inpatient Hospital Stay (HOSPITAL_BASED_OUTPATIENT_CLINIC_OR_DEPARTMENT_OTHER): Payer: BC Managed Care – PPO | Admitting: Hematology and Oncology

## 2020-04-29 DIAGNOSIS — C50412 Malignant neoplasm of upper-outer quadrant of left female breast: Secondary | ICD-10-CM | POA: Diagnosis present

## 2020-04-29 DIAGNOSIS — D649 Anemia, unspecified: Secondary | ICD-10-CM | POA: Diagnosis not present

## 2020-04-29 DIAGNOSIS — Z95828 Presence of other vascular implants and grafts: Secondary | ICD-10-CM

## 2020-04-29 DIAGNOSIS — Z5189 Encounter for other specified aftercare: Secondary | ICD-10-CM | POA: Insufficient documentation

## 2020-04-29 DIAGNOSIS — Z803 Family history of malignant neoplasm of breast: Secondary | ICD-10-CM | POA: Diagnosis not present

## 2020-04-29 DIAGNOSIS — E039 Hypothyroidism, unspecified: Secondary | ICD-10-CM | POA: Diagnosis not present

## 2020-04-29 DIAGNOSIS — R5383 Other fatigue: Secondary | ICD-10-CM | POA: Insufficient documentation

## 2020-04-29 DIAGNOSIS — Z5111 Encounter for antineoplastic chemotherapy: Secondary | ICD-10-CM | POA: Insufficient documentation

## 2020-04-29 DIAGNOSIS — Z9013 Acquired absence of bilateral breasts and nipples: Secondary | ICD-10-CM | POA: Insufficient documentation

## 2020-04-29 DIAGNOSIS — D241 Benign neoplasm of right breast: Secondary | ICD-10-CM | POA: Diagnosis not present

## 2020-04-29 DIAGNOSIS — Z5112 Encounter for antineoplastic immunotherapy: Secondary | ICD-10-CM | POA: Insufficient documentation

## 2020-04-29 DIAGNOSIS — Z171 Estrogen receptor negative status [ER-]: Secondary | ICD-10-CM | POA: Insufficient documentation

## 2020-04-29 DIAGNOSIS — Z79899 Other long term (current) drug therapy: Secondary | ICD-10-CM | POA: Diagnosis not present

## 2020-04-29 LAB — CMP (CANCER CENTER ONLY)
ALT: 27 U/L (ref 0–44)
AST: 18 U/L (ref 15–41)
Albumin: 3.7 g/dL (ref 3.5–5.0)
Alkaline Phosphatase: 108 U/L (ref 38–126)
Anion gap: 6 (ref 5–15)
BUN: 9 mg/dL (ref 6–20)
CO2: 25 mmol/L (ref 22–32)
Calcium: 8.1 mg/dL — ABNORMAL LOW (ref 8.9–10.3)
Chloride: 106 mmol/L (ref 98–111)
Creatinine: 0.77 mg/dL (ref 0.44–1.00)
GFR, Estimated: >60 mL/min (ref >60)
Glucose, Bld: 99 mg/dL (ref 70–99)
Potassium: 3.8 mmol/L (ref 3.5–5.1)
Sodium: 137 mmol/L (ref 135–145)
Total Bilirubin: <0.2 mg/dL — ABNORMAL LOW (ref 0.3–1.2)
Total Protein: 6.7 g/dL (ref 6.5–8.1)

## 2020-04-29 LAB — CBC WITH DIFFERENTIAL (CANCER CENTER ONLY)
Abs Immature Granulocytes: 0.04 10*3/uL (ref 0.00–0.07)
Basophils Absolute: 0.1 10*3/uL (ref 0.0–0.1)
Basophils Relative: 1 %
Eosinophils Absolute: 0.1 10*3/uL (ref 0.0–0.5)
Eosinophils Relative: 1 %
HCT: 29.3 % — ABNORMAL LOW (ref 36.0–46.0)
Hemoglobin: 9.2 g/dL — ABNORMAL LOW (ref 12.0–15.0)
Immature Granulocytes: 1 %
Lymphocytes Relative: 9 %
Lymphs Abs: 0.8 10*3/uL (ref 0.7–4.0)
MCH: 24 pg — ABNORMAL LOW (ref 26.0–34.0)
MCHC: 31.4 g/dL (ref 30.0–36.0)
MCV: 76.5 fL — ABNORMAL LOW (ref 80.0–100.0)
Monocytes Absolute: 0.5 10*3/uL (ref 0.1–1.0)
Monocytes Relative: 6 %
Neutro Abs: 7.3 10*3/uL (ref 1.7–7.7)
Neutrophils Relative %: 82 %
Platelet Count: 377 10*3/uL (ref 150–400)
RBC: 3.83 MIL/uL — ABNORMAL LOW (ref 3.87–5.11)
RDW: 18.5 % — ABNORMAL HIGH (ref 11.5–15.5)
WBC Count: 8.8 10*3/uL (ref 4.0–10.5)
nRBC: 0 % (ref 0.0–0.2)

## 2020-04-29 LAB — TSH: TSH: 0.49 u[IU]/mL (ref 0.308–3.960)

## 2020-04-29 MED ORDER — SODIUM CHLORIDE 0.9% FLUSH
10.0000 mL | INTRAVENOUS | Status: DC | PRN
  Administered 2020-04-29: 10 mL via INTRAVENOUS
  Filled 2020-04-29: qty 10

## 2020-04-29 MED ORDER — SODIUM CHLORIDE 0.9 % IV SOLN
10.0000 mg | Freq: Once | INTRAVENOUS | Status: AC
  Administered 2020-04-29: 10 mg via INTRAVENOUS
  Filled 2020-04-29: qty 10

## 2020-04-29 MED ORDER — PALONOSETRON HCL INJECTION 0.25 MG/5ML
0.2500 mg | Freq: Once | INTRAVENOUS | Status: AC
  Administered 2020-04-29: 0.25 mg via INTRAVENOUS

## 2020-04-29 MED ORDER — DOXORUBICIN HCL CHEMO IV INJECTION 2 MG/ML
60.0000 mg/m2 | Freq: Once | INTRAVENOUS | Status: AC
  Administered 2020-04-29: 112 mg via INTRAVENOUS
  Filled 2020-04-29: qty 56

## 2020-04-29 MED ORDER — SODIUM CHLORIDE 0.9% FLUSH
10.0000 mL | INTRAVENOUS | Status: DC | PRN
  Administered 2020-04-29: 10 mL
  Filled 2020-04-29: qty 10

## 2020-04-29 MED ORDER — HEPARIN SOD (PORK) LOCK FLUSH 100 UNIT/ML IV SOLN
500.0000 [IU] | Freq: Once | INTRAVENOUS | Status: AC | PRN
  Administered 2020-04-29: 500 [IU]
  Filled 2020-04-29: qty 5

## 2020-04-29 MED ORDER — SODIUM CHLORIDE 0.9 % IV SOLN
200.0000 mg | Freq: Once | INTRAVENOUS | Status: AC
  Administered 2020-04-29: 200 mg via INTRAVENOUS
  Filled 2020-04-29: qty 8

## 2020-04-29 MED ORDER — SODIUM CHLORIDE 0.9 % IV SOLN
Freq: Once | INTRAVENOUS | Status: AC
  Filled 2020-04-29: qty 250

## 2020-04-29 MED ORDER — CYCLOPHOSPHAMIDE CHEMO INJECTION 1 GM
600.0000 mg/m2 | Freq: Once | INTRAMUSCULAR | Status: AC
  Administered 2020-04-29: 1120 mg via INTRAVENOUS
  Filled 2020-04-29: qty 56

## 2020-04-29 MED ORDER — SODIUM CHLORIDE 0.9 % IV SOLN
150.0000 mg | Freq: Once | INTRAVENOUS | Status: AC
  Administered 2020-04-29: 150 mg via INTRAVENOUS
  Filled 2020-04-29: qty 150

## 2020-04-29 MED ORDER — PALONOSETRON HCL INJECTION 0.25 MG/5ML
INTRAVENOUS | Status: AC
  Filled 2020-04-29: qty 5

## 2020-04-29 NOTE — Patient Instructions (Signed)
[place] Discharge Instructions for Patients Receiving Chemotherapy  Today you received the following chemotherapy agents Keytruda, Adriamycin and Cytoxan.  To help prevent nausea and vomiting after your treatment, we encourage you to take your nausea medication as directed.   If you develop nausea and vomiting that is not controlled by your nausea medication, call the clinic.   BELOW ARE SYMPTOMS THAT SHOULD BE REPORTED IMMEDIATELY:  *FEVER GREATER THAN 100.5 F  *CHILLS WITH OR WITHOUT FEVER  NAUSEA AND VOMITING THAT IS NOT CONTROLLED WITH YOUR NAUSEA MEDICATION  *UNUSUAL SHORTNESS OF BREATH  *UNUSUAL BRUISING OR BLEEDING  TENDERNESS IN MOUTH AND THROAT WITH OR WITHOUT PRESENCE OF ULCERS  *URINARY PROBLEMS  *BOWEL PROBLEMS  UNUSUAL RASH Items with * indicate a potential emergency and should be followed up as soon as possible.  Feel free to call the clinic should you have any questions or concerns. The clinic phone number is [phone].  Please show the [place] at check-in to the Emergency Department and triage nurse.  Doxorubicin injection What is this medicine? DOXORUBICIN (dox oh ROO bi sin) is a chemotherapy drug. It is used to treat many kinds of cancer like leukemia, lymphoma, neuroblastoma, sarcoma, and Wilms' tumor. It is also used to treat bladder cancer, breast cancer, lung cancer, ovarian cancer, stomach cancer, and thyroid cancer. This medicine may be used for other purposes; ask your health care provider or pharmacist if you have questions. COMMON BRAND NAME(S): Adriamycin, Adriamycin PFS, Adriamycin RDF, Rubex What should I tell my health care provider before I take this medicine? They need to know if you have any of these conditions:  heart disease  history of low blood counts caused by a medicine  liver disease  recent or ongoing radiation therapy  an unusual or allergic reaction to doxorubicin, other chemotherapy agents,  other medicines, foods, dyes, or preservatives  pregnant or trying to get pregnant  breast-feeding How should I use this medicine? This drug is given as an infusion into a vein. It is administered in a hospital or clinic by a specially trained health care professional. If you have pain, swelling, burning or any unusual feeling around the site of your injection, tell your health care professional right away. Talk to your pediatrician regarding the use of this medicine in children. Special care may be needed. Overdosage: If you think you have taken too much of this medicine contact a poison control center or emergency room at once. NOTE: This medicine is only for you. Do not share this medicine with others. What if I miss a dose? It is important not to miss your dose. Call your doctor or health care professional if you are unable to keep an appointment. What may interact with this medicine? This medicine may interact with the following medications:  6-mercaptopurine  paclitaxel  phenytoin  St. John's Wort  trastuzumab  verapamil This list may not describe all possible interactions. Give your health care provider a list of all the medicines, herbs, non-prescription drugs, or dietary supplements you use. Also tell them if you smoke, drink alcohol, or use illegal drugs. Some items may interact with your medicine. What should I watch for while using this medicine? This drug may make you feel generally unwell. This is not uncommon, as chemotherapy can affect healthy cells as well as cancer cells. Report any side effects. Continue your course of treatment even though you feel ill unless your doctor tells you to stop. There is a maximum  amount of this medicine you should receive throughout your life. The amount depends on the medical condition being treated and your overall health. Your doctor will watch how much of this medicine you receive in your lifetime. Tell your doctor if you have taken  this medicine before. You may need blood work done while you are taking this medicine. Your urine may turn red for a few days after your dose. This is not blood. If your urine is dark or brown, call your doctor. In some cases, you may be given additional medicines to help with side effects. Follow all directions for their use. Call your doctor or health care professional for advice if you get a fever, chills or sore throat, or other symptoms of a cold or flu. Do not treat yourself. This drug decreases your body's ability to fight infections. Try to avoid being around people who are sick. This medicine may increase your risk to bruise or bleed. Call your doctor or health care professional if you notice any unusual bleeding. Talk to your doctor about your risk of cancer. You may be more at risk for certain types of cancers if you take this medicine. Do not become pregnant while taking this medicine or for 6 months after stopping it. Women should inform their doctor if they wish to become pregnant or think they might be pregnant. Men should not father a child while taking this medicine and for 6 months after stopping it. There is a potential for serious side effects to an unborn child. Talk to your health care professional or pharmacist for more information. Do not breast-feed an infant while taking this medicine. This medicine has caused ovarian failure in some women and reduced sperm counts in some men This medicine may interfere with the ability to have a child. Talk with your doctor or health care professional if you are concerned about your fertility. This medicine may cause a decrease in Co-Enzyme Q-10. You should make sure that you get enough Co-Enzyme Q-10 while you are taking this medicine. Discuss the foods you eat and the vitamins you take with your health care professional. What side effects may I notice from receiving this medicine? Side effects that you should report to your doctor or health  care professional as soon as possible:  allergic reactions like skin rash, itching or hives, swelling of the face, lips, or tongue  breathing problems  chest pain  fast or irregular heartbeat  low blood counts - this medicine may decrease the number of white blood cells, red blood cells and platelets. You may be at increased risk for infections and bleeding.  pain, redness, or irritation at site where injected  signs of infection - fever or chills, cough, sore throat, pain or difficulty passing urine  signs of decreased platelets or bleeding - bruising, pinpoint red spots on the skin, black, tarry stools, blood in the urine  swelling of the ankles, feet, hands  tiredness  weakness Side effects that usually do not require medical attention (report to your doctor or health care professional if they continue or are bothersome):  diarrhea  hair loss  mouth sores  nail discoloration or damage  nausea  red colored urine  vomiting This list may not describe all possible side effects. Call your doctor for medical advice about side effects. You may report side effects to FDA at 1-800-FDA-1088. Where should I keep my medicine? This drug is given in a hospital or clinic and will not be stored at  home. NOTE: This sheet is a summary. It may not cover all possible information. If you have questions about this medicine, talk to your doctor, pharmacist, or health care provider.  2020 Elsevier/Gold Standard (2016-10-12 11:01:26)  Cyclophosphamide Injection What is this medicine? CYCLOPHOSPHAMIDE (sye kloe FOSS fa mide) is a chemotherapy drug. It slows the growth of cancer cells. This medicine is used to treat many types of cancer like lymphoma, myeloma, leukemia, breast cancer, and ovarian cancer, to name a few. This medicine may be used for other purposes; ask your health care provider or pharmacist if you have questions. COMMON BRAND NAME(S): Cytoxan, Neosar What should I tell my  health care provider before I take this medicine? They need to know if you have any of these conditions:  heart disease  history of irregular heartbeat  infection  kidney disease  liver disease  low blood counts, like white cells, platelets, or red blood cells  on hemodialysis  recent or ongoing radiation therapy  scarring or thickening of the lungs  trouble passing urine  an unusual or allergic reaction to cyclophosphamide, other medicines, foods, dyes, or preservatives  pregnant or trying to get pregnant  breast-feeding How should I use this medicine? This drug is usually given as an injection into a vein or muscle or by infusion into a vein. It is administered in a hospital or clinic by a specially trained health care professional. Talk to your pediatrician regarding the use of this medicine in children. Special care may be needed. Overdosage: If you think you have taken too much of this medicine contact a poison control center or emergency room at once. NOTE: This medicine is only for you. Do not share this medicine with others. What if I miss a dose? It is important not to miss your dose. Call your doctor or health care professional if you are unable to keep an appointment. What may interact with this medicine?  amphotericin B  azathioprine  certain antivirals for HIV or hepatitis  certain medicines for blood pressure, heart disease, irregular heart beat  certain medicines that treat or prevent blood clots like warfarin  certain other medicines for cancer  cyclosporine  etanercept  indomethacin  medicines that relax muscles for surgery  medicines to increase blood counts  metronidazole This list may not describe all possible interactions. Give your health care provider a list of all the medicines, herbs, non-prescription drugs, or dietary supplements you use. Also tell them if you smoke, drink alcohol, or use illegal drugs. Some items may interact with  your medicine. What should I watch for while using this medicine? Your condition will be monitored carefully while you are receiving this medicine. You may need blood work done while you are taking this medicine. Drink water or other fluids as directed. Urinate often, even at night. Some products may contain alcohol. Ask your health care professional if this medicine contains alcohol. Be sure to tell all health care professionals you are taking this medicine. Certain medicines, like metronidazole and disulfiram, can cause an unpleasant reaction when taken with alcohol. The reaction includes flushing, headache, nausea, vomiting, sweating, and increased thirst. The reaction can last from 30 minutes to several hours. Do not become pregnant while taking this medicine or for 1 year after stopping it. Women should inform their health care professional if they wish to become pregnant or think they might be pregnant. Men should not father a child while taking this medicine and for 4 months after stopping it. There  is potential for serious side effects to an unborn child. Talk to your health care professional for more information. Do not breast-feed an infant while taking this medicine or for 1 week after stopping it. This medicine has caused ovarian failure in some women. This medicine may make it more difficult to get pregnant. Talk to your health care professional if you are concerned about your fertility. This medicine has caused decreased sperm counts in some men. This may make it more difficult to father a child. Talk to your health care professional if you are concerned about your fertility. Call your health care professional for advice if you get a fever, chills, or sore throat, or other symptoms of a cold or flu. Do not treat yourself. This medicine decreases your body's ability to fight infections. Try to avoid being around people who are sick. Avoid taking medicines that contain aspirin, acetaminophen,  ibuprofen, naproxen, or ketoprofen unless instructed by your health care professional. These medicines may hide a fever. Talk to your health care professional about your risk of cancer. You may be more at risk for certain types of cancer if you take this medicine. If you are going to need surgery or other procedure, tell your health care professional that you are using this medicine. Be careful brushing or flossing your teeth or using a toothpick because you may get an infection or bleed more easily. If you have any dental work done, tell your dentist you are receiving this medicine. What side effects may I notice from receiving this medicine? Side effects that you should report to your doctor or health care professional as soon as possible:  allergic reactions like skin rash, itching or hives, swelling of the face, lips, or tongue  breathing problems  nausea, vomiting  signs and symptoms of bleeding such as bloody or black, tarry stools; red or dark brown urine; spitting up blood or brown material that looks like coffee grounds; red spots on the skin; unusual bruising or bleeding from the eyes, gums, or nose  signs and symptoms of heart failure like fast, irregular heartbeat, sudden weight gain; swelling of the ankles, feet, hands  signs and symptoms of infection like fever; chills; cough; sore throat; pain or trouble passing urine  signs and symptoms of kidney injury like trouble passing urine or change in the amount of urine  signs and symptoms of liver injury like dark yellow or brown urine; general ill feeling or flu-like symptoms; light-colored stools; loss of appetite; nausea; right upper belly pain; unusually weak or tired; yellowing of the eyes or skin Side effects that usually do not require medical attention (report to your doctor or health care professional if they continue or are bothersome):  confusion  decreased hearing  diarrhea  facial flushing  hair  loss  headache  loss of appetite  missed menstrual periods  signs and symptoms of low red blood cells or anemia such as unusually weak or tired; feeling faint or lightheaded; falls  skin discoloration This list may not describe all possible side effects. Call your doctor for medical advice about side effects. You may report side effects to FDA at 1-800-FDA-1088. Where should I keep my medicine? This drug is given in a hospital or clinic and will not be stored at home. NOTE: This sheet is a summary. It may not cover all possible information. If you have questions about this medicine, talk to your doctor, pharmacist, or health care provider.  2020 Elsevier/Gold Standard (2018-12-03 09:53:29)  Pembrolizumab  injection What is this medicine? PEMBROLIZUMAB (pem broe liz ue mab) is a monoclonal antibody. It is used to treat certain types of cancer. This medicine may be used for other purposes; ask your health care provider or pharmacist if you have questions. COMMON BRAND NAME(S): Keytruda What should I tell my health care provider before I take this medicine? They need to know if you have any of these conditions:  diabetes  immune system problems  inflammatory bowel disease  liver disease  lung or breathing disease  lupus  received or scheduled to receive an organ transplant or a stem-cell transplant that uses donor stem cells  an unusual or allergic reaction to pembrolizumab, other medicines, foods, dyes, or preservatives  pregnant or trying to get pregnant  breast-feeding How should I use this medicine? This medicine is for infusion into a vein. It is given by a health care professional in a hospital or clinic setting. A special MedGuide will be given to you before each treatment. Be sure to read this information carefully each time. Talk to your pediatrician regarding the use of this medicine in children. While this drug may be prescribed for children as young as 6 months  for selected conditions, precautions do apply. Overdosage: If you think you have taken too much of this medicine contact a poison control center or emergency room at once. NOTE: This medicine is only for you. Do not share this medicine with others. What if I miss a dose? It is important not to miss your dose. Call your doctor or health care professional if you are unable to keep an appointment. What may interact with this medicine? Interactions have not been studied. Give your health care provider a list of all the medicines, herbs, non-prescription drugs, or dietary supplements you use. Also tell them if you smoke, drink alcohol, or use illegal drugs. Some items may interact with your medicine. This list may not describe all possible interactions. Give your health care provider a list of all the medicines, herbs, non-prescription drugs, or dietary supplements you use. Also tell them if you smoke, drink alcohol, or use illegal drugs. Some items may interact with your medicine. What should I watch for while using this medicine? Your condition will be monitored carefully while you are receiving this medicine. You may need blood work done while you are taking this medicine. Do not become pregnant while taking this medicine or for 4 months after stopping it. Women should inform their doctor if they wish to become pregnant or think they might be pregnant. There is a potential for serious side effects to an unborn child. Talk to your health care professional or pharmacist for more information. Do not breast-feed an infant while taking this medicine or for 4 months after the last dose. What side effects may I notice from receiving this medicine? Side effects that you should report to your doctor or health care professional as soon as possible:  allergic reactions like skin rash, itching or hives, swelling of the face, lips, or tongue  bloody or black, tarry  breathing problems  changes in  vision  chest pain  chills  confusion  constipation  cough  diarrhea  dizziness or feeling faint or lightheaded  fast or irregular heartbeat  fever  flushing  joint pain  low blood counts - this medicine may decrease the number of white blood cells, red blood cells and platelets. You may be at increased risk for infections and bleeding.  muscle pain  muscle  weakness  pain, tingling, numbness in the hands or feet  persistent headache  redness, blistering, peeling or loosening of the skin, including inside the mouth  signs and symptoms of high blood sugar such as dizziness; dry mouth; dry skin; fruity breath; nausea; stomach pain; increased hunger or thirst; increased urination  signs and symptoms of kidney injury like trouble passing urine or change in the amount of urine  signs and symptoms of liver injury like dark urine, light-colored stools, loss of appetite, nausea, right upper belly pain, yellowing of the eyes or skin  sweating  swollen lymph nodes  weight loss Side effects that usually do not require medical attention (report to your doctor or health care professional if they continue or are bothersome):  decreased appetite  hair loss  muscle pain  tiredness This list may not describe all possible side effects. Call your doctor for medical advice about side effects. You may report side effects to FDA at 1-800-FDA-1088. Where should I keep my medicine? This drug is given in a hospital or clinic and will not be stored at home. NOTE: This sheet is a summary. It may not cover all possible information. If you have questions about this medicine, talk to your doctor, pharmacist, or health care provider.  2020 Elsevier/Gold Standard (2019-01-04 18:07:58)

## 2020-04-29 NOTE — Patient Instructions (Signed)

## 2020-04-30 ENCOUNTER — Encounter: Payer: Self-pay | Admitting: *Deleted

## 2020-05-01 ENCOUNTER — Inpatient Hospital Stay: Payer: BC Managed Care – PPO

## 2020-05-01 ENCOUNTER — Other Ambulatory Visit: Payer: Self-pay

## 2020-05-01 VITALS — BP 119/78 | HR 90 | Temp 98.8°F | Resp 18

## 2020-05-01 DIAGNOSIS — C50412 Malignant neoplasm of upper-outer quadrant of left female breast: Secondary | ICD-10-CM

## 2020-05-01 DIAGNOSIS — Z5112 Encounter for antineoplastic immunotherapy: Secondary | ICD-10-CM | POA: Diagnosis not present

## 2020-05-01 MED ORDER — PEGFILGRASTIM-JMDB 6 MG/0.6ML ~~LOC~~ SOSY
PREFILLED_SYRINGE | SUBCUTANEOUS | Status: AC
  Filled 2020-05-01: qty 0.6

## 2020-05-01 MED ORDER — PEGFILGRASTIM-JMDB 6 MG/0.6ML ~~LOC~~ SOSY
6.0000 mg | PREFILLED_SYRINGE | Freq: Once | SUBCUTANEOUS | Status: AC
  Administered 2020-05-01: 6 mg via SUBCUTANEOUS

## 2020-05-01 NOTE — Patient Instructions (Signed)
Pegfilgrastim injection What is this medicine? PEGFILGRASTIM (PEG fil gra stim) is a long-acting granulocyte colony-stimulating factor that stimulates the growth of neutrophils, a type of white blood cell important in the body's fight against infection. It is used to reduce the incidence of fever and infection in patients with certain types of cancer who are receiving chemotherapy that affects the bone marrow, and to increase survival after being exposed to high doses of radiation. This medicine may be used for other purposes; ask your health care provider or pharmacist if you have questions. COMMON BRAND NAME(S): Fulphila, Neulasta, Nyvepria, UDENYCA, Ziextenzo What should I tell my health care provider before I take this medicine? They need to know if you have any of these conditions:  kidney disease  latex allergy  ongoing radiation therapy  sickle cell disease  skin reactions to acrylic adhesives (On-Body Injector only)  an unusual or allergic reaction to pegfilgrastim, filgrastim, other medicines, foods, dyes, or preservatives  pregnant or trying to get pregnant  breast-feeding How should I use this medicine? This medicine is for injection under the skin. If you get this medicine at home, you will be taught how to prepare and give the pre-filled syringe or how to use the On-body Injector. Refer to the patient Instructions for Use for detailed instructions. Use exactly as directed. Tell your healthcare provider immediately if you suspect that the On-body Injector may not have performed as intended or if you suspect the use of the On-body Injector resulted in a missed or partial dose. It is important that you put your used needles and syringes in a special sharps container. Do not put them in a trash can. If you do not have a sharps container, call your pharmacist or healthcare provider to get one. Talk to your pediatrician regarding the use of this medicine in children. While this drug  may be prescribed for selected conditions, precautions do apply. Overdosage: If you think you have taken too much of this medicine contact a poison control center or emergency room at once. NOTE: This medicine is only for you. Do not share this medicine with others. What if I miss a dose? It is important not to miss your dose. Call your doctor or health care professional if you miss your dose. If you miss a dose due to an On-body Injector failure or leakage, a new dose should be administered as soon as possible using a single prefilled syringe for manual use. What may interact with this medicine? Interactions have not been studied. This list may not describe all possible interactions. Give your health care provider a list of all the medicines, herbs, non-prescription drugs, or dietary supplements you use. Also tell them if you smoke, drink alcohol, or use illegal drugs. Some items may interact with your medicine. What should I watch for while using this medicine? Your condition will be monitored carefully while you are receiving this medicine. You may need blood work done while you are taking this medicine. Talk to your health care provider about your risk of cancer. You may be more at risk for certain types of cancer if you take this medicine. If you are going to need a MRI, CT scan, or other procedure, tell your doctor that you are using this medicine (On-Body Injector only). What side effects may I notice from receiving this medicine? Side effects that you should report to your doctor or health care professional as soon as possible:  allergic reactions (skin rash, itching or hives, swelling of   the face, lips, or tongue)  back pain  dizziness  fever  pain, redness, or irritation at site where injected  pinpoint red spots on the skin  red or dark-brown urine  shortness of breath or breathing problems  stomach or side pain, or pain at the shoulder  swelling  tiredness  trouble  passing urine or change in the amount of urine  unusual bruising or bleeding Side effects that usually do not require medical attention (report to your doctor or health care professional if they continue or are bothersome):  bone pain  muscle pain This list may not describe all possible side effects. Call your doctor for medical advice about side effects. You may report side effects to FDA at 1-800-FDA-1088. Where should I keep my medicine? Keep out of the reach of children. If you are using this medicine at home, you will be instructed on how to store it. Throw away any unused medicine after the expiration date on the label. NOTE: This sheet is a summary. It may not cover all possible information. If you have questions about this medicine, talk to your doctor, pharmacist, or health care provider.  2021 Elsevier/Gold Standard (2019-03-22 13:20:51)  

## 2020-05-08 ENCOUNTER — Other Ambulatory Visit: Payer: Self-pay

## 2020-05-08 ENCOUNTER — Ambulatory Visit (HOSPITAL_COMMUNITY)
Admission: RE | Admit: 2020-05-08 | Discharge: 2020-05-08 | Disposition: A | Discharge status: Home / Self Care | Payer: BC Managed Care – PPO | Source: Ambulatory Visit | Attending: Hematology and Oncology | Admitting: Hematology and Oncology

## 2020-05-08 DIAGNOSIS — Z5181 Encounter for therapeutic drug level monitoring: Secondary | ICD-10-CM

## 2020-05-08 DIAGNOSIS — K219 Gastro-esophageal reflux disease without esophagitis: Secondary | ICD-10-CM | POA: Insufficient documentation

## 2020-05-08 DIAGNOSIS — Z853 Personal history of malignant neoplasm of breast: Secondary | ICD-10-CM | POA: Diagnosis not present

## 2020-05-08 DIAGNOSIS — Z79899 Other long term (current) drug therapy: Secondary | ICD-10-CM | POA: Diagnosis present

## 2020-05-08 LAB — ECHOCARDIOGRAM COMPLETE
Area-P 1/2: 2.76 cm2
S' Lateral: 3.4 cm

## 2020-05-08 NOTE — Progress Notes (Signed)
  Echocardiogram 2D Echocardiogram has been performed.  Mikea Quadros G Rickesha Veracruz 05/08/2020, 10:06 AM

## 2020-05-11 ENCOUNTER — Encounter: Payer: Self-pay | Admitting: Hematology and Oncology

## 2020-05-14 ENCOUNTER — Telehealth: Payer: Self-pay | Admitting: Hematology and Oncology

## 2020-05-14 ENCOUNTER — Telehealth: Payer: Self-pay | Admitting: *Deleted

## 2020-05-14 NOTE — Telephone Encounter (Signed)
Received call from patient requesting her appointments for 3/10 be cancelled and r/s for 3/23. She has continued to have what she calls a cold every time she gets her A/C. Her complaints are cough and congestion, fatigue. She states she has had chest xrays and covid testing which have shown nothing and has been on 2 rounds of antibiotics. She denies shortness of breath.  She states she feels like "nobody cares and not listening". Encouraged her to keep her appointment on 3/10 even if it was just to see [name] to be evaluated. She refuses. Offered another chest xray and encouraged her to test again for COVID just make sure. She also refuses to do this as well. Scheduling message sent to r/s her appointments per her request.

## 2020-05-14 NOTE — Telephone Encounter (Signed)
Called pt per 3/3 sch msg - no answer. Left message for patient to call back to reschedule.   

## 2020-05-15 ENCOUNTER — Telehealth: Payer: Self-pay | Admitting: *Deleted

## 2020-05-15 ENCOUNTER — Encounter: Payer: Self-pay | Admitting: *Deleted

## 2020-05-15 NOTE — Telephone Encounter (Signed)
Left message on patient's voicemail for a return call.  [name] would like her to have a CT chest w/contrast to rule out pembro related pneumonitis

## 2020-05-18 ENCOUNTER — Telehealth: Payer: Self-pay | Admitting: *Deleted

## 2020-05-18 ENCOUNTER — Other Ambulatory Visit: Payer: Self-pay | Admitting: *Deleted

## 2020-05-18 DIAGNOSIS — C50412 Malignant neoplasm of upper-outer quadrant of left female breast: Secondary | ICD-10-CM

## 2020-05-18 NOTE — Telephone Encounter (Signed)
Received call back from patient and explained the need for a ct scan chest.  Patient is agreeable.  ORders placed and I will get scheduling to call with an appointment.  Patient verbalized understanding.

## 2020-05-21 ENCOUNTER — Other Ambulatory Visit: Payer: BC Managed Care – PPO

## 2020-05-21 ENCOUNTER — Ambulatory Visit: Payer: BC Managed Care – PPO

## 2020-05-21 ENCOUNTER — Ambulatory Visit: Payer: BC Managed Care – PPO | Admitting: Hematology and Oncology

## 2020-05-21 ENCOUNTER — Encounter: Payer: Self-pay | Admitting: *Deleted

## 2020-05-26 ENCOUNTER — Telehealth: Payer: Self-pay | Admitting: Hematology and Oncology

## 2020-05-26 NOTE — Telephone Encounter (Signed)
Scheduled appt per 3/14 sch msg. Will have updated calendar printed for patient at next visit

## 2020-05-28 ENCOUNTER — Other Ambulatory Visit: Payer: Self-pay

## 2020-05-28 ENCOUNTER — Encounter (HOSPITAL_COMMUNITY): Payer: Self-pay

## 2020-05-28 ENCOUNTER — Ambulatory Visit (HOSPITAL_COMMUNITY)
Admission: RE | Admit: 2020-05-28 | Discharge: 2020-05-28 | Disposition: A | Discharge status: Home / Self Care | Payer: BC Managed Care – PPO | Source: Ambulatory Visit | Attending: Hematology and Oncology | Admitting: Hematology and Oncology

## 2020-05-28 DIAGNOSIS — Z171 Estrogen receptor negative status [ER-]: Secondary | ICD-10-CM | POA: Diagnosis present

## 2020-05-28 DIAGNOSIS — C50412 Malignant neoplasm of upper-outer quadrant of left female breast: Secondary | ICD-10-CM | POA: Insufficient documentation

## 2020-05-28 MED ORDER — IOHEXOL 300 MG/ML  SOLN
75.0000 mL | Freq: Once | INTRAMUSCULAR | Status: AC | PRN
  Administered 2020-05-28: 75 mL via INTRAVENOUS

## 2020-05-29 ENCOUNTER — Other Ambulatory Visit: Payer: Self-pay | Admitting: *Deleted

## 2020-05-29 ENCOUNTER — Encounter: Payer: Self-pay | Admitting: Hematology and Oncology

## 2020-05-29 DIAGNOSIS — C50412 Malignant neoplasm of upper-outer quadrant of left female breast: Secondary | ICD-10-CM

## 2020-05-29 DIAGNOSIS — Z171 Estrogen receptor negative status [ER-]: Secondary | ICD-10-CM

## 2020-05-29 NOTE — Progress Notes (Signed)
Pt recent CT scan showing area of enhancement in the right posterior hepatic lobe.  Per MD MRI with and without contrast of the Liver needing to be ordered for further evaluation.  Orders placed, apt will be schedule once PA is received.

## 2020-06-01 ENCOUNTER — Encounter: Payer: Self-pay | Admitting: *Deleted

## 2020-06-02 NOTE — Assessment & Plan Note (Signed)
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 Biopsy revealed IDC grade 3, ER 0%, PR 0%, Ki-67 80%, HER-2 -1+  T1c M0 stage IB  Treatment plan: 1.Bilateral mastectomies 01/09/2020: Right mastectomy:PASH Left mastectomy: Multifocal IDC grade 3 2.2 cm largest, high-grade DCIS, margins negative, 0/4 sentinel lymph nodes, ER negative, PR negative, HER-2 negative, Ki-67 80% 2.I recommended adjuvant chemotherapy with dose dense Adriamycin andCytoxanpembrolizumabfollowed by Taxol, carboplatin and pembrolizumab 3.Adjuvant radiation ----------------------------------------------------------------------------------------------------------------------------------- Current treatment: Cycle4Adriamycin and Cytoxanand pembrolizumab every 3 weeks Labs reviewed  Chemo toxicities: 1.Mild nausea 2.chest congestion: U probably viral related 3. Profound fatigue 4.Mild hyperthyroidism TSH pending today. 5.Anemia: Patient was anemic even prior to starting cycle 1 chemo. Iron studies reveal a ferritin of 122and iron saturation of 23%, no role of IV iron therapy.  Today's hemoglobin is 9.2.  Monitoring 6.  Lack of taste  CT Chest: 05/28/20: vague Rt hepatic lobe lesion 2.5 cm (MRI liver ordered), 3 mm Rt lung nodule, fatty liver, Left Thyroid nodule 

## 2020-06-02 NOTE — Progress Notes (Signed)
Patient Care Team: Patient, [name] as PCP - General (General Practice) Amanda Kaufmann, RN as Oncology Nurse Navigator Amanda Germany, RN as Oncology Nurse Navigator Amanda Klein, MD as Consulting Physician (General Surgery) Amanda Lose, MD as Consulting Physician (Hematology and Oncology) Amanda Pray, MD as Consulting Physician (Radiation Oncology)  DIAGNOSIS:    ICD-10-CM   1. Malignant neoplasm of upper-outer quadrant of left breast in female, estrogen receptor negative ([place])  C50.412    Z17.1     SUMMARY OF ONCOLOGIC HISTORY: Oncology History  Malignant neoplasm of upper-outer quadrant of left breast in female, estrogen receptor negative ([place])  11/29/2019 Initial Diagnosis   Patient palpated an area of concern in the left breast. Diagnostic mammogram and US showed a 1.8cm mass in the upper outer right breast representing a fibroadenoma, and in the left breast, three adjacent masses at the 2-2:30 position, 1.7cm, 0.8cm, and 1.4cm, with several cysts and no axillary adenopathy. Biopsy showed a benign fibroadenoma in the right breast, and in the left breast, IDC at the 2:30 position, grade 3, HER-2 negative (1+), ER+ 0%, PR+ 0%, Ki67 80%.    12/04/2019 Cancer Staging   Staging form: Breast, AJCC 8th Edition - Clinical: Stage IB (cT1c, cN0, cM0, G3, ER-, PR-, HER2-) - Signed by Amanda Lose, MD on 12/04/2019   12/21/2019 Genetic Testing   Negative genetic testing:  No pathogenic variants detected on the [facility] Multi-Cancer Panel. A variant of uncertain significance (VUS) was detected in the CDK4 gene called c.415C>T (p.Arg139*). The report date is 12/21/2019.  The Multi-Cancer Panel offered by [facility] includes sequencing and/or deletion duplication testing of the following 85 genes: AIP, ALK, APC, ATM, AXIN2,BAP1,  BARD1, BLM, BMPR1A, BRCA1, BRCA2, BRIP1, CASR, CDC73, CDH1, CDK4, CDKN1B, CDKN1C, CDKN2A (p14ARF), CDKN2A (p16INK4a), CEBPA, CHEK2, CTNNA1, DICER1, DIS3L2, EGFR  (c.2369C>T, p.Thr790Met variant only), EPCAM (Deletion/duplication testing only), FH, FLCN, GATA2, GPC3, GREM1 (Promoter region deletion/duplication testing only), HOXB13 (c.251G>A, p.Gly84Glu), HRAS, KIT, MAX, MEN1, MET, MITF (c.952G>A, p.Glu318Lys variant only), MLH1, MSH2, MSH3, MSH6, MUTYH, NBN, NF1, NF2, NTHL1, PALB2, PDGFRA, PHOX2B, PMS2, POLD1, POLE, POT1, PRKAR1A, PTCH1, PTEN, RAD50, RAD51C, RAD51D, RB1, RECQL4, RET, RNF43, RUNX1, SDHAF2, SDHA (sequence changes only), SDHB, SDHC, SDHD, SMAD4, SMARCA4, SMARCB1, SMARCE1, STK11, SUFU, TERC, TERT, TMEM127, TP53, TSC1, TSC2, VHL, WRN and WT1.    01/09/2020 Surgery   Bilateral mastectomies with reconstruction [facility] Thim[facility]):  Right breast: no evidence of malignancy Left breast: multifocal invasive ductal carcinoma, grade 3, largest 2.2cm, with high grade DCIS, clear margins, 4 left axillary lymph nodes negative for carcinoma.   03/18/2020 -  Chemotherapy    Patient is on Treatment Plan: BREAST AC Q21D / CARBOPLATIN D1 + PACLITAXEL D1,8,15 Q21D        CHIEF COMPLIANT: Cycle 4Day 1Adriamycin and Cytoxanpembrolizumab  INTERVAL HISTORY: Amanda Burns is a 40 y.o. with above-mentioned history of left breast cancerwhounderwent bilateral mastectomies with reconstruction. She is currently on adjuvant chemotherapy with dose dense Adriamycin and Cytoxan, pembrolizumab.Chest CT on 05/28/20 showed a vague area of enhancement in the right hepatic lobe, 2.5cm, a small 0.3cm right pulmonary nodule and prominent left axillary lymph node. She presents to the clinic todayfor a toxicity check and cycle 2.   Continues to have fatigue issues.  Denies any nausea vomiting or diarrhea.  Slight discomfort in the scalp.  ALLERGIES:  is allergic to adhesive [tape], benzoyl peroxide, and hydrocodone.  MEDICATIONS:  Current Outpatient Medications  Medication Sig Dispense Refill  . acetaminophen (TYLENOL)  500 MG tablet Take 1,000 mg by mouth every 6 (six)  hours as needed for moderate pain or headache.    . Adapalene 0.3 % gel Apply 1 application topically at bedtime as needed (acne).     Marland Kitchen amphetamine-dextroamphetamine (ADDERALL XR) 20 MG 24 hr capsule Take 20 mg by mouth daily.     Marland Kitchen bismuth subsalicylate (PEPTO BISMOL) 262 MG/15ML suspension Take 30 mLs by mouth every 6 (six) hours as needed for indigestion or diarrhea or loose stools.    Marland Kitchen buPROPion (WELLBUTRIN XL) 150 MG 24 hr tablet Take 150 mg by mouth daily.    Marland Kitchen escitalopram (LEXAPRO) 20 MG tablet Take 20 mg by mouth daily.    . fluticasone (FLONASE) 50 MCG/ACT nasal spray Place 1 spray into both nostrils daily as needed for allergies or rhinitis.    Marland Kitchen ibuprofen (ADVIL) 200 MG tablet Take 400 mg by mouth every 6 (six) hours as needed for headache or moderate pain.    . Ibuprofen-diphenhydrAMINE HCl (IBUPROFEN PM) 200-25 MG CAPS Take 2 tablets by mouth at bedtime as needed (sleep).    Marland Kitchen lidocaine-prilocaine (EMLA) cream Apply to affected area once (Patient taking differently: Apply 1 application topically daily as needed (port access). ) 30 g 3  . LORazepam (ATIVAN) 0.5 MG tablet Take 1 tablet (0.5 mg total) by mouth at bedtime as needed for sleep. 30 tablet 0  . Multiple Vitamins-Minerals (MULTIVITAMIN WITH MINERALS) tablet Take 1 tablet by mouth daily.    Marland Kitchen omeprazole (PRILOSEC) 20 MG capsule Take 20 mg by mouth daily.    . ondansetron (ZOFRAN) 8 MG tablet Take 1 tablet (8 mg total) by mouth 2 (two) times daily as needed. Start on the third day after chemotherapy. 30 tablet 1  . prochlorperazine (COMPAZINE) 10 MG tablet Take 1 tablet (10 mg total) by mouth every 6 (six) hours as needed (Nausea or vomiting). 30 tablet 1   No current facility-administered medications for this visit.   Facility-Administered Medications Ordered in Other Visits  Medication Dose Route Frequency Provider Last Rate Last Admin  . sodium chloride flush (NS) 0.9 % injection 10 mL  10 mL Intravenous PRN Amanda Lose, MD   10 mL at 03/18/20 1033    PHYSICAL EXAMINATION: ECOG PERFORMANCE STATUS: 1 - Symptomatic but completely ambulatory  Vitals:   06/03/20 1121  BP: (!) 141/81  Pulse: 99  Resp: 20  Temp: 97.7 F (36.5 C)  SpO2: 100%   Filed Weights   06/03/20 1121  Weight: 174 lb (78.9 kg)    LABORATORY DATA:  I have reviewed the data as listed CMP Latest Ref Rng & Units 04/29/2020 04/09/2020 03/25/2020  Glucose 70 - 99 mg/dL 99 124(H) 120(H)  BUN 6 - 20 mg/dL $Remove'9 9 9  'OfuznFx$ Creatinine 0.44 - 1.00 mg/dL 0.77 0.79 0.71  Sodium 135 - 145 mmol/L 137 137 136  Potassium 3.5 - 5.1 mmol/L 3.8 3.9 3.8  Chloride 98 - 111 mmol/L 106 105 104  CO2 22 - 32 mmol/L $RemoveB'25 26 25  'qQMjnYti$ Calcium 8.9 - 10.3 mg/dL 8.1(L) 8.8(L) 9.0  Total Protein 6.5 - 8.1 g/dL 6.7 7.0 7.1  Total Bilirubin 0.3 - 1.2 mg/dL <0.2(L) <0.2(L) 0.3  Alkaline Phos 38 - 126 U/L 108 137(H) 109  AST 15 - 41 U/L 18 32 11(L)  ALT 0 - 44 U/L 27 37 12    Lab Results  Component Value Date   WBC 4.5 06/03/2020   HGB 9.9 (L) 06/03/2020  HCT 31.7 (L) 06/03/2020   MCV 78.1 (L) 06/03/2020   PLT 395 06/03/2020   NEUTROABS 2.7 06/03/2020    ASSESSMENT & PLAN:  Malignant neoplasm of upper-outer quadrant of left breast in female, estrogen receptor negative (HCC) Palpable left breast mass: 1.8 cm UOQ fibroadenoma in the right breast, left breast 3 masses 2 to 2:30 position: 1.7, 1.8, 1.4 cm. Biopsy revealed IDC grade 3, ER 0%, PR 0%, Ki-67 80%, HER-2 -1+  T1c M0 stage IB  Treatment plan: 1.Bilateral mastectomies 01/09/2020: Right mastectomy:PASH Left mastectomy: Multifocal IDC grade 3 2.2 cm largest, high-grade DCIS, margins negative, 0/4 sentinel lymph nodes, ER negative, PR negative, HER-2 negative, Ki-67 80% 2.I recommended adjuvant chemotherapy with dose dense Adriamycin andCytoxanpembrolizumabfollowed by Taxol, carboplatin and pembrolizumab 3.Adjuvant  radiation ----------------------------------------------------------------------------------------------------------------------------------- Current treatment: Cycle4Adriamycin and Cytoxanand pembrolizumab every 3 weeks Labs reviewed  Chemo toxicities: 1.Mild nausea 2.chest congestion: U probably viral related 3. Profound fatigue 4.Anemia: Patient was anemic even prior to starting cycle 1 chemo. Iron studies reveal a ferritin of 122and iron saturation of 23%, no role of IV iron therapy.  Today's hemoglobin is 9.9.  Monitoring 6.  Lack of taste  CT Chest: 05/28/20: vague Rt hepatic lobe lesion 2.5 cm (MRI liver ordered), 3 mm Rt lung nodule, fatty liver, Left Thyroid nodule MRI of the liver and thyroid ultrasound were ordered Patient's mother has a lung mass and is getting a PET scan and she is worried about her. Return to clinic in 3 weeks to start [place] with pembrolizumab.  No orders of the defined types were placed in this encounter.  The patient has a good understanding of the overall plan. she agrees with it. she will call with any problems that may develop before the next visit here.  Total time spent: 30 mins including face to face time and time spent for planning, charting and coordination of care  Amanda Eisenmenger, MD, MPH 06/03/2020  I, Molly Dorshimer, am acting as scribe for Dr. Nicholas Burns.  I have reviewed the above documentation for accuracy and completeness, and I agree with the above.

## 2020-06-03 ENCOUNTER — Other Ambulatory Visit: Payer: Self-pay

## 2020-06-03 ENCOUNTER — Encounter: Payer: Self-pay | Admitting: *Deleted

## 2020-06-03 ENCOUNTER — Inpatient Hospital Stay: Payer: BC Managed Care – PPO | Attending: Hematology and Oncology

## 2020-06-03 ENCOUNTER — Inpatient Hospital Stay: Payer: BC Managed Care – PPO

## 2020-06-03 ENCOUNTER — Inpatient Hospital Stay: Payer: BC Managed Care – PPO | Admitting: Hematology and Oncology

## 2020-06-03 DIAGNOSIS — R5383 Other fatigue: Secondary | ICD-10-CM | POA: Diagnosis not present

## 2020-06-03 DIAGNOSIS — Z5189 Encounter for other specified aftercare: Secondary | ICD-10-CM | POA: Diagnosis not present

## 2020-06-03 DIAGNOSIS — Z803 Family history of malignant neoplasm of breast: Secondary | ICD-10-CM | POA: Insufficient documentation

## 2020-06-03 DIAGNOSIS — D241 Benign neoplasm of right breast: Secondary | ICD-10-CM | POA: Diagnosis not present

## 2020-06-03 DIAGNOSIS — Z5111 Encounter for antineoplastic chemotherapy: Secondary | ICD-10-CM | POA: Insufficient documentation

## 2020-06-03 DIAGNOSIS — D649 Anemia, unspecified: Secondary | ICD-10-CM | POA: Insufficient documentation

## 2020-06-03 DIAGNOSIS — Z79899 Other long term (current) drug therapy: Secondary | ICD-10-CM | POA: Diagnosis not present

## 2020-06-03 DIAGNOSIS — C50412 Malignant neoplasm of upper-outer quadrant of left female breast: Secondary | ICD-10-CM | POA: Insufficient documentation

## 2020-06-03 DIAGNOSIS — Z9013 Acquired absence of bilateral breasts and nipples: Secondary | ICD-10-CM | POA: Insufficient documentation

## 2020-06-03 DIAGNOSIS — E039 Hypothyroidism, unspecified: Secondary | ICD-10-CM | POA: Insufficient documentation

## 2020-06-03 DIAGNOSIS — Z171 Estrogen receptor negative status [ER-]: Secondary | ICD-10-CM | POA: Diagnosis not present

## 2020-06-03 DIAGNOSIS — Z5112 Encounter for antineoplastic immunotherapy: Secondary | ICD-10-CM | POA: Insufficient documentation

## 2020-06-03 LAB — CMP (CANCER CENTER ONLY)
ALT: 48 U/L — ABNORMAL HIGH (ref 0–44)
AST: 30 U/L (ref 15–41)
Albumin: 4 g/dL (ref 3.5–5.0)
Alkaline Phosphatase: 108 U/L (ref 38–126)
Anion gap: 12 (ref 5–15)
BUN: 9 mg/dL (ref 6–20)
CO2: 23 mmol/L (ref 22–32)
Calcium: 9 mg/dL (ref 8.9–10.3)
Chloride: 106 mmol/L (ref 98–111)
Creatinine: 0.81 mg/dL (ref 0.44–1.00)
GFR, Estimated: >60 mL/min (ref >60)
Glucose, Bld: 137 mg/dL — ABNORMAL HIGH (ref 70–99)
Potassium: 3.7 mmol/L (ref 3.5–5.1)
Sodium: 141 mmol/L (ref 135–145)
Total Bilirubin: <0.2 mg/dL — ABNORMAL LOW (ref 0.3–1.2)
Total Protein: 7.4 g/dL (ref 6.5–8.1)

## 2020-06-03 LAB — CBC WITH DIFFERENTIAL (CANCER CENTER ONLY)
Abs Immature Granulocytes: 0.01 10*3/uL (ref 0.00–0.07)
Basophils Absolute: 0.1 10*3/uL (ref 0.0–0.1)
Basophils Relative: 2 %
Eosinophils Absolute: 0.3 10*3/uL (ref 0.0–0.5)
Eosinophils Relative: 7 %
HCT: 31.7 % — ABNORMAL LOW (ref 36.0–46.0)
Hemoglobin: 9.9 g/dL — ABNORMAL LOW (ref 12.0–15.0)
Immature Granulocytes: 0 %
Lymphocytes Relative: 21 %
Lymphs Abs: 0.9 10*3/uL (ref 0.7–4.0)
MCH: 24.4 pg — ABNORMAL LOW (ref 26.0–34.0)
MCHC: 31.2 g/dL (ref 30.0–36.0)
MCV: 78.1 fL — ABNORMAL LOW (ref 80.0–100.0)
Monocytes Absolute: 0.4 10*3/uL (ref 0.1–1.0)
Monocytes Relative: 10 %
Neutro Abs: 2.7 10*3/uL (ref 1.7–7.7)
Neutrophils Relative %: 60 %
Platelet Count: 395 10*3/uL (ref 150–400)
RBC: 4.06 MIL/uL (ref 3.87–5.11)
RDW: 19.4 % — ABNORMAL HIGH (ref 11.5–15.5)
WBC Count: 4.5 10*3/uL (ref 4.0–10.5)
nRBC: 0 % (ref 0.0–0.2)

## 2020-06-03 LAB — TSH: TSH: 0.882 u[IU]/mL (ref 0.308–3.960)

## 2020-06-03 MED ORDER — PALONOSETRON HCL INJECTION 0.25 MG/5ML
INTRAVENOUS | Status: AC
  Filled 2020-06-03: qty 5

## 2020-06-03 MED ORDER — HEPARIN SOD (PORK) LOCK FLUSH 100 UNIT/ML IV SOLN
500.0000 [IU] | Freq: Once | INTRAVENOUS | Status: AC | PRN
  Administered 2020-06-03: 500 [IU]
  Filled 2020-06-03: qty 5

## 2020-06-03 MED ORDER — PALONOSETRON HCL INJECTION 0.25 MG/5ML
0.2500 mg | Freq: Once | INTRAVENOUS | Status: AC
  Administered 2020-06-03: 0.25 mg via INTRAVENOUS

## 2020-06-03 MED ORDER — FOSAPREPITANT DIMEGLUMINE INJECTION 150 MG
150.0000 mg | Freq: Once | INTRAVENOUS | Status: AC
  Administered 2020-06-03: 150 mg via INTRAVENOUS
  Filled 2020-06-03: qty 150

## 2020-06-03 MED ORDER — DEXAMETHASONE SODIUM PHOSPHATE 100 MG/10ML IJ SOLN
10.0000 mg | Freq: Once | INTRAMUSCULAR | Status: AC
  Administered 2020-06-03: 10 mg via INTRAVENOUS
  Filled 2020-06-03: qty 10

## 2020-06-03 MED ORDER — SODIUM CHLORIDE 0.9 % IV SOLN
Freq: Once | INTRAVENOUS | Status: AC
  Filled 2020-06-03: qty 250

## 2020-06-03 MED ORDER — SODIUM CHLORIDE 0.9 % IV SOLN
600.0000 mg/m2 | Freq: Once | INTRAVENOUS | Status: AC
  Administered 2020-06-03: 1120 mg via INTRAVENOUS
  Filled 2020-06-03: qty 56

## 2020-06-03 MED ORDER — DOXORUBICIN HCL CHEMO IV INJECTION 2 MG/ML
60.0000 mg/m2 | Freq: Once | INTRAVENOUS | Status: AC
  Administered 2020-06-03: 112 mg via INTRAVENOUS
  Filled 2020-06-03: qty 56

## 2020-06-03 MED ORDER — SODIUM CHLORIDE 0.9% FLUSH
10.0000 mL | INTRAVENOUS | Status: DC | PRN
  Administered 2020-06-03: 10 mL
  Filled 2020-06-03: qty 10

## 2020-06-03 MED ORDER — SODIUM CHLORIDE 0.9 % IV SOLN
200.0000 mg | Freq: Once | INTRAVENOUS | Status: AC
  Administered 2020-06-03: 200 mg via INTRAVENOUS
  Filled 2020-06-03: qty 8

## 2020-06-03 NOTE — Patient Instructions (Signed)
[facility] Discharge Instructions for Patients Receiving Chemotherapy  Today you received the following chemotherapy agents: pembrolizumab/doxorubicin/cyclophosphamide.  To help prevent nausea and vomiting after your treatment, we encourage you to take your nausea medication as directed.   If you develop nausea and vomiting that is not controlled by your nausea medication, call the clinic.   BELOW ARE SYMPTOMS THAT SHOULD BE REPORTED IMMEDIATELY:  *FEVER GREATER THAN 100.5 F  *CHILLS WITH OR WITHOUT FEVER  NAUSEA AND VOMITING THAT IS NOT CONTROLLED WITH YOUR NAUSEA MEDICATION  *UNUSUAL SHORTNESS OF BREATH  *UNUSUAL BRUISING OR BLEEDING  TENDERNESS IN MOUTH AND THROAT WITH OR WITHOUT PRESENCE OF ULCERS  *URINARY PROBLEMS  *BOWEL PROBLEMS  UNUSUAL RASH Items with * indicate a potential emergency and should be followed up as soon as possible.  Feel free to call the clinic should you have any questions or concerns. The clinic phone number is [phone].  Please show the [facility] at check-in to the Emergency Department and triage nurse.

## 2020-06-05 ENCOUNTER — Other Ambulatory Visit: Payer: Self-pay

## 2020-06-05 ENCOUNTER — Inpatient Hospital Stay: Payer: BC Managed Care – PPO

## 2020-06-05 VITALS — BP 125/81 | HR 88 | Temp 98.5°F | Resp 18

## 2020-06-05 DIAGNOSIS — C50412 Malignant neoplasm of upper-outer quadrant of left female breast: Secondary | ICD-10-CM

## 2020-06-05 DIAGNOSIS — Z5112 Encounter for antineoplastic immunotherapy: Secondary | ICD-10-CM | POA: Diagnosis not present

## 2020-06-05 DIAGNOSIS — Z171 Estrogen receptor negative status [ER-]: Secondary | ICD-10-CM

## 2020-06-05 MED ORDER — PEGFILGRASTIM-JMDB 6 MG/0.6ML ~~LOC~~ SOSY
6.0000 mg | PREFILLED_SYRINGE | Freq: Once | SUBCUTANEOUS | Status: AC
  Administered 2020-06-05: 6 mg via SUBCUTANEOUS

## 2020-06-05 MED ORDER — PEGFILGRASTIM-JMDB 6 MG/0.6ML ~~LOC~~ SOSY
PREFILLED_SYRINGE | SUBCUTANEOUS | Status: AC
  Filled 2020-06-05: qty 0.6

## 2020-06-05 NOTE — Patient Instructions (Signed)
Pegfilgrastim injection What is this medicine? PEGFILGRASTIM (PEG fil gra stim) is a long-acting granulocyte colony-stimulating factor that stimulates the growth of neutrophils, a type of white blood cell important in the body's fight against infection. It is used to reduce the incidence of fever and infection in patients with certain types of cancer who are receiving chemotherapy that affects the bone marrow, and to increase survival after being exposed to high doses of radiation. This medicine may be used for other purposes; ask your health care provider or pharmacist if you have questions. COMMON BRAND NAME(S): Fulphila, Neulasta, Nyvepria, UDENYCA, Ziextenzo What should I tell my health care provider before I take this medicine? They need to know if you have any of these conditions:  kidney disease  latex allergy  ongoing radiation therapy  sickle cell disease  skin reactions to acrylic adhesives (On-Body Injector only)  an unusual or allergic reaction to pegfilgrastim, filgrastim, other medicines, foods, dyes, or preservatives  pregnant or trying to get pregnant  breast-feeding How should I use this medicine? This medicine is for injection under the skin. If you get this medicine at home, you will be taught how to prepare and give the pre-filled syringe or how to use the On-body Injector. Refer to the patient Instructions for Use for detailed instructions. Use exactly as directed. Tell your healthcare provider immediately if you suspect that the On-body Injector may not have performed as intended or if you suspect the use of the On-body Injector resulted in a missed or partial dose. It is important that you put your used needles and syringes in a special sharps container. Do not put them in a trash can. If you do not have a sharps container, call your pharmacist or healthcare provider to get one. Talk to your pediatrician regarding the use of this medicine in children. While this drug  may be prescribed for selected conditions, precautions do apply. Overdosage: If you think you have taken too much of this medicine contact a poison control center or emergency room at once. NOTE: This medicine is only for you. Do not share this medicine with others. What if I miss a dose? It is important not to miss your dose. Call your doctor or health care professional if you miss your dose. If you miss a dose due to an On-body Injector failure or leakage, a new dose should be administered as soon as possible using a single prefilled syringe for manual use. What may interact with this medicine? Interactions have not been studied. This list may not describe all possible interactions. Give your health care provider a list of all the medicines, herbs, non-prescription drugs, or dietary supplements you use. Also tell them if you smoke, drink alcohol, or use illegal drugs. Some items may interact with your medicine. What should I watch for while using this medicine? Your condition will be monitored carefully while you are receiving this medicine. You may need blood work done while you are taking this medicine. Talk to your health care provider about your risk of cancer. You may be more at risk for certain types of cancer if you take this medicine. If you are going to need a MRI, CT scan, or other procedure, tell your doctor that you are using this medicine (On-Body Injector only). What side effects may I notice from receiving this medicine? Side effects that you should report to your doctor or health care professional as soon as possible:  allergic reactions (skin rash, itching or hives, swelling of   the face, lips, or tongue)  back pain  dizziness  fever  pain, redness, or irritation at site where injected  pinpoint red spots on the skin  red or dark-brown urine  shortness of breath or breathing problems  stomach or side pain, or pain at the shoulder  swelling  tiredness  trouble  passing urine or change in the amount of urine  unusual bruising or bleeding Side effects that usually do not require medical attention (report to your doctor or health care professional if they continue or are bothersome):  bone pain  muscle pain This list may not describe all possible side effects. Call your doctor for medical advice about side effects. You may report side effects to FDA at 1-800-FDA-1088. Where should I keep my medicine? Keep out of the reach of children. If you are using this medicine at home, you will be instructed on how to store it. Throw away any unused medicine after the expiration date on the label. NOTE: This sheet is a summary. It may not cover all possible information. If you have questions about this medicine, talk to your doctor, pharmacist, or health care provider.  2021 Elsevier/Gold Standard (2019-03-22 13:20:51)  

## 2020-06-09 ENCOUNTER — Ambulatory Visit (HOSPITAL_COMMUNITY): Admission: RE | Admit: 2020-06-09 | Payer: BC Managed Care – PPO | Source: Ambulatory Visit

## 2020-06-10 ENCOUNTER — Ambulatory Visit: Payer: BC Managed Care – PPO | Admitting: Hematology and Oncology

## 2020-06-10 ENCOUNTER — Ambulatory Visit: Payer: BC Managed Care – PPO

## 2020-06-10 ENCOUNTER — Other Ambulatory Visit: Payer: BC Managed Care – PPO

## 2020-06-10 ENCOUNTER — Encounter: Payer: Self-pay | Admitting: *Deleted

## 2020-06-18 ENCOUNTER — Ambulatory Visit: Payer: BC Managed Care – PPO

## 2020-06-18 ENCOUNTER — Other Ambulatory Visit: Payer: BC Managed Care – PPO

## 2020-06-18 ENCOUNTER — Ambulatory Visit: Payer: BC Managed Care – PPO | Admitting: Hematology and Oncology

## 2020-06-24 NOTE — Progress Notes (Signed)
Patient Care Team: Patient, [name] (Inactive) as PCP - General (General Practice) [name], RN as Oncology Nurse Navigator [name], RN as Oncology Nurse Navigator [name], MD as Consulting Physician (General Surgery) [name], MD as Consulting Physician (Hematology and Oncology) [name], MD as Consulting Physician (Radiation Oncology)  DIAGNOSIS:    ICD-10-CM   1. Malignant neoplasm of upper-outer quadrant of left breast in female, estrogen receptor negative ([place])  C50.412    Z17.1     SUMMARY OF ONCOLOGIC HISTORY: Oncology History  Malignant neoplasm of upper-outer quadrant of left breast in female, estrogen receptor negative ([place])  11/29/2019 Initial Diagnosis   Patient palpated an area of concern in the left breast. Diagnostic mammogram and US showed a 1.8cm mass in the upper outer right breast representing a fibroadenoma, and in the left breast, three adjacent masses at the 2-2:30 position, 1.7cm, 0.8cm, and 1.4cm, with several cysts and no axillary adenopathy. Biopsy showed a benign fibroadenoma in the right breast, and in the left breast, IDC at the 2:30 position, grade 3, HER-2 negative (1+), ER+ 0%, PR+ 0%, Ki67 80%.    12/04/2019 Cancer Staging   Staging form: Breast, AJCC 8th Edition - Clinical: Stage IB (cT1c, cN0, cM0, G3, ER-, PR-, HER2-) - Signed by [name], MD on 12/04/2019   12/21/2019 Genetic Testing   Negative genetic testing:  No pathogenic variants detected on the [facility] Multi-Cancer Panel. A variant of uncertain significance (VUS) was detected in the CDK4 gene called c.415C>T (p.Arg139*). The report date is 12/21/2019.  The Multi-Cancer Panel offered by [facility] includes sequencing and/or deletion duplication testing of the following 85 genes: AIP, ALK, APC, ATM, AXIN2,BAP1,  BARD1, BLM, BMPR1A, BRCA1, BRCA2, BRIP1, CASR, CDC73, CDH1, CDK4, CDKN1B, CDKN1C, CDKN2A (p14ARF), CDKN2A (p16INK4a), CEBPA, CHEK2, CTNNA1, DICER1,  DIS3L2, EGFR (c.2369C>T, p.Thr790Met variant only), EPCAM (Deletion/duplication testing only), FH, FLCN, GATA2, GPC3, GREM1 (Promoter region deletion/duplication testing only), HOXB13 (c.251G>A, p.Gly84Glu), HRAS, KIT, MAX, MEN1, MET, MITF (c.952G>A, p.Glu318Lys variant only), MLH1, MSH2, MSH3, MSH6, MUTYH, NBN, NF1, NF2, NTHL1, PALB2, PDGFRA, PHOX2B, PMS2, POLD1, POLE, POT1, PRKAR1A, PTCH1, PTEN, RAD50, RAD51C, RAD51D, RB1, RECQL4, RET, RNF43, RUNX1, SDHAF2, SDHA (sequence changes only), SDHB, SDHC, SDHD, SMAD4, SMARCA4, SMARCB1, SMARCE1, STK11, SUFU, TERC, TERT, TMEM127, TP53, TSC1, TSC2, VHL, WRN and WT1.    01/09/2020 Surgery   Bilateral mastectomies with reconstruction [facility]):  Right breast: no evidence of malignancy Left breast: multifocal invasive ductal carcinoma, grade 3, largest 2.2cm, with high grade DCIS, clear margins, 4 left axillary lymph nodes negative for carcinoma.   03/18/2020 -  Chemotherapy    Patient is on Treatment Plan: BREAST AC Q21D / CARBOPLATIN D1 + PACLITAXEL D1,8,15 Q21D        CHIEF COMPLIANT: Cycle 1 Taxol, carbo and pembrolizumab  INTERVAL HISTORY: BRYNA RAZAVI is a 40 y.o. with above-mentioned history of left breast cancerwhounderwent bilateral mastectomies with reconstruction. She completed 4 cycles of adjuvant chemotherapy with dose dense Adriamycin and Cytoxan, pembrolizumab.She presents to the clinic todayfor a toxicity checkandcycle 1 Taxol, carbo and pembrolizumab. Overall she is doing reasonably well.  Denies any nausea or vomiting.  Fatigue and aches and pains and stiffness.  ALLERGIES:  is allergic to adhesive [tape], benzoyl peroxide, and hydrocodone.  MEDICATIONS:  Current Outpatient Medications  Medication Sig Dispense Refill  . acetaminophen (TYLENOL) 500 MG tablet Take 1,000 mg by mouth every 6 (six) hours as needed for moderate pain or headache.    Marland Kitchen  Adapalene 0.3 % gel Apply 1 application topically at bedtime as needed  (acne).     Marland Kitchen amphetamine-dextroamphetamine (ADDERALL XR) 20 MG 24 hr capsule Take 20 mg by mouth daily.     Marland Kitchen bismuth subsalicylate (PEPTO BISMOL) 262 MG/15ML suspension Take 30 mLs by mouth every 6 (six) hours as needed for indigestion or diarrhea or loose stools.    Marland Kitchen buPROPion (WELLBUTRIN XL) 150 MG 24 hr tablet Take 150 mg by mouth daily.    Marland Kitchen escitalopram (LEXAPRO) 20 MG tablet Take 20 mg by mouth daily.    . fluticasone (FLONASE) 50 MCG/ACT nasal spray Place 1 spray into both nostrils daily as needed for allergies or rhinitis.    Marland Kitchen ibuprofen (ADVIL) 200 MG tablet Take 400 mg by mouth every 6 (six) hours as needed for headache or moderate pain.    . Ibuprofen-diphenhydrAMINE HCl (IBUPROFEN PM) 200-25 MG CAPS Take 2 tablets by mouth at bedtime as needed (sleep).    Marland Kitchen lidocaine-prilocaine (EMLA) cream Apply to affected area once (Patient taking differently: Apply 1 application topically daily as needed (port access). ) 30 g 3  . LORazepam (ATIVAN) 0.5 MG tablet Take 1 tablet (0.5 mg total) by mouth at bedtime as needed for sleep. 30 tablet 0  . Multiple Vitamins-Minerals (MULTIVITAMIN WITH MINERALS) tablet Take 1 tablet by mouth daily.    Marland Kitchen omeprazole (PRILOSEC) 20 MG capsule Take 20 mg by mouth daily.    . ondansetron (ZOFRAN) 8 MG tablet Take 1 tablet (8 mg total) by mouth 2 (two) times daily as needed. Start on the third day after chemotherapy. 30 tablet 1  . prochlorperazine (COMPAZINE) 10 MG tablet Take 1 tablet (10 mg total) by mouth every 6 (six) hours as needed (Nausea or vomiting). 30 tablet 1   No current facility-administered medications for this visit.   Facility-Administered Medications Ordered in Other Visits  Medication Dose Route Frequency Provider Last Rate Last Admin  . sodium chloride flush (NS) 0.9 % injection 10 mL  10 mL Intravenous PRN [name], MD   10 mL at 03/18/20 1033    PHYSICAL EXAMINATION: ECOG PERFORMANCE STATUS: 1 - Symptomatic but completely  ambulatory  Vitals:   06/25/20 1001  BP: 128/81  Pulse: 89  Resp: 18  Temp: (!) 97.5 F (36.4 C)  SpO2: 100%   Filed Weights   06/25/20 1001  Weight: 171 lb (77.6 kg)    LABORATORY DATA:  I have reviewed the data as listed CMP Latest Ref Rng & Units 06/03/2020 04/29/2020 04/09/2020  Glucose 70 - 99 mg/dL 137(H) 99 124(H)  BUN 6 - 20 mg/dL $Remove'9 9 9  'FNdvQlG$ Creatinine 0.44 - 1.00 mg/dL 0.81 0.77 0.79  Sodium 135 - 145 mmol/L 141 137 137  Potassium 3.5 - 5.1 mmol/L 3.7 3.8 3.9  Chloride 98 - 111 mmol/L 106 106 105  CO2 22 - 32 mmol/L $RemoveB'23 25 26  'oOwqoPov$ Calcium 8.9 - 10.3 mg/dL 9.0 8.1(L) 8.8(L)  Total Protein 6.5 - 8.1 g/dL 7.4 6.7 7.0  Total Bilirubin 0.3 - 1.2 mg/dL <0.2(L) <0.2(L) <0.2(L)  Alkaline Phos 38 - 126 U/L 108 108 137(H)  AST 15 - 41 U/L 30 18 32  ALT 0 - 44 U/L 48(H) 27 37    Lab Results  Component Value Date   WBC 3.4 (L) 06/25/2020   HGB 9.5 (L) 06/25/2020   HCT 29.5 (L) 06/25/2020   MCV 78.9 (L) 06/25/2020   PLT 458 (H) 06/25/2020   NEUTROABS 2.0 06/25/2020  ASSESSMENT & PLAN:  Malignant neoplasm of upper-outer quadrant of left breast in female, estrogen receptor negative (HCC) Palpable left breast mass: 1.8 cm UOQ fibroadenoma in the right breast, left breast 3 masses 2 to 2:30 position: 1.7, 1.8, 1.4 cm. Biopsy revealed IDC grade 3, ER 0%, PR 0%, Ki-67 80%, HER-2 -1+  T1c M0 stage IB  Treatment plan: 1.Bilateral mastectomies 01/09/2020: Right mastectomy:PASH Left mastectomy: Multifocal IDC grade 3 2.2 cm largest, high-grade DCIS, margins negative, 0/4 sentinel lymph nodes, ER negative, PR negative, HER-2 negative, Ki-67 80% 2.I recommended adjuvant chemotherapy with dose dense Adriamycin andCytoxanpembrolizumabfollowed by Taxol, carboplatin and pembrolizumab 3.Adjuvant radiation ----------------------------------------------------------------------------------------------------------------------------------- Current treatment: Completed 4 cycles of  Adriamycin and Cytoxanand pembrolizumab every 3 weeks, today cycle 1 Taxol with [place] and pembrolizumab.  She will get Taxol weekly. Labs reviewed  Chemo toxicities: 1.Mild nausea 2.chest congestion: Much improved 3.Profoundfatigue: We are hoping that the fatigue will improve since we are have completed Adriamycin and Cytoxan. 4.Anemia: Patient was anemic even prior to starting cycle 1 chemo. Iron studies reveal a ferritin of 122and iron saturation of 23%, no role of IV iron therapy.Today's hemoglobin is 9.5. Monitoring 6.Lack of taste  I would like to add carboplatin to the next weeks chemo because her cancer is triple negative and is considered to be high risk. Today she gets Taxol but pembrolizumab. CT Chest: 05/28/20: vague Rt hepatic lobe lesion 2.5 cm (MRI liver ordered), 3 mm Rt lung nodule, fatty liver, Left Thyroid nodule    Return to clinic in 1 week to see Sandi Mealy for toxicity evaluation on Taxol and carboplatin with pembrolizumab     No orders of the defined types were placed in this encounter.  The patient has a good understanding of the overall plan. she agrees with it. she will call with any problems that may develop before the next visit here.  Total time spent: 30 mins including face to face time and time spent for planning, charting and coordination of care  Rulon Eisenmenger, MD, MPH 06/25/2020  I, Molly Dorshimer, am acting as scribe for Dr. [name].  I have reviewed the above documentation for accuracy and completeness, and I agree with the above.

## 2020-06-24 NOTE — Assessment & Plan Note (Signed)
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 Biopsy revealed IDC grade 3, ER 0%, PR 0%, Ki-67 80%, HER-2 -1+  T1c M0 stage IB  Treatment plan: 1.Bilateral mastectomies 01/09/2020: Right mastectomy:PASH Left mastectomy: Multifocal IDC grade 3 2.2 cm largest, high-grade DCIS, margins negative, 0/4 sentinel lymph nodes, ER negative, PR negative, HER-2 negative, Ki-67 80% 2.I recommended adjuvant chemotherapy with dose dense Adriamycin andCytoxanpembrolizumabfollowed by Taxol, carboplatin and pembrolizumab 3.Adjuvant radiation ----------------------------------------------------------------------------------------------------------------------------------- Current treatment: Completed 4 cycles of Adriamycin and Cytoxanand pembrolizumab every 3 weeks, today cycle 1 Taxol with [place] and pembrolizumab.  She will get Taxol weekly. Labs reviewed  Chemo toxicities: 1.Mild nausea 2.chest congestion: Uprobably viral related 3.Profoundfatigue 4.Anemia: Patient was anemic even prior to starting cycle 1 chemo. Iron studies reveal a ferritin of 122and iron saturation of 23%, no role of IV iron therapy.Today's hemoglobin is 9.9. Monitoring 6.Lack of taste  CT Chest: 05/28/20: vague Rt hepatic lobe lesion 2.5 cm (MRI liver ordered), 3 mm Rt lung nodule, fatty liver, Left Thyroid nodule MRI of the liver and thyroid ultrasound were ordered Patient's mother has a lung mass and is getting a PET scan and she is worried about her.  Return to clinic in 1 week to see Sandi Mealy for toxicity evaluation on Taxol and carboplatin with pembrolizumab

## 2020-06-25 ENCOUNTER — Other Ambulatory Visit: Payer: Self-pay

## 2020-06-25 ENCOUNTER — Other Ambulatory Visit: Payer: BC Managed Care – PPO

## 2020-06-25 ENCOUNTER — Ambulatory Visit: Payer: BC Managed Care – PPO

## 2020-06-25 ENCOUNTER — Inpatient Hospital Stay: Payer: BC Managed Care – PPO

## 2020-06-25 ENCOUNTER — Inpatient Hospital Stay (HOSPITAL_BASED_OUTPATIENT_CLINIC_OR_DEPARTMENT_OTHER): Payer: BC Managed Care – PPO | Admitting: Hematology and Oncology

## 2020-06-25 ENCOUNTER — Inpatient Hospital Stay: Payer: BC Managed Care – PPO | Attending: Hematology and Oncology

## 2020-06-25 VITALS — BP 129/85 | HR 87 | Temp 98.4°F | Resp 18

## 2020-06-25 DIAGNOSIS — Z803 Family history of malignant neoplasm of breast: Secondary | ICD-10-CM | POA: Insufficient documentation

## 2020-06-25 DIAGNOSIS — R5383 Other fatigue: Secondary | ICD-10-CM | POA: Diagnosis not present

## 2020-06-25 DIAGNOSIS — D241 Benign neoplasm of right breast: Secondary | ICD-10-CM | POA: Diagnosis not present

## 2020-06-25 DIAGNOSIS — Z171 Estrogen receptor negative status [ER-]: Secondary | ICD-10-CM | POA: Diagnosis not present

## 2020-06-25 DIAGNOSIS — D649 Anemia, unspecified: Secondary | ICD-10-CM | POA: Diagnosis not present

## 2020-06-25 DIAGNOSIS — Z5112 Encounter for antineoplastic immunotherapy: Secondary | ICD-10-CM | POA: Insufficient documentation

## 2020-06-25 DIAGNOSIS — Z79899 Other long term (current) drug therapy: Secondary | ICD-10-CM | POA: Diagnosis not present

## 2020-06-25 DIAGNOSIS — Z95828 Presence of other vascular implants and grafts: Secondary | ICD-10-CM

## 2020-06-25 DIAGNOSIS — Z5111 Encounter for antineoplastic chemotherapy: Secondary | ICD-10-CM | POA: Insufficient documentation

## 2020-06-25 DIAGNOSIS — E039 Hypothyroidism, unspecified: Secondary | ICD-10-CM | POA: Diagnosis not present

## 2020-06-25 DIAGNOSIS — Z9013 Acquired absence of bilateral breasts and nipples: Secondary | ICD-10-CM | POA: Diagnosis not present

## 2020-06-25 DIAGNOSIS — C50412 Malignant neoplasm of upper-outer quadrant of left female breast: Secondary | ICD-10-CM

## 2020-06-25 LAB — CBC WITH DIFFERENTIAL (CANCER CENTER ONLY)
Abs Immature Granulocytes: 0.01 10*3/uL (ref 0.00–0.07)
Basophils Absolute: 0.1 10*3/uL (ref 0.0–0.1)
Basophils Relative: 2 %
Eosinophils Absolute: 0 10*3/uL (ref 0.0–0.5)
Eosinophils Relative: 1 %
HCT: 29.5 % — ABNORMAL LOW (ref 36.0–46.0)
Hemoglobin: 9.5 g/dL — ABNORMAL LOW (ref 12.0–15.0)
Immature Granulocytes: 0 %
Lymphocytes Relative: 21 %
Lymphs Abs: 0.7 10*3/uL (ref 0.7–4.0)
MCH: 25.4 pg — ABNORMAL LOW (ref 26.0–34.0)
MCHC: 32.2 g/dL (ref 30.0–36.0)
MCV: 78.9 fL — ABNORMAL LOW (ref 80.0–100.0)
Monocytes Absolute: 0.6 10*3/uL (ref 0.1–1.0)
Monocytes Relative: 17 %
Neutro Abs: 2 10*3/uL (ref 1.7–7.7)
Neutrophils Relative %: 59 %
Platelet Count: 458 10*3/uL — ABNORMAL HIGH (ref 150–400)
RBC: 3.74 MIL/uL — ABNORMAL LOW (ref 3.87–5.11)
RDW: 18.6 % — ABNORMAL HIGH (ref 11.5–15.5)
WBC Count: 3.4 10*3/uL — ABNORMAL LOW (ref 4.0–10.5)
nRBC: 0 % (ref 0.0–0.2)

## 2020-06-25 LAB — CMP (CANCER CENTER ONLY)
ALT: 48 U/L — ABNORMAL HIGH (ref 0–44)
AST: 36 U/L (ref 15–41)
Albumin: 4 g/dL (ref 3.5–5.0)
Alkaline Phosphatase: 109 U/L (ref 38–126)
Anion gap: 10 (ref 5–15)
BUN: 7 mg/dL (ref 6–20)
CO2: 25 mmol/L (ref 22–32)
Calcium: 9.2 mg/dL (ref 8.9–10.3)
Chloride: 105 mmol/L (ref 98–111)
Creatinine: 0.73 mg/dL (ref 0.44–1.00)
GFR, Estimated: >60 mL/min (ref >60)
Glucose, Bld: 99 mg/dL (ref 70–99)
Potassium: 4 mmol/L (ref 3.5–5.1)
Sodium: 140 mmol/L (ref 135–145)
Total Bilirubin: 0.2 mg/dL — ABNORMAL LOW (ref 0.3–1.2)
Total Protein: 7.2 g/dL (ref 6.5–8.1)

## 2020-06-25 LAB — TSH: TSH: 0.95 u[IU]/mL (ref 0.308–3.960)

## 2020-06-25 MED ORDER — ONDANSETRON HCL 4 MG/2ML IJ SOLN: 8.0000 mg | Freq: Once | INTRAMUSCULAR | Status: DC

## 2020-06-25 MED ORDER — DIPHENHYDRAMINE HCL 50 MG/ML IJ SOLN
INTRAMUSCULAR | Status: AC
  Filled 2020-06-25: qty 1

## 2020-06-25 MED ORDER — HEPARIN SOD (PORK) LOCK FLUSH 100 UNIT/ML IV SOLN
500.0000 [IU] | Freq: Once | INTRAVENOUS | Status: AC | PRN
  Administered 2020-06-25: 500 [IU]
  Filled 2020-06-25: qty 5

## 2020-06-25 MED ORDER — FAMOTIDINE IN NACL 20-0.9 MG/50ML-% IV SOLN
INTRAVENOUS | Status: AC
  Filled 2020-06-25: qty 50

## 2020-06-25 MED ORDER — SODIUM CHLORIDE 0.9% FLUSH
10.0000 mL | INTRAVENOUS | Status: DC | PRN
  Administered 2020-06-25: 10 mL
  Filled 2020-06-25: qty 10

## 2020-06-25 MED ORDER — SODIUM CHLORIDE 0.9 % IV SOLN
80.0000 mg/m2 | Freq: Once | INTRAVENOUS | Status: AC
  Administered 2020-06-25: 150 mg via INTRAVENOUS
  Filled 2020-06-25: qty 25

## 2020-06-25 MED ORDER — SODIUM CHLORIDE 0.9 % IV SOLN
Freq: Once | INTRAVENOUS | Status: AC
Start: 2020-06-25 — End: 2020-06-25
  Filled 2020-06-25: qty 250

## 2020-06-25 MED ORDER — SODIUM CHLORIDE 0.9 % IV SOLN
150.0000 mg | Freq: Once | INTRAVENOUS | Status: DC
  Filled 2020-06-25: qty 5

## 2020-06-25 MED ORDER — SODIUM CHLORIDE 0.9 % IV SOLN
200.0000 mg | Freq: Once | INTRAVENOUS | Status: AC
  Administered 2020-06-25: 200 mg via INTRAVENOUS
  Filled 2020-06-25: qty 8

## 2020-06-25 MED ORDER — PALONOSETRON HCL INJECTION 0.25 MG/5ML: 0.2500 mg | Freq: Once | INTRAVENOUS | Status: DC

## 2020-06-25 MED ORDER — FAMOTIDINE IN NACL 20-0.9 MG/50ML-% IV SOLN
20.0000 mg | Freq: Once | INTRAVENOUS | Status: AC
  Administered 2020-06-25: 20 mg via INTRAVENOUS

## 2020-06-25 MED ORDER — SODIUM CHLORIDE 0.9 % IV SOLN
10.0000 mg | Freq: Once | INTRAVENOUS | Status: AC
  Administered 2020-06-25: 10 mg via INTRAVENOUS
  Filled 2020-06-25: qty 10

## 2020-06-25 MED ORDER — DIPHENHYDRAMINE HCL 50 MG/ML IJ SOLN
50.0000 mg | Freq: Once | INTRAMUSCULAR | Status: AC
Start: 2020-06-25 — End: 2020-06-25
  Administered 2020-06-25: 50 mg via INTRAVENOUS

## 2020-06-25 MED ORDER — SODIUM CHLORIDE 0.9% FLUSH
10.0000 mL | Freq: Once | INTRAVENOUS | Status: AC
  Administered 2020-06-25: 10 mL
  Filled 2020-06-25: qty 10

## 2020-06-25 NOTE — Patient Instructions (Addendum)
[facility] Discharge Instructions for Patients Receiving Chemotherapy  Today you received the following chemotherapy agents: Keytruda/Taxol  To help prevent nausea and vomiting after your treatment, we encourage you to take your nausea medication as directed.   If you develop nausea and vomiting that is not controlled by your nausea medication, call the clinic.   BELOW ARE SYMPTOMS THAT SHOULD BE REPORTED IMMEDIATELY:  *FEVER GREATER THAN 100.5 F  *CHILLS WITH OR WITHOUT FEVER  NAUSEA AND VOMITING THAT IS NOT CONTROLLED WITH YOUR NAUSEA MEDICATION  *UNUSUAL SHORTNESS OF BREATH  *UNUSUAL BRUISING OR BLEEDING  TENDERNESS IN MOUTH AND THROAT WITH OR WITHOUT PRESENCE OF ULCERS  *URINARY PROBLEMS  *BOWEL PROBLEMS  UNUSUAL RASH Items with * indicate a potential emergency and should be followed up as soon as possible.  Feel free to call the clinic should you have any questions or concerns. The clinic phone number is [phone].  Please show the [place] at check-in to the Emergency Department and triage nurse.  Paclitaxel injection What is this medicine? PACLITAXEL (PAK li TAX el) is a chemotherapy drug. It targets fast dividing cells, like cancer cells, and causes these cells to die. This medicine is used to treat ovarian cancer, breast cancer, lung cancer, Kaposi's sarcoma, and other cancers. This medicine may be used for other purposes; ask your health care provider or pharmacist if you have questions. COMMON BRAND NAME(S): Onxol, Taxol What should I tell my health care provider before I take this medicine? They need to know if you have any of these conditions:  history of irregular heartbeat  liver disease  low blood counts, like low white cell, platelet, or red cell counts  lung or breathing disease, like asthma  tingling of the fingers or toes, or other nerve disorder  an unusual or allergic reaction to paclitaxel, alcohol,  polyoxyethylated castor oil, other chemotherapy, other medicines, foods, dyes, or preservatives  pregnant or trying to get pregnant  breast-feeding How should I use this medicine? This drug is given as an infusion into a vein. It is administered in a hospital or clinic by a specially trained health care professional. Talk to your pediatrician regarding the use of this medicine in children. Special care may be needed. Overdosage: If you think you have taken too much of this medicine contact a poison control center or emergency room at once. NOTE: This medicine is only for you. Do not share this medicine with others. What if I miss a dose? It is important not to miss your dose. Call your doctor or health care professional if you are unable to keep an appointment. What may interact with this medicine? Do not take this medicine with any of the following medications:  live virus vaccines This medicine may also interact with the following medications:  antiviral medicines for hepatitis, HIV or AIDS  certain antibiotics like erythromycin and clarithromycin  certain medicines for fungal infections like ketoconazole and itraconazole  certain medicines for seizures like carbamazepine, phenobarbital, phenytoin  gemfibrozil  nefazodone  rifampin  St. John's wort This list may not describe all possible interactions. Give your health care provider a list of all the medicines, herbs, non-prescription drugs, or dietary supplements you use. Also tell them if you smoke, drink alcohol, or use illegal drugs. Some items may interact with your medicine. What should I watch for while using this medicine? Your condition will be monitored carefully while you are receiving this medicine. You will need important blood work  done while you are taking this medicine. This medicine can cause serious allergic reactions. To reduce your risk you will need to take other medicine(s) before treatment with this  medicine. If you experience allergic reactions like skin rash, itching or hives, swelling of the face, lips, or tongue, tell your doctor or health care professional right away. In some cases, you may be given additional medicines to help with side effects. Follow all directions for their use. This drug may make you feel generally unwell. This is not uncommon, as chemotherapy can affect healthy cells as well as cancer cells. Report any side effects. Continue your course of treatment even though you feel ill unless your doctor tells you to stop. Call your doctor or health care professional for advice if you get a fever, chills or sore throat, or other symptoms of a cold or flu. Do not treat yourself. This drug decreases your body's ability to fight infections. Try to avoid being around people who are sick. This medicine may increase your risk to bruise or bleed. Call your doctor or health care professional if you notice any unusual bleeding. Be careful brushing and flossing your teeth or using a toothpick because you may get an infection or bleed more easily. If you have any dental work done, tell your dentist you are receiving this medicine. Avoid taking products that contain aspirin, acetaminophen, ibuprofen, naproxen, or ketoprofen unless instructed by your doctor. These medicines may hide a fever. Do not become pregnant while taking this medicine. Women should inform their doctor if they wish to become pregnant or think they might be pregnant. There is a potential for serious side effects to an unborn child. Talk to your health care professional or pharmacist for more information. Do not breast-feed an infant while taking this medicine. Men are advised not to father a child while receiving this medicine. This product may contain alcohol. Ask your pharmacist or healthcare provider if this medicine contains alcohol. Be sure to tell all healthcare providers you are taking this medicine. Certain medicines,  like metronidazole and disulfiram, can cause an unpleasant reaction when taken with alcohol. The reaction includes flushing, headache, nausea, vomiting, sweating, and increased thirst. The reaction can last from 30 minutes to several hours. What side effects may I notice from receiving this medicine? Side effects that you should report to your doctor or health care professional as soon as possible:  allergic reactions like skin rash, itching or hives, swelling of the face, lips, or tongue  breathing problems  changes in vision  fast, irregular heartbeat  high or low blood pressure  mouth sores  pain, tingling, numbness in the hands or feet  signs of decreased platelets or bleeding - bruising, pinpoint red spots on the skin, black, tarry stools, blood in the urine  signs of decreased red blood cells - unusually weak or tired, feeling faint or lightheaded, falls  signs of infection - fever or chills, cough, sore throat, pain or difficulty passing urine  signs and symptoms of liver injury like dark yellow or brown urine; general ill feeling or flu-like symptoms; light-colored stools; loss of appetite; nausea; right upper belly pain; unusually weak or tired; yellowing of the eyes or skin  swelling of the ankles, feet, hands  unusually slow heartbeat Side effects that usually do not require medical attention (report to your doctor or health care professional if they continue or are bothersome):  diarrhea  hair loss  loss of appetite  muscle or joint pain  nausea, vomiting  pain, redness, or irritation at site where injected  tiredness This list may not describe all possible side effects. Call your doctor for medical advice about side effects. You may report side effects to FDA at 1-800-FDA-1088. Where should I keep my medicine? This drug is given in a hospital or clinic and will not be stored at home. NOTE: This sheet is a summary. It may not cover all possible information.  If you have questions about this medicine, talk to your doctor, pharmacist, or health care provider.  2021 Elsevier/Gold Standard (2019-01-30 13:37:23)

## 2020-06-29 ENCOUNTER — Other Ambulatory Visit: Payer: Self-pay | Admitting: Oncology

## 2020-07-02 ENCOUNTER — Inpatient Hospital Stay (HOSPITAL_BASED_OUTPATIENT_CLINIC_OR_DEPARTMENT_OTHER): Payer: BC Managed Care – PPO | Admitting: Medical

## 2020-07-02 ENCOUNTER — Inpatient Hospital Stay: Payer: BC Managed Care – PPO

## 2020-07-02 ENCOUNTER — Encounter: Payer: Self-pay | Admitting: *Deleted

## 2020-07-02 ENCOUNTER — Other Ambulatory Visit: Payer: Self-pay

## 2020-07-02 ENCOUNTER — Other Ambulatory Visit: Payer: Self-pay | Admitting: Pharmacist

## 2020-07-02 ENCOUNTER — Other Ambulatory Visit: Payer: Self-pay | Admitting: Oncology

## 2020-07-02 ENCOUNTER — Other Ambulatory Visit: Payer: BC Managed Care – PPO

## 2020-07-02 VITALS — BP 117/83 | HR 86 | Temp 98.2°F | Resp 18 | Ht 61.0 in | Wt 173.0 lb

## 2020-07-02 DIAGNOSIS — Z171 Estrogen receptor negative status [ER-]: Secondary | ICD-10-CM

## 2020-07-02 DIAGNOSIS — C50412 Malignant neoplasm of upper-outer quadrant of left female breast: Secondary | ICD-10-CM | POA: Diagnosis not present

## 2020-07-02 DIAGNOSIS — Z95828 Presence of other vascular implants and grafts: Secondary | ICD-10-CM

## 2020-07-02 DIAGNOSIS — Z5112 Encounter for antineoplastic immunotherapy: Secondary | ICD-10-CM | POA: Diagnosis not present

## 2020-07-02 LAB — CBC WITH DIFFERENTIAL (CANCER CENTER ONLY)
Abs Immature Granulocytes: 0.02 10*3/uL (ref 0.00–0.07)
Basophils Absolute: 0.1 10*3/uL (ref 0.0–0.1)
Basophils Relative: 1 %
Eosinophils Absolute: 0.1 10*3/uL (ref 0.0–0.5)
Eosinophils Relative: 2 %
HCT: 30.3 % — ABNORMAL LOW (ref 36.0–46.0)
Hemoglobin: 9.5 g/dL — ABNORMAL LOW (ref 12.0–15.0)
Immature Granulocytes: 0 %
Lymphocytes Relative: 13 %
Lymphs Abs: 0.6 10*3/uL — ABNORMAL LOW (ref 0.7–4.0)
MCH: 25.5 pg — ABNORMAL LOW (ref 26.0–34.0)
MCHC: 31.4 g/dL (ref 30.0–36.0)
MCV: 81.2 fL (ref 80.0–100.0)
Monocytes Absolute: 0.3 10*3/uL (ref 0.1–1.0)
Monocytes Relative: 6 %
Neutro Abs: 3.6 10*3/uL (ref 1.7–7.7)
Neutrophils Relative %: 78 %
Platelet Count: 474 10*3/uL — ABNORMAL HIGH (ref 150–400)
RBC: 3.73 MIL/uL — ABNORMAL LOW (ref 3.87–5.11)
RDW: 17.7 % — ABNORMAL HIGH (ref 11.5–15.5)
WBC Count: 4.7 10*3/uL (ref 4.0–10.5)
nRBC: 0 % (ref 0.0–0.2)

## 2020-07-02 LAB — CMP (CANCER CENTER ONLY)
ALT: 69 U/L — ABNORMAL HIGH (ref 0–44)
AST: 37 U/L (ref 15–41)
Albumin: 3.9 g/dL (ref 3.5–5.0)
Alkaline Phosphatase: 94 U/L (ref 38–126)
Anion gap: 9 (ref 5–15)
BUN: 8 mg/dL (ref 6–20)
CO2: 27 mmol/L (ref 22–32)
Calcium: 9 mg/dL (ref 8.9–10.3)
Chloride: 105 mmol/L (ref 98–111)
Creatinine: 0.71 mg/dL (ref 0.44–1.00)
GFR, Estimated: >60 mL/min (ref >60)
Glucose, Bld: 122 mg/dL — ABNORMAL HIGH (ref 70–99)
Potassium: 3.8 mmol/L (ref 3.5–5.1)
Sodium: 141 mmol/L (ref 135–145)
Total Bilirubin: <0.2 mg/dL — ABNORMAL LOW (ref 0.3–1.2)
Total Protein: 7 g/dL (ref 6.5–8.1)

## 2020-07-02 LAB — TSH: TSH: 0.658 u[IU]/mL (ref 0.308–3.960)

## 2020-07-02 MED ORDER — FAMOTIDINE IN NACL 20-0.9 MG/50ML-% IV SOLN
20.0000 mg | Freq: Once | INTRAVENOUS | Status: AC
  Administered 2020-07-02: 20 mg via INTRAVENOUS

## 2020-07-02 MED ORDER — SODIUM CHLORIDE 0.9 % IV SOLN
10.0000 mg | Freq: Once | INTRAVENOUS | Status: AC
  Administered 2020-07-02: 10 mg via INTRAVENOUS
  Filled 2020-07-02: qty 10

## 2020-07-02 MED ORDER — DIPHENHYDRAMINE HCL 50 MG/ML IJ SOLN
50.0000 mg | Freq: Once | INTRAMUSCULAR | Status: AC
  Administered 2020-07-02: 50 mg via INTRAVENOUS

## 2020-07-02 MED ORDER — ALTEPLASE 2 MG IJ SOLR
INTRAMUSCULAR | Status: AC
  Filled 2020-07-02: qty 2

## 2020-07-02 MED ORDER — SODIUM CHLORIDE 0.9% FLUSH
10.0000 mL | Freq: Once | INTRAVENOUS | Status: AC
Start: 2020-07-02 — End: 2020-07-02
  Administered 2020-07-02: 10 mL
  Filled 2020-07-02: qty 10

## 2020-07-02 MED ORDER — SODIUM CHLORIDE 0.9 % IV SOLN
80.0000 mg/m2 | Freq: Once | INTRAVENOUS | Status: AC
  Administered 2020-07-02: 150 mg via INTRAVENOUS
  Filled 2020-07-02: qty 25

## 2020-07-02 MED ORDER — SODIUM CHLORIDE 0.9 % IV SOLN
720.0000 mg | Freq: Once | INTRAVENOUS | Status: AC
  Administered 2020-07-02: 720 mg via INTRAVENOUS
  Filled 2020-07-02: qty 72

## 2020-07-02 MED ORDER — FAMOTIDINE IN NACL 20-0.9 MG/50ML-% IV SOLN
INTRAVENOUS | Status: AC
  Filled 2020-07-02: qty 50

## 2020-07-02 MED ORDER — SODIUM CHLORIDE 0.9 % IV SOLN
Freq: Once | INTRAVENOUS | Status: AC
Start: 2020-07-02 — End: 2020-07-02
  Filled 2020-07-02: qty 250

## 2020-07-02 MED ORDER — SODIUM CHLORIDE 0.9 % IV SOLN
150.0000 mg | Freq: Once | INTRAVENOUS | Status: AC
  Administered 2020-07-02: 150 mg via INTRAVENOUS
  Filled 2020-07-02: qty 150

## 2020-07-02 MED ORDER — HEPARIN SOD (PORK) LOCK FLUSH 100 UNIT/ML IV SOLN
500.0000 [IU] | Freq: Once | INTRAVENOUS | Status: AC | PRN
  Administered 2020-07-02: 500 [IU]
  Filled 2020-07-02: qty 5

## 2020-07-02 MED ORDER — PALONOSETRON HCL INJECTION 0.25 MG/5ML
0.2500 mg | Freq: Once | INTRAVENOUS | Status: AC
  Administered 2020-07-02: 0.25 mg via INTRAVENOUS

## 2020-07-02 MED ORDER — DIPHENHYDRAMINE HCL 50 MG/ML IJ SOLN
INTRAMUSCULAR | Status: AC
  Filled 2020-07-02: qty 1

## 2020-07-02 MED ORDER — ALTEPLASE 2 MG IJ SOLR
2.0000 mg | Freq: Once | INTRAMUSCULAR | Status: AC
  Administered 2020-07-02: 2 mg
  Filled 2020-07-02: qty 2

## 2020-07-02 MED ORDER — SODIUM CHLORIDE 0.9% FLUSH
10.0000 mL | INTRAVENOUS | Status: DC | PRN
  Administered 2020-07-02: 10 mL
  Filled 2020-07-02: qty 10

## 2020-07-02 MED ORDER — PALONOSETRON HCL INJECTION 0.25 MG/5ML
INTRAVENOUS | Status: AC
  Filled 2020-07-02: qty 5

## 2020-07-02 NOTE — Progress Notes (Signed)
I reviewed Dr. Arnoldo Lenis note stating that he is adding carboplatin to this patient's treatment given its profile.  I have signed the carboplatin orders as written.

## 2020-07-02 NOTE — Patient Instructions (Signed)
Implanted Port Insertion, Care After This sheet gives you information about how to care for yourself after your procedure. Your health care provider may also give you more specific instructions. If you have problems or questions, contact your health care provider. What can I expect after the procedure? After the procedure, it is common to have:  Discomfort at the port insertion site.  Bruising on the skin over the port. This should improve over 3-4 days. Follow these instructions at home: Port care  After your port is placed, you will get a manufacturer's information card. The card has information about your port. Keep this card with you at all times.  Take care of the port as told by your health care provider. Ask your health care provider if you or a family member can get training for taking care of the port at home. A home health care nurse may also take care of the port.  Make sure to remember what type of port you have. Incision care  Follow instructions from your health care provider about how to take care of your port insertion site. Make sure you: ? Wash your hands with soap and water before and after you change your bandage (dressing). If soap and water are not available, use hand sanitizer. ? Change your dressing as told by your health care provider. ? Leave stitches (sutures), skin glue, or adhesive strips in place. These skin closures may need to stay in place for 2 weeks or longer. If adhesive strip edges start to loosen and curl up, you may trim the loose edges. Do not remove adhesive strips completely unless your health care provider tells you to do that.  Check your port insertion site every day for signs of infection. Check for: ? Redness, swelling, or pain. ? Fluid or blood. ? Warmth. ? Pus or a bad smell.      Activity  Return to your normal activities as told by your health care provider. Ask your health care provider what activities are safe for you.  Do not  lift anything that is heavier than 10 lb (4.5 kg), or the limit that you are told, until your health care provider says that it is safe. General instructions  Take over-the-counter and prescription medicines only as told by your health care provider.  Do not take baths, swim, or use a hot tub until your health care provider approves. Ask your health care provider if you may take showers. You may only be allowed to take sponge baths.  Do not drive for 24 hours if you were given a sedative during your procedure.  Wear a medical alert bracelet in case of an emergency. This will tell any health care providers that you have a port.  Keep all follow-up visits as told by your health care provider. This is important. Contact a health care provider if:  You cannot flush your port with saline as directed, or you cannot draw blood from the port.  You have a fever or chills.  You have redness, swelling, or pain around your port insertion site.  You have fluid or blood coming from your port insertion site.  Your port insertion site feels warm to the touch.  You have pus or a bad smell coming from the port insertion site. Get help right away if:  You have chest pain or shortness of breath.  You have bleeding from your port that you cannot control. Summary  Take care of the port as told by your   health care provider. Keep the manufacturer's information card with you at all times.  Change your dressing as told by your health care provider.  Contact a health care provider if you have a fever or chills or if you have redness, swelling, or pain around your port insertion site.  Keep all follow-up visits as told by your health care provider. This information is not intended to replace advice given to you by your health care provider. Make sure you discuss any questions you have with your health care provider. Document Revised: 09/26/2017 Document Reviewed: 09/26/2017 Elsevier Patient Education   2021 Elsevier Inc.  

## 2020-07-02 NOTE — Patient Instructions (Signed)
[facility] Discharge Instructions for Patients Receiving Chemotherapy  Today you received the following chemotherapy agents Taxol, Carboplatin  To help prevent nausea and vomiting after your treatment, we encourage you to take your nausea medication as directed  If you develop nausea and vomiting that is not controlled by your nausea medication, call the clinic.   BELOW ARE SYMPTOMS THAT SHOULD BE REPORTED IMMEDIATELY:  *FEVER GREATER THAN 100.5 F  *CHILLS WITH OR WITHOUT FEVER  NAUSEA AND VOMITING THAT IS NOT CONTROLLED WITH YOUR NAUSEA MEDICATION  *UNUSUAL SHORTNESS OF BREATH  *UNUSUAL BRUISING OR BLEEDING  TENDERNESS IN MOUTH AND THROAT WITH OR WITHOUT PRESENCE OF ULCERS  *URINARY PROBLEMS  *BOWEL PROBLEMS  UNUSUAL RASH Items with * indicate a potential emergency and should be followed up as soon as possible.  Feel free to call the clinic should you have any questions or concerns. The clinic phone number is [phone].  Please show the CHEMO ALERT CARD at check-in to the Emergency Department and triage nurse.   

## 2020-07-02 NOTE — Progress Notes (Signed)
[facility] Progress Note   Amanda Burns [id number] [birth year] 40 y.o.  Amanda Burns is managed by Dr. [name]  Actively treated with chemotherapy/immunotherapy/hormonal therapy: yes  Current therapy: carboplatin and Taxol  Last treated: 06/25/2020 (cycle 5, day 1)  Next scheduled appointment with provider: 07/09/2020  Assessment: Plan:    Malignant neoplasm of upper-outer quadrant of left breast in female, estrogen receptor negative ([place])   ER negative malignant neoplasm of the left breast: Ms. [name] presents to the clinic today for consideration of cycle 5, day 8 of carboplatin and paclitaxel.  We will proceed with her treatment today and will have her return for follow-up on 07/09/2020.  Please see After Visit Summary for patient specific instructions.  Future Appointments  Date Time Provider [place]  07/09/2020  9:00 AM [facility] None  07/09/2020  9:30 AM Sandi Mealy E., PA-C [facility] None  07/09/2020 10:00 AM [facility] INFUSION [facility] None  07/16/2020 12:00 PM [facility] None  07/16/2020 12:30 PM [name], MD [facility] None  07/16/2020  1:00 PM [facility] INFUSION [facility] None  07/23/2020  9:00 AM [facility] FLUSH [facility] None  07/23/2020 10:00 AM [facility] INFUSION [facility] None  07/30/2020 10:00 AM [facility] None  07/30/2020 10:30 AM [place], Charlestine Massed, NP [facility] None  07/30/2020 11:00 AM [facility] INFUSION [facility] None  08/06/2020  9:15 AM [facility] [place] FLUSH [facility] None  08/06/2020 10:15 AM [facility] INFUSION [facility] None  08/13/2020  9:45 AM [facility] INFUSION [facility] None  08/13/2020 10:15 AM [name], MD [facility] None  08/13/2020 11:00 AM [facility] INFUSION [facility] None  08/20/2020  9:15 AM [facility] [place] FLUSH [facility] None  08/20/2020 10:15 AM [facility] INFUSION [facility] None    No orders of the defined types  were placed in this encounter.      Subjective:   Patient ID:  Amanda Burns is a 40 y.o. (DOB [birth year]) female.  Chief Complaint: No chief complaint on file.   HPI Amanda Burns  is a 40 y.o. female with a diagnosis of an ER negative malignant neoplasm of the left breast.  She is followed by Dr. [name] and presents to clinic today for consideration of cycle 4, day 8 of carboplatin and paclitaxel.  She was to have an MRI of her liver completed which is being held until she has tissues expanders removed.  She reports that she has some constipation but otherwise is doing well.  She denies fevers, chills, sweats, nausea, vomiting, and diarrhea.  Her appetite is good.   Medications: I have reviewed the patient's current medications.  Allergies:  Allergies  Allergen Reactions  . Adhesive [Tape] Other (See Comments)    Patient states that the steri-strips used on her breasts after her biopsies "tore her skin".  . Benzoyl Peroxide Itching and Swelling  . Hydrocodone Itching    Past Medical History:  Diagnosis Date  . Anxiety   . Breast cancer ([place]) 2021  . Depression   . Family history of adrenal cancer   . Family history of breast cancer   . Family history of colon cancer   . Family history of throat cancer   . GERD (gastroesophageal reflux disease) 2015  . History of kidney stones 2006  . Postpartum care following vaginal delivery (3/28) 06/08/2013    Past Surgical History:  Procedure Laterality Date  . BREAST RECONSTRUCTION WITH PLACEMENT OF TISSUE EXPANDER AND FLEX HD (ACELLULAR HYDRATED DERMIS) Bilateral 01/09/2020  Procedure: BREAST RECONSTRUCTION WITH PLACEMENT OF TISSUE EXPANDER AND FLEX HD (ACELLULAR HYDRATED DERMIS);  Surgeon: Irene Limbo, MD;  Location: [facility];  Service: Plastics;  Laterality: Bilateral;  . FOOT SURGERY    . MASTECTOMY W/ SENTINEL NODE BIOPSY Bilateral 01/09/2020   Procedure: BILATERAL MASTECTOMY WITH LEFT SENTINEL LYMPH NODE BIOPSY;   Surgeon: [name], MD;  Location: [facility];  Service: General;  Laterality: Bilateral;  PECTORAL BLOCK, RNFA  . PORTACATH PLACEMENT Right 01/09/2020   Procedure: INSERTION PORT-A-CATH;  Surgeon: [name], MD;  Location: [facility];  Service: General;  Laterality: Right;    Family History  Problem Relation Age of Onset  . Breast cancer Other        maternal great-aunt (MGF's sister), dx in her 63s  . Breast cancer Maternal Grandmother 82  . Colon cancer Maternal Grandmother 29  . Cancer Maternal Grandmother 63       Adrenal cancer  . Breast cancer Paternal Aunt        dx. in her 83s  . Throat cancer Paternal Aunt        dx. in her 70s, non-smoker    Social History   Socioeconomic History  . Marital status: Single    Spouse name: Not on file  . Number of children: Not on file  . Years of education: Not on file  . Highest education level: Not on file  Occupational History  . Not on file  Tobacco Use  . Smoking status: Never Smoker  . Smokeless tobacco: Never Used  Vaping Use  . Vaping Use: Never used  Substance and Sexual Activity  . Alcohol use: No  . Drug use: No  . Sexual activity: Yes    Birth control/protection: None  Other Topics Concern  . Not on file  Social History Narrative  . Not on file   Social Determinants of Health   Financial Resource Strain: Not on file  Food Insecurity: Not on file  Transportation Needs: Not on file  Physical Activity: Not on file  Stress: Not on file  Social Connections: Not on file  Intimate Partner Violence: Not on file    Past Medical History, Surgical history, Social history, and Family history were reviewed and updated as appropriate.   Please see review of systems for further details on the patient's review from today.   Review of Systems:  Review of Systems  Constitutional: Negative for appetite change, chills, diaphoresis, fever and unexpected weight change.  HENT: Negative for trouble swallowing and voice  change.   Respiratory: Negative for cough, chest tightness, shortness of breath and wheezing.   Cardiovascular: Negative for chest pain and palpitations.  Gastrointestinal: Positive for constipation. Negative for abdominal distention, abdominal pain, anal bleeding, blood in stool, diarrhea, nausea, rectal pain and vomiting.  Musculoskeletal: Negative for back pain and myalgias.  Neurological: Negative for dizziness, light-headedness and headaches.    Objective:   Physical Exam:  BP 117/83 (BP Location: Right Arm, Patient Position: Sitting)   Pulse 86   Temp 98.2 F (36.8 C) (Tympanic)   Resp 18   Ht 5\' 1"  (1.549 m)   Wt 173 lb (78.5 kg)   SpO2 100%   BMI 32.69 kg/m  ECOG: 0  Physical Exam Constitutional:      General: She is not in acute distress.    Appearance: She is not diaphoretic.  HENT:     Head: Normocephalic and atraumatic.  Eyes:     General: No scleral icterus.  Right eye: No discharge.        Left eye: No discharge.     Conjunctiva/sclera: Conjunctivae normal.  Cardiovascular:     Rate and Rhythm: Normal rate and regular rhythm.     Heart sounds: Normal heart sounds. No murmur heard. No friction rub. No gallop.   Pulmonary:     Effort: Pulmonary effort is normal. No respiratory distress.     Breath sounds: Normal breath sounds. No wheezing or rales.  Abdominal:     General: Bowel sounds are normal. There is no distension.     Palpations: Abdomen is soft. There is no mass.     Tenderness: There is no abdominal tenderness. There is no guarding or rebound.  Skin:    General: Skin is warm and dry.  Neurological:     Mental Status: She is alert.     Coordination: Coordination normal.     Gait: Gait normal.  Psychiatric:        Mood and Affect: Mood normal.        Behavior: Behavior normal.        Thought Content: Thought content normal.        Judgment: Judgment normal.     Lab Review:     Component Value Date/Time   NA 141 07/02/2020 1045   K  3.8 07/02/2020 1045   CL 105 07/02/2020 1045   CO2 27 07/02/2020 1045   GLUCOSE 122 (H) 07/02/2020 1045   BUN 8 07/02/2020 1045   CREATININE 0.71 07/02/2020 1045   CALCIUM 9.0 07/02/2020 1045   PROT 7.0 07/02/2020 1045   ALBUMIN 3.9 07/02/2020 1045   AST 37 07/02/2020 1045   ALT 69 (H) 07/02/2020 1045   ALKPHOS 94 07/02/2020 1045   BILITOT <0.2 (L) 07/02/2020 1045   GFRNONAA >60 07/02/2020 1045   GFRAA >60 12/04/2019 0835       Component Value Date/Time   WBC 4.7 07/02/2020 1045   WBC 7.3 01/03/2020 0914   RBC 3.73 (L) 07/02/2020 1045   HGB 9.5 (L) 07/02/2020 1045   HCT 30.3 (L) 07/02/2020 1045   PLT 474 (H) 07/02/2020 1045   MCV 81.2 07/02/2020 1045   MCH 25.5 (L) 07/02/2020 1045   MCHC 31.4 07/02/2020 1045   RDW 17.7 (H) 07/02/2020 1045   LYMPHSABS 0.6 (L) 07/02/2020 1045   MONOABS 0.3 07/02/2020 1045   EOSABS 0.1 07/02/2020 1045   BASOSABS 0.1 07/02/2020 1045   -------------------------------  Imaging from last 24 hours (if applicable):  Radiology interpretation: No results found.    OK to treat.  Sandi Mealy, MHS, PA-C

## 2020-07-02 NOTE — Progress Notes (Signed)
Unable to get blood return from pt's port. Pt was given cathflo at 1026 by Abigail Butts, RN. Pt sent to lab for peripheral lab draw.

## 2020-07-03 ENCOUNTER — Telehealth: Payer: Self-pay | Admitting: Hematology and Oncology

## 2020-07-03 NOTE — Telephone Encounter (Signed)
Scheduled appts per 4/21 sch msg. Pt aware.  

## 2020-07-06 ENCOUNTER — Encounter: Payer: Self-pay | Admitting: *Deleted

## 2020-07-09 ENCOUNTER — Inpatient Hospital Stay: Payer: BC Managed Care – PPO

## 2020-07-09 ENCOUNTER — Other Ambulatory Visit: Payer: BC Managed Care – PPO

## 2020-07-09 ENCOUNTER — Other Ambulatory Visit: Payer: Self-pay

## 2020-07-09 ENCOUNTER — Inpatient Hospital Stay (HOSPITAL_BASED_OUTPATIENT_CLINIC_OR_DEPARTMENT_OTHER): Payer: BC Managed Care – PPO | Admitting: Medical

## 2020-07-09 VITALS — BP 110/84 | HR 100 | Temp 99.0°F | Resp 17 | Ht 61.0 in | Wt 165.8 lb

## 2020-07-09 DIAGNOSIS — Z171 Estrogen receptor negative status [ER-]: Secondary | ICD-10-CM | POA: Diagnosis not present

## 2020-07-09 DIAGNOSIS — C50412 Malignant neoplasm of upper-outer quadrant of left female breast: Secondary | ICD-10-CM

## 2020-07-09 DIAGNOSIS — Z95828 Presence of other vascular implants and grafts: Secondary | ICD-10-CM

## 2020-07-09 DIAGNOSIS — Z5112 Encounter for antineoplastic immunotherapy: Secondary | ICD-10-CM | POA: Diagnosis not present

## 2020-07-09 LAB — CMP (CANCER CENTER ONLY)
ALT: 111 U/L — ABNORMAL HIGH (ref 0–44)
AST: 61 U/L — ABNORMAL HIGH (ref 15–41)
Albumin: 3.7 g/dL (ref 3.5–5.0)
Alkaline Phosphatase: 136 U/L — ABNORMAL HIGH (ref 38–126)
Anion gap: 10 (ref 5–15)
BUN: 6 mg/dL (ref 6–20)
CO2: 25 mmol/L (ref 22–32)
Calcium: 9.1 mg/dL (ref 8.9–10.3)
Chloride: 105 mmol/L (ref 98–111)
Creatinine: 0.73 mg/dL (ref 0.44–1.00)
GFR, Estimated: >60 mL/min (ref >60)
Glucose, Bld: 118 mg/dL — ABNORMAL HIGH (ref 70–99)
Potassium: 3.4 mmol/L — ABNORMAL LOW (ref 3.5–5.1)
Sodium: 140 mmol/L (ref 135–145)
Total Bilirubin: <0.2 mg/dL — ABNORMAL LOW (ref 0.3–1.2)
Total Protein: 7.1 g/dL (ref 6.5–8.1)

## 2020-07-09 LAB — CBC WITH DIFFERENTIAL (CANCER CENTER ONLY)
Abs Immature Granulocytes: 0.01 10*3/uL (ref 0.00–0.07)
Basophils Absolute: 0 10*3/uL (ref 0.0–0.1)
Basophils Relative: 1 %
Eosinophils Absolute: 0.1 10*3/uL (ref 0.0–0.5)
Eosinophils Relative: 3 %
HCT: 28.2 % — ABNORMAL LOW (ref 36.0–46.0)
Hemoglobin: 9.3 g/dL — ABNORMAL LOW (ref 12.0–15.0)
Immature Granulocytes: 0 %
Lymphocytes Relative: 25 %
Lymphs Abs: 0.6 10*3/uL — ABNORMAL LOW (ref 0.7–4.0)
MCH: 25.8 pg — ABNORMAL LOW (ref 26.0–34.0)
MCHC: 33 g/dL (ref 30.0–36.0)
MCV: 78.1 fL — ABNORMAL LOW (ref 80.0–100.0)
Monocytes Absolute: 0.2 10*3/uL (ref 0.1–1.0)
Monocytes Relative: 6 %
Neutro Abs: 1.7 10*3/uL (ref 1.7–7.7)
Neutrophils Relative %: 65 %
Platelet Count: 277 10*3/uL (ref 150–400)
RBC: 3.61 MIL/uL — ABNORMAL LOW (ref 3.87–5.11)
RDW: 16.8 % — ABNORMAL HIGH (ref 11.5–15.5)
WBC Count: 2.6 10*3/uL — ABNORMAL LOW (ref 4.0–10.5)
nRBC: 0 % (ref 0.0–0.2)

## 2020-07-09 LAB — TSH: TSH: 0.752 u[IU]/mL (ref 0.308–3.960)

## 2020-07-09 MED ORDER — FAMOTIDINE IN NACL 20-0.9 MG/50ML-% IV SOLN
INTRAVENOUS | Status: AC
  Filled 2020-07-09: qty 50

## 2020-07-09 MED ORDER — SODIUM CHLORIDE 0.9 % IV SOLN
80.0000 mg/m2 | Freq: Once | INTRAVENOUS | Status: AC
  Administered 2020-07-09: 150 mg via INTRAVENOUS
  Filled 2020-07-09: qty 25

## 2020-07-09 MED ORDER — SODIUM CHLORIDE 0.9% FLUSH
10.0000 mL | INTRAVENOUS | Status: DC | PRN
  Administered 2020-07-09 (×2): 10 mL
  Filled 2020-07-09: qty 10

## 2020-07-09 MED ORDER — SODIUM CHLORIDE 0.9% FLUSH
10.0000 mL | Freq: Once | INTRAVENOUS | Status: AC
  Administered 2020-07-09: 10 mL
  Filled 2020-07-09: qty 10

## 2020-07-09 MED ORDER — HEPARIN SOD (PORK) LOCK FLUSH 100 UNIT/ML IV SOLN
500.0000 [IU] | Freq: Once | INTRAVENOUS | Status: AC | PRN
  Administered 2020-07-09: 500 [IU]
  Filled 2020-07-09: qty 5

## 2020-07-09 MED ORDER — SODIUM CHLORIDE 0.9 % IV SOLN
Freq: Once | INTRAVENOUS | Status: AC
Start: 2020-07-09 — End: 2020-07-09
  Filled 2020-07-09: qty 250

## 2020-07-09 MED ORDER — HEPARIN SOD (PORK) LOCK FLUSH 100 UNIT/ML IV SOLN
250.0000 [IU] | Freq: Once | INTRAVENOUS | Status: AC | PRN
  Administered 2020-07-09: 500 [IU]
  Filled 2020-07-09: qty 5

## 2020-07-09 MED ORDER — DIPHENHYDRAMINE HCL 50 MG/ML IJ SOLN
50.0000 mg | Freq: Once | INTRAMUSCULAR | Status: AC
  Administered 2020-07-09: 50 mg via INTRAVENOUS

## 2020-07-09 MED ORDER — SODIUM CHLORIDE 0.9% FLUSH
3.0000 mL | INTRAVENOUS | Status: DC | PRN
  Filled 2020-07-09: qty 10

## 2020-07-09 MED ORDER — SODIUM CHLORIDE 0.9 % IV SOLN
10.0000 mg | Freq: Once | INTRAVENOUS | Status: AC
  Administered 2020-07-09: 10 mg via INTRAVENOUS
  Filled 2020-07-09: qty 10

## 2020-07-09 MED ORDER — DIPHENHYDRAMINE HCL 50 MG/ML IJ SOLN
INTRAMUSCULAR | Status: AC
  Filled 2020-07-09: qty 1

## 2020-07-09 MED ORDER — FAMOTIDINE IN NACL 20-0.9 MG/50ML-% IV SOLN
20.0000 mg | Freq: Once | INTRAVENOUS | Status: AC
  Administered 2020-07-09: 20 mg via INTRAVENOUS

## 2020-07-09 NOTE — Progress Notes (Signed)
[facility] Progress Note   Amanda Burns [id number] December 23, 1980 40 y.o.  Amanda Burns is managed by Dr. [name]  Actively treated with chemotherapy/immunotherapy/hormonal therapy: yes  Current therapy: carboplatin and Taxol  Last treated: 07/02/2020 (cycle 5, day 8)  Next scheduled appointment with provider: 07/16/2020  Assessment: Plan:    Malignant neoplasm of upper-outer quadrant of left breast in female, estrogen receptor negative ([place])   ER negative malignant neoplasm of the left breast: Amanda Burns presents to the clinic today for consideration of cycle 5, day 15 of carboplatin and paclitaxel.  We will proceed with her treatment today and will have her return for follow-up on 07/16/2020.  Please see After Visit Summary for patient specific instructions.  Future Appointments  Date Time Provider [place]  07/16/2020 12:00 PM [place] None  07/16/2020 12:30 PM [name], MD [facility] None  07/16/2020  1:00 PM [facility] INFUSION [facility] None  07/23/2020  9:00 AM [facility] [place] FLUSH [facility] None  07/23/2020 10:00 AM [facility] INFUSION [facility] None  07/30/2020 10:00 AM [facility] [place] FLUSH [facility] None  07/30/2020 10:30 AM Cause[place], Charlestine Massed, NP [facility] None  07/30/2020 11:00 AM [facility] INFUSION [facility] None  08/06/2020  9:15 AM [facility] [place] FLUSH [facility] None  08/06/2020 10:15 AM [facility] INFUSION [facility] None  08/13/2020  9:45 AM [facility] INFUSION [facility] None  08/13/2020 10:15 AM [name], MD [facility] None  08/13/2020 11:00 AM [facility] INFUSION [facility] None  08/20/2020  9:15 AM [facility] [place] FLUSH [facility] None  08/20/2020 10:15 AM [facility] INFUSION [facility] None    No orders of the defined types were placed in this encounter.      Subjective:   Patient ID:  Amanda Burns is a 40 y.o. (DOB 10/08/1980) female.  Chief Complaint: No chief complaint on  file.   HPI Amanda Burns  is a 40 y.o. female with a diagnosis of an ER negative malignant neoplasm of the left breast.  She is followed by Dr. [name] and presents to clinic today for consideration of cycle 5, day 15 of carboplatin and paclitaxel.  She reports that she has not felt well this week.  She has had some diarrhea and stomach upset as well without evidence nasal congestion and a sore throat.  She has not had a fever but reports that she is having some hot flashes.  She reports that her son is also sick with a cold.  She denies myalgias or arthralgias.  She denies fevers, chills, sweats, nausea or vomiting.  She reports that she called the office yesterday to inform our staff that she had been sick and to inquire about today's appointment.  She reports that she did not get a call back.  She would like to proceed with her treatment today given that she has had to take off from work.   Medications: I have reviewed the patient's current medications.  Allergies:  Allergies  Allergen Reactions  . Adhesive [Tape] Other (See Comments)    Patient states that the steri-strips used on her breasts after her biopsies "tore her skin".  . Benzoyl Peroxide Itching and Swelling  . Hydrocodone Itching    Past Medical History:  Diagnosis Date  . Anxiety   . Breast cancer ([place]) 2021  . Depression   . Family history of adrenal cancer   . Family history of breast cancer   . Family history of colon cancer   . Family history of throat cancer   .  GERD (gastroesophageal reflux disease) 2015  . History of kidney stones 2006  . Postpartum care following vaginal delivery (3/28) 06/08/2013    Past Surgical History:  Procedure Laterality Date  . BREAST RECONSTRUCTION WITH PLACEMENT OF TISSUE EXPANDER AND FLEX HD (ACELLULAR HYDRATED DERMIS) Bilateral 01/09/2020   Procedure: BREAST RECONSTRUCTION WITH PLACEMENT OF TISSUE EXPANDER AND FLEX HD (ACELLULAR HYDRATED DERMIS);  Surgeon: Irene Limbo,  MD;  Location: [place];  Service: Plastics;  Laterality: Bilateral;  . FOOT SURGERY    . MASTECTOMY W/ SENTINEL NODE BIOPSY Bilateral 01/09/2020   Procedure: BILATERAL MASTECTOMY WITH LEFT SENTINEL LYMPH NODE BIOPSY;  Surgeon: [name], MD;  Location: [facility];  Service: General;  Laterality: Bilateral;  PECTORAL BLOCK, RNFA  . PORTACATH PLACEMENT Right 01/09/2020   Procedure: INSERTION PORT-A-CATH;  Surgeon: [name], MD;  Location: [facility];  Service: General;  Laterality: Right;    Family History  Problem Relation Age of Onset  . Breast cancer Other        maternal great-aunt (MGF's sister), dx in her 45s  . Breast cancer Maternal Grandmother 25  . Colon cancer Maternal Grandmother 63  . Cancer Maternal Grandmother 42       Adrenal cancer  . Breast cancer Paternal Aunt        dx. in her 32s  . Throat cancer Paternal Aunt        dx. in her 74s, non-smoker    Social History   Socioeconomic History  . Marital status: Single    Spouse name: Not on file  . Number of children: Not on file  . Years of education: Not on file  . Highest education level: Not on file  Occupational History  . Not on file  Tobacco Use  . Smoking status: Never Smoker  . Smokeless tobacco: Never Used  Vaping Use  . Vaping Use: Never used  Substance and Sexual Activity  . Alcohol use: No  . Drug use: No  . Sexual activity: Yes    Birth control/protection: None  Other Topics Concern  . Not on file  Social History Narrative  . Not on file   Social Determinants of Health   Financial Resource Strain: Not on file  Food Insecurity: Not on file  Transportation Needs: Not on file  Physical Activity: Not on file  Stress: Not on file  Social Connections: Not on file  Intimate Partner Violence: Not on file    Past Medical History, Surgical history, Social history, and Family history were reviewed and updated as appropriate.   Please see review of systems for further details on the patient's  review from today.   Review of Systems:  Review of Systems  Constitutional: Negative for appetite change, chills, diaphoresis, fever and unexpected weight change.  HENT: Positive for congestion and sore throat. Negative for trouble swallowing and voice change.   Respiratory: Positive for cough. Negative for chest tightness, shortness of breath and wheezing.   Cardiovascular: Negative for chest pain and palpitations.  Gastrointestinal: Positive for diarrhea. Negative for abdominal distention, abdominal pain, anal bleeding, blood in stool, constipation, nausea, rectal pain and vomiting.  Musculoskeletal: Negative for back pain and myalgias.  Neurological: Negative for dizziness, light-headedness and headaches.    Objective:   Physical Exam:  There were no vitals taken for this visit. ECOG: 0  Physical Exam Constitutional:      General: She is not in acute distress.    Appearance: She is not diaphoretic.  HENT:  Head: Normocephalic and atraumatic.     Mouth/Throat:     Mouth: Mucous membranes are moist.     Pharynx: Posterior oropharyngeal erythema present.  Eyes:     General: No scleral icterus.       Right eye: No discharge.        Left eye: No discharge.     Conjunctiva/sclera: Conjunctivae normal.  Cardiovascular:     Rate and Rhythm: Normal rate and regular rhythm.     Heart sounds: Normal heart sounds. No murmur heard. No friction rub. No gallop.   Pulmonary:     Effort: Pulmonary effort is normal. No respiratory distress.     Breath sounds: Normal breath sounds. No wheezing or rales.  Abdominal:     General: Bowel sounds are normal. There is no distension.     Palpations: Abdomen is soft. There is no mass.     Tenderness: There is no abdominal tenderness. There is no guarding or rebound.  Skin:    General: Skin is warm and dry.  Neurological:     Mental Status: She is alert.     Coordination: Coordination normal.     Gait: Gait normal.  Psychiatric:         Mood and Affect: Mood normal.        Behavior: Behavior normal.        Thought Content: Thought content normal.        Judgment: Judgment normal.     Lab Review:     Component Value Date/Time   NA 141 07/02/2020 1045   K 3.8 07/02/2020 1045   CL 105 07/02/2020 1045   CO2 27 07/02/2020 1045   GLUCOSE 122 (H) 07/02/2020 1045   BUN 8 07/02/2020 1045   CREATININE 0.71 07/02/2020 1045   CALCIUM 9.0 07/02/2020 1045   PROT 7.0 07/02/2020 1045   ALBUMIN 3.9 07/02/2020 1045   AST 37 07/02/2020 1045   ALT 69 (H) 07/02/2020 1045   ALKPHOS 94 07/02/2020 1045   BILITOT <0.2 (L) 07/02/2020 1045   GFRNONAA >60 07/02/2020 1045   GFRAA >60 12/04/2019 0835       Component Value Date/Time   WBC 2.6 (L) 07/09/2020 0942   WBC 7.3 01/03/2020 0914   RBC 3.61 (L) 07/09/2020 0942   HGB 9.3 (L) 07/09/2020 0942   HCT 28.2 (L) 07/09/2020 0942   PLT 277 07/09/2020 0942   MCV 78.1 (L) 07/09/2020 0942   MCH 25.8 (L) 07/09/2020 0942   MCHC 33.0 07/09/2020 0942   RDW 16.8 (H) 07/09/2020 0942   LYMPHSABS 0.6 (L) 07/09/2020 0942   MONOABS 0.2 07/09/2020 0942   EOSABS 0.1 07/09/2020 0942   BASOSABS 0.0 07/09/2020 0942   -------------------------------  Imaging from last 24 hours (if applicable):  Radiology interpretation: No results found.    OK to treat.  Sandi Mealy, MHS, PA-C

## 2020-07-09 NOTE — Progress Notes (Signed)
Per Sandi Mealy, PA, ok to treat with ALT 111.

## 2020-07-09 NOTE — Progress Notes (Signed)
OK to treat pending chemistry panel.  Laruen Risser, MHS, PA-C Physician Assistant 

## 2020-07-15 NOTE — Assessment & Plan Note (Signed)
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 Biopsy revealed IDC grade 3, ER 0%, PR 0%, Ki-67 80%, HER-2 -1+  T1c M0 stage IB  Treatment plan: 1.Bilateral mastectomies 01/09/2020: Right mastectomy:PASH Left mastectomy: Multifocal IDC grade 3 2.2 cm largest, high-grade DCIS, margins negative, 0/4 sentinel lymph nodes, ER negative, PR negative, HER-2 negative, Ki-67 80% 2.I recommended adjuvant chemotherapy with dose dense Adriamycin andCytoxanpembrolizumabfollowed by Taxol, carboplatin and pembrolizumab 3.Adjuvant radiation ----------------------------------------------------------------------------------------------------------------------------------- Current treatment: Completed 4 cycles of Adriamycin and Cytoxanand pembrolizumab every 3 weeks, today cycle 1 Taxol with [place] and pembrolizumab.  She will get Taxol weekly. Labs reviewed  Chemo toxicities: 1.Mild nausea 2.chest congestion: Much improved 3.Profoundfatigue: We are hoping that the fatigue will improve since we are have completed Adriamycin and Cytoxan. 4.Anemia: Patient was anemic even prior to starting cycle 1 chemo. Iron studies reveal a ferritin of 122and iron saturation of 23%, no role of IV iron therapy.Today's hemoglobin is 9.5. Monitoring 6.Lack of taste  I would like to add carboplatin to the next weeks chemo because her cancer is triple negative and is considered to be high risk.

## 2020-07-15 NOTE — Progress Notes (Signed)
Patient Care Team: Patient, [name] (Inactive) as PCP - General (General Practice) Pershing Proud, RN as Oncology Nurse Navigator Donnelly Angelica, RN as Oncology Nurse Navigator Almond Lint, MD as Consulting Physician (General Surgery) Serena Croissant, MD as Consulting Physician (Hematology and Oncology) Antony Blackbird, MD as Consulting Physician (Radiation Oncology)  DIAGNOSIS:    ICD-10-CM   1. Malignant neoplasm of upper-outer quadrant of left breast in female, estrogen receptor negative (HCC)  C50.412    Z17.1     SUMMARY OF ONCOLOGIC HISTORY: Oncology History  Malignant neoplasm of upper-outer quadrant of left breast in female, estrogen receptor negative (HCC)  11/29/2019 Initial Diagnosis   Patient palpated an area of concern in the left breast. Diagnostic mammogram and US showed a 1.8cm mass in the upper outer right breast representing a fibroadenoma, and in the left breast, three adjacent masses at the 2-2:30 position, 1.7cm, 0.8cm, and 1.4cm, with several cysts and no axillary adenopathy. Biopsy showed a benign fibroadenoma in the right breast, and in the left breast, IDC at the 2:30 position, grade 3, HER-2 negative (1+), ER+ 0%, PR+ 0%, Ki67 80%.    12/04/2019 Cancer Staging   Staging form: Breast, AJCC 8th Edition - Clinical: Stage IB (cT1c, cN0, cM0, G3, ER-, PR-, HER2-) - Signed by Serena Croissant, MD on 12/04/2019   12/21/2019 Genetic Testing   Negative genetic testing:  No pathogenic variants detected on the [facility] Multi-Cancer Panel. A variant of uncertain significance (VUS) was detected in the CDK4 gene called c.415C>T (p.Arg139*). The report date is 12/21/2019.  The Multi-Cancer Panel offered by [facility] includes sequencing and/or deletion duplication testing of the following 85 genes: AIP, ALK, APC, ATM, AXIN2,BAP1,  BARD1, BLM, BMPR1A, BRCA1, BRCA2, BRIP1, CASR, CDC73, CDH1, CDK4, CDKN1B, CDKN1C, CDKN2A (p14ARF), CDKN2A (p16INK4a), CEBPA, CHEK2, CTNNA1, DICER1,  DIS3L2, EGFR (c.2369C>T, p.Thr790Met variant only), EPCAM (Deletion/duplication testing only), FH, FLCN, GATA2, GPC3, GREM1 (Promoter region deletion/duplication testing only), HOXB13 (c.251G>A, p.Gly84Glu), HRAS, KIT, MAX, MEN1, MET, MITF (c.952G>A, p.Glu318Lys variant only), MLH1, MSH2, MSH3, MSH6, MUTYH, NBN, NF1, NF2, NTHL1, PALB2, PDGFRA, PHOX2B, PMS2, POLD1, POLE, POT1, PRKAR1A, PTCH1, PTEN, RAD50, RAD51C, RAD51D, RB1, RECQL4, RET, RNF43, RUNX1, SDHAF2, SDHA (sequence changes only), SDHB, SDHC, SDHD, SMAD4, SMARCA4, SMARCB1, SMARCE1, STK11, SUFU, TERC, TERT, TMEM127, TP53, TSC1, TSC2, VHL, WRN and WT1.    01/09/2020 Surgery   Bilateral mastectomies with reconstruction [facility]):  Right breast: no evidence of malignancy Left breast: multifocal invasive ductal carcinoma, grade 3, largest 2.2cm, with high grade DCIS, clear margins, 4 left axillary lymph nodes negative for carcinoma.   03/18/2020 -  Chemotherapy    Patient is on Treatment Plan: BREAST AC Q21D / CARBOPLATIN D1 + PACLITAXEL D1,8,15 Q21D        CHIEF COMPLIANT: Cycle 4 Taxol, carbo and pembrolizumab  INTERVAL HISTORY: Amanda Burns is a 40 y.o. with above-mentioned history of left breast cancerwhounderwent bilateral mastectomies with reconstruction and is currently on adjuvant chemotherapy with Taxol, carbo and pembrolizumab after completing 4 cycles of Adriamycin and Cytoxan.She presents to the clinic todayfor a toxicity checkand treatment.  She had 4 days of diarrhea after the last week of treatment.  She is also very tired and weak.  However she is working hard to increase her fluid intake and keeping up with her nutrition.  ALLERGIES:  is allergic to adhesive [tape], benzoyl peroxide, and hydrocodone.  MEDICATIONS:  Current Outpatient Medications  Medication Sig Dispense Refill  . acetaminophen (TYLENOL) 500 MG tablet Take  1,000 mg by mouth every 6 (six) hours as needed for moderate pain or headache.    .  Adapalene 0.3 % gel Apply 1 application topically at bedtime as needed (acne).     Marland Kitchen amphetamine-dextroamphetamine (ADDERALL XR) 20 MG 24 hr capsule Take 20 mg by mouth daily.     Marland Kitchen bismuth subsalicylate (PEPTO BISMOL) 262 MG/15ML suspension Take 30 mLs by mouth every 6 (six) hours as needed for indigestion or diarrhea or loose stools.    Marland Kitchen buPROPion (WELLBUTRIN XL) 150 MG 24 hr tablet Take 150 mg by mouth daily.    Marland Kitchen escitalopram (LEXAPRO) 20 MG tablet Take 20 mg by mouth daily.    . fluticasone (FLONASE) 50 MCG/ACT nasal spray Place 1 spray into both nostrils daily as needed for allergies or rhinitis.    Marland Kitchen ibuprofen (ADVIL) 200 MG tablet Take 400 mg by mouth every 6 (six) hours as needed for headache or moderate pain.    . Ibuprofen-diphenhydrAMINE HCl (IBUPROFEN PM) 200-25 MG CAPS Take 2 tablets by mouth at bedtime as needed (sleep).    Marland Kitchen lidocaine-prilocaine (EMLA) cream Apply to affected area once (Patient taking differently: Apply 1 application topically daily as needed (port access). ) 30 g 3  . LORazepam (ATIVAN) 0.5 MG tablet Take 1 tablet (0.5 mg total) by mouth at bedtime as needed for sleep. 30 tablet 0  . Multiple Vitamins-Minerals (MULTIVITAMIN WITH MINERALS) tablet Take 1 tablet by mouth daily.    Marland Kitchen omeprazole (PRILOSEC) 20 MG capsule Take 20 mg by mouth daily.    . ondansetron (ZOFRAN) 8 MG tablet Take 1 tablet (8 mg total) by mouth 2 (two) times daily as needed. Start on the third day after chemotherapy. 30 tablet 1  . prochlorperazine (COMPAZINE) 10 MG tablet Take 1 tablet (10 mg total) by mouth every 6 (six) hours as needed (Nausea or vomiting). 30 tablet 1   No current facility-administered medications for this visit.   Facility-Administered Medications Ordered in Other Visits  Medication Dose Route Frequency Provider Last Rate Last Admin  . sodium chloride flush (NS) 0.9 % injection 10 mL  10 mL Intravenous PRN [name], MD   10 mL at 03/18/20 1033    PHYSICAL  EXAMINATION: ECOG PERFORMANCE STATUS: 2 - Symptomatic, <50% confined to bed  Vitals:   07/16/20 1234  BP: (!) 105/94  Pulse: (!) 107  Resp: 20  Temp: 97.9 F (36.6 C)  SpO2: 100%   Filed Weights   07/16/20 1234  Weight: 167 lb (75.8 kg)     LABORATORY DATA:  I have reviewed the data as listed CMP Latest Ref Rng & Units 07/16/2020 07/09/2020 07/02/2020  Glucose 70 - 99 mg/dL 111(H) 118(H) 122(H)  BUN 6 - 20 mg/dL $Remove'6 6 8  'HPEjlbB$ Creatinine 0.44 - 1.00 mg/dL 0.69 0.73 0.71  Sodium 135 - 145 mmol/L 138 140 141  Potassium 3.5 - 5.1 mmol/L 3.6 3.4(L) 3.8  Chloride 98 - 111 mmol/L 104 105 105  CO2 22 - 32 mmol/L $RemoveB'26 25 27  'GSFXuUaB$ Calcium 8.9 - 10.3 mg/dL 8.9 9.1 9.0  Total Protein 6.5 - 8.1 g/dL 7.0 7.1 7.0  Total Bilirubin 0.3 - 1.2 mg/dL <0.2(L) <0.2(L) <0.2(L)  Alkaline Phos 38 - 126 U/L 100 136(H) 94  AST 15 - 41 U/L 55(H) 61(H) 37  ALT 0 - 44 U/L 116(H) 111(H) 69(H)    Lab Results  Component Value Date   WBC 3.1 (L) 07/16/2020   HGB 9.0 (L) 07/16/2020  HCT 28.2 (L) 07/16/2020   MCV 82.2 07/16/2020   PLT 313 07/16/2020   NEUTROABS 1.8 07/16/2020    ASSESSMENT & PLAN:  Malignant neoplasm of upper-outer quadrant of left breast in female, estrogen receptor negative (HCC) Palpable left breast mass: 1.8 cm UOQ fibroadenoma in the right breast, left breast 3 masses 2 to 2:30 position: 1.7, 1.8, 1.4 cm. Biopsy revealed IDC grade 3, ER 0%, PR 0%, Ki-67 80%, HER-2 -1+  T1c M0 stage IB  Treatment plan: 1.Bilateral mastectomies 01/09/2020: Right mastectomy:PASH Left mastectomy: Multifocal IDC grade 3 2.2 cm largest, high-grade DCIS, margins negative, 0/4 sentinel lymph nodes, ER negative, PR negative, HER-2 negative, Ki-67 80% 2.I recommended adjuvant chemotherapy with dose dense Adriamycin andCytoxanpembrolizumabfollowed by Taxol, carboplatin and pembrolizumab 3.Adjuvant  radiation ----------------------------------------------------------------------------------------------------------------------------------- Current treatment: Completed 4 cycles of Adriamycin and Cytoxanand pembrolizumab every 3 weeks, today cycle 1 Taxol with [place] and pembrolizumab.  She will get Taxol weekly. Labs reviewed  Chemo toxicities: 1.Mild nausea 2.Profoundfatigue: Continues. 3.Anemia: Patient was anemic even prior to starting cycle 1 chemo. Iron studies reveal a ferritin of 122and iron saturation of 23%, no role of IV iron therapy.Today's hemoglobin is 9. Monitoring 4.Lack of taste 5.  Diarrhea: Difficult to differentiate between immunotherapy related versus chemo related.  I instructed her to take Imodium next time it happens. 6.  Elevated LFTs: Monitoring very closely.  I reduce the dosage of Taxol today.    No orders of the defined types were placed in this encounter.  The patient has a good understanding of the overall plan. she agrees with it. she will call with any problems that may develop before the next visit here.  Total time spent: 30 mins including face to face time and time spent for planning, charting and coordination of care  Rulon Eisenmenger, MD, MPH 07/16/2020  I, Cloyde Reams Dorshimer, am acting as scribe for Dr. [name].  I have reviewed the above documentation for accuracy and completeness, and I agree with the above.

## 2020-07-16 ENCOUNTER — Other Ambulatory Visit: Payer: Self-pay

## 2020-07-16 ENCOUNTER — Inpatient Hospital Stay: Payer: BC Managed Care – PPO

## 2020-07-16 ENCOUNTER — Inpatient Hospital Stay (HOSPITAL_BASED_OUTPATIENT_CLINIC_OR_DEPARTMENT_OTHER): Payer: BC Managed Care – PPO | Admitting: Hematology and Oncology

## 2020-07-16 ENCOUNTER — Inpatient Hospital Stay: Payer: BC Managed Care – PPO | Attending: Hematology and Oncology

## 2020-07-16 ENCOUNTER — Encounter: Payer: Self-pay | Admitting: Hematology and Oncology

## 2020-07-16 VITALS — HR 98

## 2020-07-16 DIAGNOSIS — Z803 Family history of malignant neoplasm of breast: Secondary | ICD-10-CM | POA: Insufficient documentation

## 2020-07-16 DIAGNOSIS — C50412 Malignant neoplasm of upper-outer quadrant of left female breast: Secondary | ICD-10-CM | POA: Diagnosis present

## 2020-07-16 DIAGNOSIS — Z79899 Other long term (current) drug therapy: Secondary | ICD-10-CM | POA: Diagnosis not present

## 2020-07-16 DIAGNOSIS — Z5112 Encounter for antineoplastic immunotherapy: Secondary | ICD-10-CM | POA: Insufficient documentation

## 2020-07-16 DIAGNOSIS — E039 Hypothyroidism, unspecified: Secondary | ICD-10-CM | POA: Insufficient documentation

## 2020-07-16 DIAGNOSIS — Z9013 Acquired absence of bilateral breasts and nipples: Secondary | ICD-10-CM | POA: Insufficient documentation

## 2020-07-16 DIAGNOSIS — D241 Benign neoplasm of right breast: Secondary | ICD-10-CM | POA: Insufficient documentation

## 2020-07-16 DIAGNOSIS — Z5111 Encounter for antineoplastic chemotherapy: Secondary | ICD-10-CM | POA: Insufficient documentation

## 2020-07-16 DIAGNOSIS — R7989 Other specified abnormal findings of blood chemistry: Secondary | ICD-10-CM | POA: Diagnosis not present

## 2020-07-16 DIAGNOSIS — R197 Diarrhea, unspecified: Secondary | ICD-10-CM | POA: Insufficient documentation

## 2020-07-16 DIAGNOSIS — D649 Anemia, unspecified: Secondary | ICD-10-CM | POA: Diagnosis not present

## 2020-07-16 DIAGNOSIS — Z95828 Presence of other vascular implants and grafts: Secondary | ICD-10-CM

## 2020-07-16 DIAGNOSIS — Z171 Estrogen receptor negative status [ER-]: Secondary | ICD-10-CM | POA: Insufficient documentation

## 2020-07-16 LAB — CMP (CANCER CENTER ONLY)
ALT: 116 U/L — ABNORMAL HIGH (ref 0–44)
AST: 55 U/L — ABNORMAL HIGH (ref 15–41)
Albumin: 3.8 g/dL (ref 3.5–5.0)
Alkaline Phosphatase: 100 U/L (ref 38–126)
Anion gap: 8 (ref 5–15)
BUN: 6 mg/dL (ref 6–20)
CO2: 26 mmol/L (ref 22–32)
Calcium: 8.9 mg/dL (ref 8.9–10.3)
Chloride: 104 mmol/L (ref 98–111)
Creatinine: 0.69 mg/dL (ref 0.44–1.00)
GFR, Estimated: >60 mL/min (ref >60)
Glucose, Bld: 111 mg/dL — ABNORMAL HIGH (ref 70–99)
Potassium: 3.6 mmol/L (ref 3.5–5.1)
Sodium: 138 mmol/L (ref 135–145)
Total Bilirubin: <0.2 mg/dL — ABNORMAL LOW (ref 0.3–1.2)
Total Protein: 7 g/dL (ref 6.5–8.1)

## 2020-07-16 LAB — CBC WITH DIFFERENTIAL (CANCER CENTER ONLY)
Abs Immature Granulocytes: 0.01 10*3/uL (ref 0.00–0.07)
Basophils Absolute: 0 10*3/uL (ref 0.0–0.1)
Basophils Relative: 1 %
Eosinophils Absolute: 0.1 10*3/uL (ref 0.0–0.5)
Eosinophils Relative: 2 %
HCT: 28.2 % — ABNORMAL LOW (ref 36.0–46.0)
Hemoglobin: 9 g/dL — ABNORMAL LOW (ref 12.0–15.0)
Immature Granulocytes: 0 %
Lymphocytes Relative: 28 %
Lymphs Abs: 0.9 10*3/uL (ref 0.7–4.0)
MCH: 26.2 pg (ref 26.0–34.0)
MCHC: 31.9 g/dL (ref 30.0–36.0)
MCV: 82.2 fL (ref 80.0–100.0)
Monocytes Absolute: 0.4 10*3/uL (ref 0.1–1.0)
Monocytes Relative: 12 %
Neutro Abs: 1.8 10*3/uL (ref 1.7–7.7)
Neutrophils Relative %: 57 %
Platelet Count: 313 10*3/uL (ref 150–400)
RBC: 3.43 MIL/uL — ABNORMAL LOW (ref 3.87–5.11)
RDW: 17.6 % — ABNORMAL HIGH (ref 11.5–15.5)
WBC Count: 3.1 10*3/uL — ABNORMAL LOW (ref 4.0–10.5)
nRBC: 0 % (ref 0.0–0.2)

## 2020-07-16 LAB — TSH: TSH: 0.625 u[IU]/mL (ref 0.350–4.500)

## 2020-07-16 MED ORDER — SODIUM CHLORIDE 0.9 % IV SOLN
200.0000 mg | Freq: Once | INTRAVENOUS | Status: AC
  Administered 2020-07-16: 200 mg via INTRAVENOUS
  Filled 2020-07-16: qty 8

## 2020-07-16 MED ORDER — SODIUM CHLORIDE 0.9% FLUSH
10.0000 mL | Freq: Once | INTRAVENOUS | Status: AC
  Administered 2020-07-16: 10 mL
  Filled 2020-07-16: qty 10

## 2020-07-16 MED ORDER — DIPHENHYDRAMINE HCL 50 MG/ML IJ SOLN
INTRAMUSCULAR | Status: AC
  Filled 2020-07-16: qty 1

## 2020-07-16 MED ORDER — SODIUM CHLORIDE 0.9 % IV SOLN
65.0000 mg/m2 | Freq: Once | INTRAVENOUS | Status: AC
  Administered 2020-07-16: 120 mg via INTRAVENOUS
  Filled 2020-07-16: qty 20

## 2020-07-16 MED ORDER — HEPARIN SOD (PORK) LOCK FLUSH 100 UNIT/ML IV SOLN
500.0000 [IU] | Freq: Once | INTRAVENOUS | Status: AC | PRN
  Administered 2020-07-16: 500 [IU]
  Filled 2020-07-16: qty 5

## 2020-07-16 MED ORDER — SODIUM CHLORIDE 0.9 % IV SOLN
10.0000 mg | Freq: Once | INTRAVENOUS | Status: AC
  Administered 2020-07-16: 10 mg via INTRAVENOUS
  Filled 2020-07-16: qty 10

## 2020-07-16 MED ORDER — FAMOTIDINE 20 MG IN NS 100 ML IVPB
20.0000 mg | Freq: Once | INTRAVENOUS | Status: AC
  Administered 2020-07-16: 20 mg via INTRAVENOUS

## 2020-07-16 MED ORDER — SODIUM CHLORIDE 0.9 % IV SOLN
Freq: Once | INTRAVENOUS | Status: AC
  Filled 2020-07-16: qty 250

## 2020-07-16 MED ORDER — DIPHENHYDRAMINE HCL 50 MG/ML IJ SOLN
50.0000 mg | Freq: Once | INTRAMUSCULAR | Status: AC
  Administered 2020-07-16: 50 mg via INTRAVENOUS

## 2020-07-16 MED ORDER — SODIUM CHLORIDE 0.9% FLUSH
10.0000 mL | INTRAVENOUS | Status: DC | PRN
Start: 2020-07-16 — End: 2020-07-16
  Administered 2020-07-16: 10 mL
  Filled 2020-07-16: qty 10

## 2020-07-16 MED ORDER — FAMOTIDINE 20 MG IN NS 100 ML IVPB
INTRAVENOUS | Status: AC
  Filled 2020-07-16: qty 100

## 2020-07-16 NOTE — Progress Notes (Signed)
Patient met me in hallway to inform she misplaced her [employer] application.  Advised I would leave another for her at front desk for pick up on her way out.   She verbalized understanding.  Application in white folder at front desk along with my card for any additional financial questions or concerns.

## 2020-07-16 NOTE — Progress Notes (Signed)
Per Dr. Geralyn Flash note from today :Elevated LFTs: Monitoring very closely.  I reduce the dosage of Taxol today.

## 2020-07-16 NOTE — Patient Instructions (Signed)
[facility]COL[facility]  Discharge Instructions: Thank you for choosing [facility] to provide your oncology and hematology care.   If you have a lab appointment with the Cancer Center, please go directly to the Cancer Center and check in at the registration area.   Wear comfortable clothing and clothing appropriate for easy access to any Portacath or PICC line.   We strive to give you quality time with your provider. You may need to reschedule your appointment if you arrive late (15 or more minutes).  Arriving late affects you and other patients whose appointments are after yours.  Also, if you miss three or more appointments without notifying the office, you may be dismissed from the clinic at the provider's discretion.      For prescription refill requests, have your pharmacy contact our office and allow 72 hours for refills to be completed.    Today you received the following chemotherapy and/or immunotherapy agents Keytruda and Taxol.    To help prevent nausea and vomiting after your treatment, we encourage you to take your nausea medication as directed.  BELOW ARE SYMPTOMS THAT SHOULD BE REPORTED IMMEDIATELY: . *FEVER GREATER THAN 100.4 F (38 C) OR HIGHER . *CHILLS OR SWEATING . *NAUSEA AND VOMITING THAT IS NOT CONTROLLED WITH YOUR NAUSEA MEDICATION . *UNUSUAL SHORTNESS OF BREATH . *UNUSUAL BRUISING OR BLEEDING . *URINARY PROBLEMS (pain or burning when urinating, or frequent urination) . *BOWEL PROBLEMS (unusual diarrhea, constipation, pain near the anus) . TENDERNESS IN MOUTH AND THROAT WITH OR WITHOUT PRESENCE OF ULCERS (sore throat, sores in mouth, or a toothache) . UNUSUAL RASH, SWELLING OR PAIN  . UNUSUAL VAGINAL DISCHARGE OR ITCHING   Items with * indicate a potential emergency and should be followed up as soon as possible or go to the Emergency Department if any problems should occur.  Please show the CHEMOTHERAPY ALERT CARD or IMMUNOTHERAPY  ALERT CARD at check-in to the Emergency Department and triage nurse.  Should you have questions after your visit or need to cancel or reschedule your appointment, please contact [facility]  Dept: [phone]  and follow the prompts.  Office hours are 8:00 a.m. to 4:30 p.m. Monday - Friday. Please note that voicemails left after 4:00 p.m. may not be returned until the following business day.  We are closed weekends and major holidays. You have access to a nurse at all times for urgent questions. Please call the main number to the clinic Dept: [phone] and follow the prompts.   For any non-urgent questions, you may also contact your provider using MyChart. We now offer e-Visits for anyone 18 and older to request care online for non-urgent symptoms. For details visit [email or web address].   Also download the MyChart app! Go to the app store, search "MyChart", open the app, select [facility], and log in with your MyChart username and password.  Due to Covid, a mask is required upon entering the hospital/clinic. If you do not have a mask, one will be given to you upon arrival. For doctor visits, patients may have 1 support person aged 18 or older with them. For treatment visits, patients cannot have anyone with them due to current Covid guidelines and our immunocompromised population.   

## 2020-07-16 NOTE — Patient Instructions (Signed)
Implanted Port Insertion, Care After This sheet gives you information about how to care for yourself after your procedure. Your health care provider may also give you more specific instructions. If you have problems or questions, contact your health care provider. What can I expect after the procedure? After the procedure, it is common to have:  Discomfort at the port insertion site.  Bruising on the skin over the port. This should improve over 3-4 days. Follow these instructions at home: Port care  After your port is placed, you will get a manufacturer's information card. The card has information about your port. Keep this card with you at all times.  Take care of the port as told by your health care provider. Ask your health care provider if you or a family member can get training for taking care of the port at home. A home health care nurse may also take care of the port.  Make sure to remember what type of port you have. Incision care  Follow instructions from your health care provider about how to take care of your port insertion site. Make sure you: ? Wash your hands with soap and water before and after you change your bandage (dressing). If soap and water are not available, use hand sanitizer. ? Change your dressing as told by your health care provider. ? Leave stitches (sutures), skin glue, or adhesive strips in place. These skin closures may need to stay in place for 2 weeks or longer. If adhesive strip edges start to loosen and curl up, you may trim the loose edges. Do not remove adhesive strips completely unless your health care provider tells you to do that.  Check your port insertion site every day for signs of infection. Check for: ? Redness, swelling, or pain. ? Fluid or blood. ? Warmth. ? Pus or a bad smell.      Activity  Return to your normal activities as told by your health care provider. Ask your health care provider what activities are safe for you.  Do not  lift anything that is heavier than 10 lb (4.5 kg), or the limit that you are told, until your health care provider says that it is safe. General instructions  Take over-the-counter and prescription medicines only as told by your health care provider.  Do not take baths, swim, or use a hot tub until your health care provider approves. Ask your health care provider if you may take showers. You may only be allowed to take sponge baths.  Do not drive for 24 hours if you were given a sedative during your procedure.  Wear a medical alert bracelet in case of an emergency. This will tell any health care providers that you have a port.  Keep all follow-up visits as told by your health care provider. This is important. Contact a health care provider if:  You cannot flush your port with saline as directed, or you cannot draw blood from the port.  You have a fever or chills.  You have redness, swelling, or pain around your port insertion site.  You have fluid or blood coming from your port insertion site.  Your port insertion site feels warm to the touch.  You have pus or a bad smell coming from the port insertion site. Get help right away if:  You have chest pain or shortness of breath.  You have bleeding from your port that you cannot control. Summary  Take care of the port as told by your   health care provider. Keep the manufacturer's information card with you at all times.  Change your dressing as told by your health care provider.  Contact a health care provider if you have a fever or chills or if you have redness, swelling, or pain around your port insertion site.  Keep all follow-up visits as told by your health care provider. This information is not intended to replace advice given to you by your health care provider. Make sure you discuss any questions you have with your health care provider. Document Revised: 09/26/2017 Document Reviewed: 09/26/2017 Elsevier Patient Education   2021 Elsevier Inc.  

## 2020-07-17 DIAGNOSIS — F339 Major depressive disorder, recurrent, unspecified: Secondary | ICD-10-CM | POA: Insufficient documentation

## 2020-07-23 ENCOUNTER — Other Ambulatory Visit: Payer: Self-pay | Admitting: Hematology and Oncology

## 2020-07-23 ENCOUNTER — Inpatient Hospital Stay: Payer: BC Managed Care – PPO

## 2020-07-23 ENCOUNTER — Other Ambulatory Visit: Payer: Self-pay

## 2020-07-23 VITALS — BP 125/87 | HR 95 | Temp 98.5°F | Resp 16 | Wt 166.0 lb

## 2020-07-23 DIAGNOSIS — C50412 Malignant neoplasm of upper-outer quadrant of left female breast: Secondary | ICD-10-CM

## 2020-07-23 DIAGNOSIS — Z5112 Encounter for antineoplastic immunotherapy: Secondary | ICD-10-CM | POA: Diagnosis not present

## 2020-07-23 DIAGNOSIS — Z95828 Presence of other vascular implants and grafts: Secondary | ICD-10-CM

## 2020-07-23 DIAGNOSIS — Z171 Estrogen receptor negative status [ER-]: Secondary | ICD-10-CM

## 2020-07-23 LAB — CBC WITH DIFFERENTIAL (CANCER CENTER ONLY)
Abs Immature Granulocytes: 0.05 10*3/uL (ref 0.00–0.07)
Basophils Absolute: 0 10*3/uL (ref 0.0–0.1)
Basophils Relative: 1 %
Eosinophils Absolute: 0 10*3/uL (ref 0.0–0.5)
Eosinophils Relative: 1 %
HCT: 30.2 % — ABNORMAL LOW (ref 36.0–46.0)
Hemoglobin: 9.4 g/dL — ABNORMAL LOW (ref 12.0–15.0)
Immature Granulocytes: 2 %
Lymphocytes Relative: 19 %
Lymphs Abs: 0.6 10*3/uL — ABNORMAL LOW (ref 0.7–4.0)
MCH: 26 pg (ref 26.0–34.0)
MCHC: 31.1 g/dL (ref 30.0–36.0)
MCV: 83.4 fL (ref 80.0–100.0)
Monocytes Absolute: 0.2 10*3/uL (ref 0.1–1.0)
Monocytes Relative: 6 %
Neutro Abs: 2.1 10*3/uL (ref 1.7–7.7)
Neutrophils Relative %: 71 %
Platelet Count: 244 10*3/uL (ref 150–400)
RBC: 3.62 MIL/uL — ABNORMAL LOW (ref 3.87–5.11)
RDW: 17.2 % — ABNORMAL HIGH (ref 11.5–15.5)
WBC Count: 3 10*3/uL — ABNORMAL LOW (ref 4.0–10.5)
nRBC: 0 % (ref 0.0–0.2)

## 2020-07-23 LAB — CMP (CANCER CENTER ONLY)
ALT: 62 U/L — ABNORMAL HIGH (ref 0–44)
AST: 40 U/L (ref 15–41)
Albumin: 3.8 g/dL (ref 3.5–5.0)
Alkaline Phosphatase: 116 U/L (ref 38–126)
Anion gap: 8 (ref 5–15)
BUN: 6 mg/dL (ref 6–20)
CO2: 25 mmol/L (ref 22–32)
Calcium: 9.2 mg/dL (ref 8.9–10.3)
Chloride: 105 mmol/L (ref 98–111)
Creatinine: 0.73 mg/dL (ref 0.44–1.00)
GFR, Estimated: >60 mL/min (ref >60)
Glucose, Bld: 131 mg/dL — ABNORMAL HIGH (ref 70–99)
Potassium: 3.7 mmol/L (ref 3.5–5.1)
Sodium: 138 mmol/L (ref 135–145)
Total Bilirubin: 0.2 mg/dL — ABNORMAL LOW (ref 0.3–1.2)
Total Protein: 7.3 g/dL (ref 6.5–8.1)

## 2020-07-23 LAB — TSH: TSH: 0.924 u[IU]/mL (ref 0.308–3.960)

## 2020-07-23 MED ORDER — SODIUM CHLORIDE 0.9 % IV SOLN
10.0000 mg | Freq: Once | INTRAVENOUS | Status: AC
  Administered 2020-07-23: 10 mg via INTRAVENOUS
  Filled 2020-07-23: qty 10

## 2020-07-23 MED ORDER — DIPHENHYDRAMINE HCL 50 MG/ML IJ SOLN
50.0000 mg | Freq: Once | INTRAMUSCULAR | Status: AC
  Administered 2020-07-23: 50 mg via INTRAVENOUS

## 2020-07-23 MED ORDER — SODIUM CHLORIDE 0.9 % IV SOLN
715.0000 mg | Freq: Once | INTRAVENOUS | Status: AC
  Administered 2020-07-23: 720 mg via INTRAVENOUS
  Filled 2020-07-23: qty 72

## 2020-07-23 MED ORDER — HEPARIN SOD (PORK) LOCK FLUSH 100 UNIT/ML IV SOLN
500.0000 [IU] | Freq: Once | INTRAVENOUS | Status: AC | PRN
Start: 2020-07-23 — End: 2020-07-23
  Administered 2020-07-23: 500 [IU]
  Filled 2020-07-23: qty 5

## 2020-07-23 MED ORDER — SODIUM CHLORIDE 0.9% FLUSH
10.0000 mL | Freq: Once | INTRAVENOUS | Status: AC
  Administered 2020-07-23: 10 mL
  Filled 2020-07-23: qty 10

## 2020-07-23 MED ORDER — PALONOSETRON HCL INJECTION 0.25 MG/5ML
INTRAVENOUS | Status: AC
  Filled 2020-07-23: qty 5

## 2020-07-23 MED ORDER — PALONOSETRON HCL INJECTION 0.25 MG/5ML
0.2500 mg | Freq: Once | INTRAVENOUS | Status: AC
  Administered 2020-07-23: 0.25 mg via INTRAVENOUS

## 2020-07-23 MED ORDER — FAMOTIDINE 20 MG IN NS 100 ML IVPB
INTRAVENOUS | Status: AC
  Filled 2020-07-23: qty 100

## 2020-07-23 MED ORDER — SODIUM CHLORIDE 0.9 % IV SOLN
150.0000 mg | Freq: Once | INTRAVENOUS | Status: AC
  Administered 2020-07-23: 150 mg via INTRAVENOUS
  Filled 2020-07-23: qty 150

## 2020-07-23 MED ORDER — FAMOTIDINE 20 MG IN NS 100 ML IVPB
20.0000 mg | Freq: Once | INTRAVENOUS | Status: AC
  Administered 2020-07-23: 20 mg via INTRAVENOUS

## 2020-07-23 MED ORDER — SODIUM CHLORIDE 0.9% FLUSH
10.0000 mL | INTRAVENOUS | Status: DC | PRN
  Administered 2020-07-23: 10 mL
  Filled 2020-07-23: qty 10

## 2020-07-23 MED ORDER — SODIUM CHLORIDE 0.9 % IV SOLN
50.0000 mg/m2 | Freq: Once | INTRAVENOUS | Status: AC
  Administered 2020-07-23: 90 mg via INTRAVENOUS
  Filled 2020-07-23: qty 15

## 2020-07-23 MED ORDER — SODIUM CHLORIDE 0.9 % IV SOLN
Freq: Once | INTRAVENOUS | Status: AC
  Filled 2020-07-23: qty 250

## 2020-07-23 MED ORDER — DIPHENHYDRAMINE HCL 50 MG/ML IJ SOLN
INTRAMUSCULAR | Status: AC
  Filled 2020-07-23: qty 1

## 2020-07-23 NOTE — Progress Notes (Signed)
During intake assessment, patient reported an onset of peripheral neuropathy in bilateral feet beginning Saturday (07/18/20). [name] was made aware and he reduced Taxol dose to 50mg /m2.

## 2020-07-23 NOTE — Progress Notes (Signed)
Continued peripheral neuropathy worsening: We will reduce the dosage of Taxol to 50 mg per metered squared

## 2020-07-24 ENCOUNTER — Encounter: Payer: Self-pay | Admitting: Oncology

## 2020-07-24 NOTE — Progress Notes (Signed)
Received clinical intake worksheet from [employer] via fax.  Answered question w/Kelsey RN assistance.  Faxed back to [employer]. Fax received ok per confirmation sheet.

## 2020-07-24 NOTE — Progress Notes (Signed)
Received patient portion of [employer] application for copay assistance for [employer].  Faxed to [employer]. Fax received ok per confirmation sheet.

## 2020-07-28 NOTE — Progress Notes (Signed)
..  The following Medication: Brendolyn Patty is approved for drug replacement program by [employer]. The enrollment period is from 07/28/2020 to 03/13/2021.  Reason for Assistance: Insurance Denial. ID: [id number] First DOS:03/20/2020. *Program retro services up to 365 days of approval date.

## 2020-07-30 ENCOUNTER — Other Ambulatory Visit: Payer: Self-pay

## 2020-07-30 ENCOUNTER — Inpatient Hospital Stay: Payer: BC Managed Care – PPO

## 2020-07-30 ENCOUNTER — Inpatient Hospital Stay (HOSPITAL_BASED_OUTPATIENT_CLINIC_OR_DEPARTMENT_OTHER): Payer: BC Managed Care – PPO | Admitting: Adult Health

## 2020-07-30 ENCOUNTER — Encounter: Payer: Self-pay | Admitting: Adult Health

## 2020-07-30 VITALS — BP 117/78 | HR 108 | Temp 97.7°F | Resp 18 | Ht 61.0 in | Wt 166.1 lb

## 2020-07-30 VITALS — HR 100

## 2020-07-30 DIAGNOSIS — Z171 Estrogen receptor negative status [ER-]: Secondary | ICD-10-CM | POA: Diagnosis not present

## 2020-07-30 DIAGNOSIS — C50412 Malignant neoplasm of upper-outer quadrant of left female breast: Secondary | ICD-10-CM | POA: Diagnosis not present

## 2020-07-30 DIAGNOSIS — Z5112 Encounter for antineoplastic immunotherapy: Secondary | ICD-10-CM | POA: Diagnosis not present

## 2020-07-30 DIAGNOSIS — Z95828 Presence of other vascular implants and grafts: Secondary | ICD-10-CM

## 2020-07-30 LAB — CBC WITH DIFFERENTIAL (CANCER CENTER ONLY)
Abs Immature Granulocytes: 0.01 10*3/uL (ref 0.00–0.07)
Basophils Absolute: 0 10*3/uL (ref 0.0–0.1)
Basophils Relative: 1 %
Eosinophils Absolute: 0 10*3/uL (ref 0.0–0.5)
Eosinophils Relative: 0 %
HCT: 28.3 % — ABNORMAL LOW (ref 36.0–46.0)
Hemoglobin: 9.2 g/dL — ABNORMAL LOW (ref 12.0–15.0)
Immature Granulocytes: 0 %
Lymphocytes Relative: 24 %
Lymphs Abs: 0.6 10*3/uL — ABNORMAL LOW (ref 0.7–4.0)
MCH: 26.7 pg (ref 26.0–34.0)
MCHC: 32.5 g/dL (ref 30.0–36.0)
MCV: 82 fL (ref 80.0–100.0)
Monocytes Absolute: 0.2 10*3/uL (ref 0.1–1.0)
Monocytes Relative: 8 %
Neutro Abs: 1.7 10*3/uL (ref 1.7–7.7)
Neutrophils Relative %: 67 %
Platelet Count: 232 10*3/uL (ref 150–400)
RBC: 3.45 MIL/uL — ABNORMAL LOW (ref 3.87–5.11)
RDW: 16.6 % — ABNORMAL HIGH (ref 11.5–15.5)
WBC Count: 2.5 10*3/uL — ABNORMAL LOW (ref 4.0–10.5)
nRBC: 0 % (ref 0.0–0.2)

## 2020-07-30 LAB — CMP (CANCER CENTER ONLY)
ALT: 39 U/L (ref 0–44)
AST: 25 U/L (ref 15–41)
Albumin: 3.8 g/dL (ref 3.5–5.0)
Alkaline Phosphatase: 112 U/L (ref 38–126)
Anion gap: 10 (ref 5–15)
BUN: 9 mg/dL (ref 6–20)
CO2: 26 mmol/L (ref 22–32)
Calcium: 9.3 mg/dL (ref 8.9–10.3)
Chloride: 104 mmol/L (ref 98–111)
Creatinine: 0.71 mg/dL (ref 0.44–1.00)
GFR, Estimated: >60 mL/min (ref >60)
Glucose, Bld: 125 mg/dL — ABNORMAL HIGH (ref 70–99)
Potassium: 3.7 mmol/L (ref 3.5–5.1)
Sodium: 140 mmol/L (ref 135–145)
Total Bilirubin: <0.2 mg/dL — ABNORMAL LOW (ref 0.3–1.2)
Total Protein: 7.1 g/dL (ref 6.5–8.1)

## 2020-07-30 LAB — TSH: TSH: 0.846 u[IU]/mL (ref 0.308–3.960)

## 2020-07-30 MED ORDER — SODIUM CHLORIDE 0.9% FLUSH
10.0000 mL | Freq: Once | INTRAVENOUS | Status: AC
Start: 2020-07-30 — End: 2020-07-30
  Administered 2020-07-30: 10 mL
  Filled 2020-07-30: qty 10

## 2020-07-30 MED ORDER — DIPHENHYDRAMINE HCL 50 MG/ML IJ SOLN
INTRAMUSCULAR | Status: AC
  Filled 2020-07-30: qty 1

## 2020-07-30 MED ORDER — HEPARIN SOD (PORK) LOCK FLUSH 100 UNIT/ML IV SOLN
500.0000 [IU] | Freq: Once | INTRAVENOUS | Status: AC | PRN
  Administered 2020-07-30: 500 [IU]
  Filled 2020-07-30: qty 5

## 2020-07-30 MED ORDER — SODIUM CHLORIDE 0.9% FLUSH
10.0000 mL | INTRAVENOUS | Status: DC | PRN
  Administered 2020-07-30: 10 mL
  Filled 2020-07-30: qty 10

## 2020-07-30 MED ORDER — FAMOTIDINE 20 MG IN NS 100 ML IVPB
INTRAVENOUS | Status: AC
  Filled 2020-07-30: qty 100

## 2020-07-30 MED ORDER — FAMOTIDINE 20 MG IN NS 100 ML IVPB
20.0000 mg | Freq: Once | INTRAVENOUS | Status: AC
  Administered 2020-07-30: 20 mg via INTRAVENOUS

## 2020-07-30 MED ORDER — DIPHENHYDRAMINE HCL 50 MG/ML IJ SOLN
50.0000 mg | Freq: Once | INTRAMUSCULAR | Status: AC
Start: 2020-07-30 — End: 2020-07-30
  Administered 2020-07-30: 50 mg via INTRAVENOUS

## 2020-07-30 MED ORDER — SODIUM CHLORIDE 0.9 % IV SOLN
Freq: Once | INTRAVENOUS | Status: AC
  Filled 2020-07-30: qty 250

## 2020-07-30 MED ORDER — SODIUM CHLORIDE 0.9 % IV SOLN
10.0000 mg | Freq: Once | INTRAVENOUS | Status: AC
  Administered 2020-07-30: 10 mg via INTRAVENOUS
  Filled 2020-07-30: qty 10

## 2020-07-30 MED ORDER — SODIUM CHLORIDE 0.9 % IV SOLN
50.0000 mg/m2 | Freq: Once | INTRAVENOUS | Status: AC
  Administered 2020-07-30: 90 mg via INTRAVENOUS
  Filled 2020-07-30: qty 15

## 2020-07-30 NOTE — Patient Instructions (Signed)
[facility]  Discharge Instructions: Thank you for choosing [facility] to provide your oncology and hematology care.   If you have a lab appointment with the Cancer Center, please go directly to the Cancer Center and check in at the registration area.   Wear comfortable clothing and clothing appropriate for easy access to any Portacath or PICC line.   We strive to give you quality time with your provider. You may need to reschedule your appointment if you arrive late (15 or more minutes).  Arriving late affects you and other patients whose appointments are after yours.  Also, if you miss three or more appointments without notifying the office, you may be dismissed from the clinic at the provider's discretion.      For prescription refill requests, have your pharmacy contact our office and allow 72 hours for refills to be completed.    Today you received the following chemotherapy and/or immunotherapy agents taxol      To help prevent nausea and vomiting after your treatment, we encourage you to take your nausea medication as directed.  BELOW ARE SYMPTOMS THAT SHOULD BE REPORTED IMMEDIATELY: *FEVER GREATER THAN 100.4 F (38 C) OR HIGHER *CHILLS OR SWEATING *NAUSEA AND VOMITING THAT IS NOT CONTROLLED WITH YOUR NAUSEA MEDICATION *UNUSUAL SHORTNESS OF BREATH *UNUSUAL BRUISING OR BLEEDING *URINARY PROBLEMS (pain or burning when urinating, or frequent urination) *BOWEL PROBLEMS (unusual diarrhea, constipation, pain near the anus) TENDERNESS IN MOUTH AND THROAT WITH OR WITHOUT PRESENCE OF ULCERS (sore throat, sores in mouth, or a toothache) UNUSUAL RASH, SWELLING OR PAIN  UNUSUAL VAGINAL DISCHARGE OR ITCHING   Items with * indicate a potential emergency and should be followed up as soon as possible or go to the Emergency Department if any problems should occur.  Please show the CHEMOTHERAPY ALERT CARD or IMMUNOTHERAPY ALERT CARD at check-in to the  Emergency Department and triage nurse.  Should you have questions after your visit or need to cancel or reschedule your appointment, please contact [facility]  Dept: [phone]  and follow the prompts.  Office hours are 8:00 a.m. to 4:30 p.m. Monday - Friday. Please note that voicemails left after 4:00 p.m. may not be returned until the following business day.  We are closed weekends and major holidays. You have access to a nurse at all times for urgent questions. Please call the main number to the clinic Dept: [phone] and follow the prompts.   For any non-urgent questions, you may also contact your provider using MyChart. We now offer e-Visits for anyone 18 and older to request care online for non-urgent symptoms. For details visit [email or web address].   Also download the MyChart app! Go to the app store, search "MyChart", open the app, select [facility], and log in with your MyChart username and password.  Due to Covid, a mask is required upon entering the hospital/clinic. If you do not have a mask, one will be given to you upon arrival. For doctor visits, patients may have 1 support person aged 18 or older with them. For treatment visits, patients cannot have anyone with them due to current Covid guidelines and our immunocompromised population.   

## 2020-07-30 NOTE — Assessment & Plan Note (Signed)
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 Biopsy revealed IDC grade 3, ER 0%, PR 0%, Ki-67 80%, HER-2 -1+  T1c M0 stage IB  Treatment plan: 1.Bilateral mastectomies 01/09/2020: Right mastectomy:PASH Left mastectomy: Multifocal IDC grade 3 2.2 cm largest, high-grade DCIS, margins negative, 0/4 sentinel lymph nodes, ER negative, PR negative, HER-2 negative, Ki-67 80% 2.I recommended adjuvant chemotherapy with dose dense Adriamycin andCytoxanpembrolizumabfollowed by Taxol, carboplatin and pembrolizumab 3.Adjuvant radiation ----------------------------------------------------------------------------------------------------------------------------------- Current treatment: Completed 4 cycles of Adriamycin and Cytoxanand pembrolizumab every 3 weeks, receiving Pembrolizumab, Carbo and Taxol.  Today she is due for Taxol alone. Labs reviewed  Chemo toxicities: 1.Peripheral neuropathy: grade 1.  She has undergone dose reduction.  We reviewed that the neuropathy can worsen and we need to monitor it closely and be aware if it progresses. She understands this.   2. Liver enzyme elevation: I reviewed with her that sometimes this happens with the Taxol, and her labs have subsequently improved with the dose reduction.    She is feeling moderately well and we will continue to see her with every other treatment.  I reviewed the above with [name] in detail who is in agreement with the plan.

## 2020-07-30 NOTE — Progress Notes (Signed)
[facility] Follow up:    Patient, [name] (Inactive) No address on file   DIAGNOSIS: Cancer Staging Malignant neoplasm of upper-outer quadrant of left breast in female, estrogen receptor negative ([place]) Staging form: Breast, AJCC 8th Edition - Clinical: Stage IB (cT1c, cN0, cM0, G3, ER-, PR-, HER2-) - Signed by [name], MD on 12/04/2019 Histologic grading system: 3 grade system   SUMMARY OF ONCOLOGIC HISTORY: Oncology History  Malignant neoplasm of upper-outer quadrant of left breast in female, estrogen receptor negative ([place])  11/29/2019 Initial Diagnosis   Patient palpated an area of concern in the left breast. Diagnostic mammogram and US showed a 1.8cm mass in the upper outer right breast representing a fibroadenoma, and in the left breast, three adjacent masses at the 2-2:30 position, 1.7cm, 0.8cm, and 1.4cm, with several cysts and no axillary adenopathy. Biopsy showed a benign fibroadenoma in the right breast, and in the left breast, IDC at the 2:30 position, grade 3, HER-2 negative (1+), ER+ 0%, PR+ 0%, Ki67 80%.    12/04/2019 Cancer Staging   Staging form: Breast, AJCC 8th Edition - Clinical: Stage IB (cT1c, cN0, cM0, G3, ER-, PR-, HER2-) - Signed by [name], MD on 12/04/2019   12/21/2019 Genetic Testing   Negative genetic testing:  No pathogenic variants detected on the [facility] Multi-Cancer Panel. A variant of uncertain significance (VUS) was detected in the CDK4 gene called c.415C>T (p.Arg139*). The report date is 12/21/2019.  The Multi-Cancer Panel offered by [facility] includes sequencing and/or deletion duplication testing of the following 85 genes: AIP, ALK, APC, ATM, AXIN2,BAP1,  BARD1, BLM, BMPR1A, BRCA1, BRCA2, BRIP1, CASR, CDC73, CDH1, CDK4, CDKN1B, CDKN1C, CDKN2A (p14ARF), CDKN2A (p16INK4a), CEBPA, CHEK2, CTNNA1, DICER1, DIS3L2, EGFR (c.2369C>T, p.Thr790Met variant only), EPCAM (Deletion/duplication testing only), FH, FLCN, GATA2, GPC3, GREM1  (Promoter region deletion/duplication testing only), HOXB13 (c.251G>A, p.Gly84Glu), HRAS, KIT, MAX, MEN1, MET, MITF (c.952G>A, p.Glu318Lys variant only), MLH1, MSH2, MSH3, MSH6, MUTYH, NBN, NF1, NF2, NTHL1, PALB2, PDGFRA, PHOX2B, PMS2, POLD1, POLE, POT1, PRKAR1A, PTCH1, PTEN, RAD50, RAD51C, RAD51D, RB1, RECQL4, RET, RNF43, RUNX1, SDHAF2, SDHA (sequence changes only), SDHB, SDHC, SDHD, SMAD4, SMARCA4, SMARCB1, SMARCE1, STK11, SUFU, TERC, TERT, TMEM127, TP53, TSC1, TSC2, VHL, WRN and WT1.    01/09/2020 Surgery   Bilateral mastectomies with reconstruction [facility]):  Right breast: no evidence of malignancy Left breast: multifocal invasive ductal carcinoma, grade 3, largest 2.2cm, with high grade DCIS, clear margins, 4 left axillary lymph nodes negative for carcinoma.   03/18/2020 -  Chemotherapy    Patient is on Treatment Plan: BREAST AC Q21D / CARBOPLATIN D1 + PACLITAXEL D1,8,15 Q21D        CURRENT THERAPY:  Pembrolizumab every 21 days; Paclitaxel and Carbo on days 1, Paclitaxel only on days 8 and 15, every 21 days.   INTERVAL HISTORY: Amanda Burns 40 y.o. female returns for evaluation prior to receiving her next weekly Taxol dose.  She is doing well.  She has had two issues during the Paclitaxel.  First, she has had a slight liver enzyme elevation that has required a dose reduction.  Then, started 1.5 weeks ago she developed mild intermittent numbness in her toes.  Her Taxol dose was further reduced and she notes no improvement or worsening in the neuropathy.  She denies numbness or tingling in her hands.  She notes that she is otherwise feeling well today.   Patient Active Problem List   Diagnosis Date Noted  . Port-A-Cath in place 06/25/2020  . Breast  cancer, left breast ([place]) 01/09/2020  . Genetic testing 12/23/2019  . Family history of breast cancer   . Family history of colon cancer   . Family history of adrenal cancer   . Family history of throat cancer   . Malignant  neoplasm of upper-outer quadrant of left breast in female, estrogen receptor negative ([place]) 11/29/2019  . Normal labor 10/01/2014  . Postpartum care following vaginal delivery (3/28) 06/08/2013  . Pregnancy 06/07/2013    is allergic to adhesive [tape], benzoyl peroxide, and hydrocodone.  MEDICAL HISTORY: Past Medical History:  Diagnosis Date  . Anxiety   . Breast cancer ([place]) 2021  . Depression   . Family history of adrenal cancer   . Family history of breast cancer   . Family history of colon cancer   . Family history of throat cancer   . GERD (gastroesophageal reflux disease) 2015  . History of kidney stones 2006  . Postpartum care following vaginal delivery (3/28) 06/08/2013    SURGICAL HISTORY: Past Surgical History:  Procedure Laterality Date  . BREAST RECONSTRUCTION WITH PLACEMENT OF TISSUE EXPANDER AND FLEX HD (ACELLULAR HYDRATED DERMIS) Bilateral 01/09/2020   Procedure: BREAST RECONSTRUCTION WITH PLACEMENT OF TISSUE EXPANDER AND FLEX HD (ACELLULAR HYDRATED DERMIS);  Surgeon: Irene Limbo, MD;  Location: [facility];  Service: Plastics;  Laterality: Bilateral;  . FOOT SURGERY    . MASTECTOMY W/ SENTINEL NODE BIOPSY Bilateral 01/09/2020   Procedure: BILATERAL MASTECTOMY WITH LEFT SENTINEL LYMPH NODE BIOPSY;  Surgeon: [name], MD;  Location: [facility];  Service: General;  Laterality: Bilateral;  PECTORAL BLOCK, RNFA  . PORTACATH PLACEMENT Right 01/09/2020   Procedure: INSERTION PORT-A-CATH;  Surgeon: [name], MD;  Location: [facility];  Service: General;  Laterality: Right;    SOCIAL HISTORY: Social History   Socioeconomic History  . Marital status: Single    Spouse name: Not on file  . Number of children: Not on file  . Years of education: Not on file  . Highest education level: Not on file  Occupational History  . Not on file  Tobacco Use  . Smoking status: Never Smoker  . Smokeless tobacco: Never Used  Vaping Use  . Vaping Use: Never used  Substance and  Sexual Activity  . Alcohol use: No  . Drug use: No  . Sexual activity: Yes    Birth control/protection: None  Other Topics Concern  . Not on file  Social History Narrative  . Not on file   Social Determinants of Health   Financial Resource Strain: Not on file  Food Insecurity: Not on file  Transportation Needs: Not on file  Physical Activity: Not on file  Stress: Not on file  Social Connections: Not on file  Intimate Partner Violence: Not on file    FAMILY HISTORY: Family History  Problem Relation Age of Onset  . Breast cancer Other        maternal great-aunt (MGF's sister), dx in her 54s  . Breast cancer Maternal Grandmother 22  . Colon cancer Maternal Grandmother 72  . Cancer Maternal Grandmother 53       Adrenal cancer  . Breast cancer Paternal Aunt        dx. in her 3s  . Throat cancer Paternal Aunt        dx. in her 67s, non-smoker    Review of Systems  Constitutional: Negative for appetite change, chills, fatigue, fever and unexpected weight change.  HENT:   Negative for hearing loss, lump/mass and trouble  swallowing.   Eyes: Negative for eye problems and icterus.  Respiratory: Negative for chest tightness, cough and shortness of breath.   Cardiovascular: Negative for chest pain, leg swelling and palpitations.  Gastrointestinal: Negative for abdominal distention, abdominal pain, constipation, diarrhea, nausea and vomiting.  Endocrine: Negative for hot flashes.  Genitourinary: Negative for difficulty urinating.   Musculoskeletal: Negative for arthralgias.  Skin: Negative for itching and rash.  Neurological: Positive for numbness. Negative for dizziness, extremity weakness and headaches.  Hematological: Negative for adenopathy. Does not bruise/bleed easily.  Psychiatric/Behavioral: Negative for depression. The patient is not nervous/anxious.       PHYSICAL EXAMINATION  ECOG PERFORMANCE STATUS: 1 - Symptomatic but completely ambulatory  Vitals:   07/30/20  1052  BP: 117/78  Pulse: (!) 108  Resp: 18  Temp: 97.7 F (36.5 C)  SpO2: 100%    Physical Exam Constitutional:      General: She is not in acute distress.    Appearance: Normal appearance. She is not toxic-appearing.  HENT:     Head: Normocephalic and atraumatic.  Eyes:     General: No scleral icterus. Cardiovascular:     Rate and Rhythm: Regular rhythm. Tachycardia present.     Pulses: Normal pulses.     Heart sounds: Normal heart sounds.  Pulmonary:     Effort: Pulmonary effort is normal.     Breath sounds: Normal breath sounds.  Abdominal:     General: Abdomen is flat. Bowel sounds are normal. There is no distension.     Palpations: Abdomen is soft.     Tenderness: There is no abdominal tenderness.  Musculoskeletal:        General: No swelling.     Cervical back: Neck supple.  Lymphadenopathy:     Cervical: No cervical adenopathy.  Skin:    General: Skin is warm and dry.     Findings: No rash.  Neurological:     General: No focal deficit present.     Mental Status: She is alert.  Psychiatric:        Mood and Affect: Mood normal.        Behavior: Behavior normal.     LABORATORY DATA:  CBC    Component Value Date/Time   WBC 2.5 (L) 07/30/2020 1032   WBC 7.3 01/03/2020 0914   RBC 3.45 (L) 07/30/2020 1032   HGB 9.2 (L) 07/30/2020 1032   HCT 28.3 (L) 07/30/2020 1032   PLT 232 07/30/2020 1032   MCV 82.0 07/30/2020 1032   MCH 26.7 07/30/2020 1032   MCHC 32.5 07/30/2020 1032   RDW 16.6 (H) 07/30/2020 1032   LYMPHSABS 0.6 (L) 07/30/2020 1032   MONOABS 0.2 07/30/2020 1032   EOSABS 0.0 07/30/2020 1032   BASOSABS 0.0 07/30/2020 1032    CMP     Component Value Date/Time   NA 140 07/30/2020 1032   K 3.7 07/30/2020 1032   CL 104 07/30/2020 1032   CO2 26 07/30/2020 1032   GLUCOSE 125 (H) 07/30/2020 1032   BUN 9 07/30/2020 1032   CREATININE 0.71 07/30/2020 1032   CALCIUM 9.3 07/30/2020 1032   PROT 7.1 07/30/2020 1032   ALBUMIN 3.8 07/30/2020 1032    AST 25 07/30/2020 1032   ALT 39 07/30/2020 1032   ALKPHOS 112 07/30/2020 1032   BILITOT <0.2 (L) 07/30/2020 1032   GFRNONAA >60 07/30/2020 1032   GFRAA >60 12/04/2019 0835        ASSESSMENT and PLAN:   Malignant neoplasm of upper-outer quadrant  of left breast in female, estrogen receptor negative ([place]) Palpable left breast mass: 1.8 cm UOQ fibroadenoma in the right breast, left breast 3 masses 2 to 2:30 position: 1.7, 1.8, 1.4 cm. Biopsy revealed IDC grade 3, ER 0%, PR 0%, Ki-67 80%, HER-2 -1+  T1c M0 stage IB  Treatment plan: 1.Bilateral mastectomies 01/09/2020: Right mastectomy:PASH Left mastectomy: Multifocal IDC grade 3 2.2 cm largest, high-grade DCIS, margins negative, 0/4 sentinel lymph nodes, ER negative, PR negative, HER-2 negative, Ki-67 80% 2.I recommended adjuvant chemotherapy with dose dense Adriamycin andCytoxanpembrolizumabfollowed by Taxol, carboplatin and pembrolizumab 3.Adjuvant radiation ----------------------------------------------------------------------------------------------------------------------------------- Current treatment: Completed 4 cycles of Adriamycin and Cytoxanand pembrolizumab every 3 weeks, receiving Pembrolizumab, Carbo and Taxol.  Today she is due for Taxol alone. Labs reviewed  Chemo toxicities: 1.Peripheral neuropathy: grade 1.  She has undergone dose reduction.  We reviewed that the neuropathy can worsen and we need to monitor it closely and be aware if it progresses. She understands this.   2. Liver enzyme elevation: I reviewed with her that sometimes this happens with the Taxol, and her labs have subsequently improved with the dose reduction.    She is feeling moderately well and we will continue to see her with every other treatment.  I reviewed the above with [name] in detail who is in agreement with the plan.     All questions were answered. The patient knows to call the clinic with any problems, questions or  concerns. We can certainly see the patient much sooner if necessary.  Total encounter time: 30 minutes* in chart review, collaboration with oncologist, order review, face to face visit time, and documentation.   Wilber Bihari, NP 07/30/20 11:27 AM Medical Oncology and Hematology [facility] [place], [place] [address] Tel. [phone]    Fax. [phone]  *Total Encounter Time as defined by the Centers for Medicare and Medicaid Services includes, in addition to the face-to-face time of a patient visit (documented in the note above) non-face-to-face time: obtaining and reviewing outside history, ordering and reviewing medications, tests or procedures, care coordination (communications with other health care professionals or caregivers) and documentation in the medical record.

## 2020-08-06 ENCOUNTER — Inpatient Hospital Stay: Payer: BC Managed Care – PPO

## 2020-08-06 ENCOUNTER — Other Ambulatory Visit: Payer: Self-pay

## 2020-08-06 ENCOUNTER — Other Ambulatory Visit: Payer: Self-pay | Admitting: Hematology and Oncology

## 2020-08-06 VITALS — BP 125/86 | HR 97 | Temp 98.6°F | Resp 16

## 2020-08-06 DIAGNOSIS — Z95828 Presence of other vascular implants and grafts: Secondary | ICD-10-CM

## 2020-08-06 DIAGNOSIS — Z171 Estrogen receptor negative status [ER-]: Secondary | ICD-10-CM

## 2020-08-06 DIAGNOSIS — C50412 Malignant neoplasm of upper-outer quadrant of left female breast: Secondary | ICD-10-CM

## 2020-08-06 DIAGNOSIS — Z5112 Encounter for antineoplastic immunotherapy: Secondary | ICD-10-CM | POA: Diagnosis not present

## 2020-08-06 LAB — CBC WITH DIFFERENTIAL (CANCER CENTER ONLY)
Abs Immature Granulocytes: 0.01 10*3/uL (ref 0.00–0.07)
Basophils Absolute: 0 10*3/uL (ref 0.0–0.1)
Basophils Relative: 1 %
Eosinophils Absolute: 0 10*3/uL (ref 0.0–0.5)
Eosinophils Relative: 1 %
HCT: 25.8 % — ABNORMAL LOW (ref 36.0–46.0)
Hemoglobin: 8.4 g/dL — ABNORMAL LOW (ref 12.0–15.0)
Immature Granulocytes: 1 %
Lymphocytes Relative: 30 %
Lymphs Abs: 0.6 10*3/uL — ABNORMAL LOW (ref 0.7–4.0)
MCH: 26.6 pg (ref 26.0–34.0)
MCHC: 32.6 g/dL (ref 30.0–36.0)
MCV: 81.6 fL (ref 80.0–100.0)
Monocytes Absolute: 0.2 10*3/uL (ref 0.1–1.0)
Monocytes Relative: 10 %
Neutro Abs: 1.2 10*3/uL — ABNORMAL LOW (ref 1.7–7.7)
Neutrophils Relative %: 57 %
Platelet Count: 199 10*3/uL (ref 150–400)
RBC: 3.16 MIL/uL — ABNORMAL LOW (ref 3.87–5.11)
RDW: 17.2 % — ABNORMAL HIGH (ref 11.5–15.5)
WBC Count: 2 10*3/uL — ABNORMAL LOW (ref 4.0–10.5)
nRBC: 0 % (ref 0.0–0.2)

## 2020-08-06 LAB — CMP (CANCER CENTER ONLY)
ALT: 41 U/L (ref 0–44)
AST: 27 U/L (ref 15–41)
Albumin: 3.7 g/dL (ref 3.5–5.0)
Alkaline Phosphatase: 99 U/L (ref 38–126)
Anion gap: 8 (ref 5–15)
BUN: 9 mg/dL (ref 6–20)
CO2: 27 mmol/L (ref 22–32)
Calcium: 9.1 mg/dL (ref 8.9–10.3)
Chloride: 105 mmol/L (ref 98–111)
Creatinine: 0.66 mg/dL (ref 0.44–1.00)
GFR, Estimated: >60 mL/min (ref >60)
Glucose, Bld: 113 mg/dL — ABNORMAL HIGH (ref 70–99)
Potassium: 3.8 mmol/L (ref 3.5–5.1)
Sodium: 140 mmol/L (ref 135–145)
Total Bilirubin: <0.2 mg/dL — ABNORMAL LOW (ref 0.3–1.2)
Total Protein: 7 g/dL (ref 6.5–8.1)

## 2020-08-06 LAB — TSH: TSH: 0.869 u[IU]/mL (ref 0.308–3.960)

## 2020-08-06 MED ORDER — SODIUM CHLORIDE 0.9% FLUSH
10.0000 mL | INTRAVENOUS | Status: DC | PRN
  Administered 2020-08-06: 10 mL
  Filled 2020-08-06: qty 10

## 2020-08-06 MED ORDER — SODIUM CHLORIDE 0.9% FLUSH
10.0000 mL | Freq: Once | INTRAVENOUS | Status: AC
Start: 2020-08-06 — End: 2020-08-06
  Administered 2020-08-06: 10 mL
  Filled 2020-08-06: qty 10

## 2020-08-06 MED ORDER — HEPARIN SOD (PORK) LOCK FLUSH 100 UNIT/ML IV SOLN
500.0000 [IU] | Freq: Once | INTRAVENOUS | Status: AC | PRN
  Administered 2020-08-06: 500 [IU]
  Filled 2020-08-06: qty 5

## 2020-08-06 MED ORDER — DIPHENHYDRAMINE HCL 50 MG/ML IJ SOLN
INTRAMUSCULAR | Status: AC
  Filled 2020-08-06: qty 1

## 2020-08-06 MED ORDER — DIPHENHYDRAMINE HCL 50 MG/ML IJ SOLN
50.0000 mg | Freq: Once | INTRAMUSCULAR | Status: AC
  Administered 2020-08-06: 50 mg via INTRAVENOUS

## 2020-08-06 MED ORDER — SODIUM CHLORIDE 0.9 % IV SOLN
200.0000 mg | Freq: Once | INTRAVENOUS | Status: AC
  Administered 2020-08-06: 200 mg via INTRAVENOUS
  Filled 2020-08-06: qty 8

## 2020-08-06 MED ORDER — SODIUM CHLORIDE 0.9 % IV SOLN
Freq: Once | INTRAVENOUS | Status: AC
  Filled 2020-08-06: qty 250

## 2020-08-06 MED ORDER — FAMOTIDINE 20 MG IN NS 100 ML IVPB
INTRAVENOUS | Status: AC
  Filled 2020-08-06: qty 100

## 2020-08-06 MED ORDER — SODIUM CHLORIDE 0.9 % IV SOLN
10.0000 mg | Freq: Once | INTRAVENOUS | Status: AC
  Administered 2020-08-06: 10 mg via INTRAVENOUS
  Filled 2020-08-06: qty 10

## 2020-08-06 MED ORDER — SODIUM CHLORIDE 0.9 % IV SOLN
45.0000 mg/m2 | Freq: Once | INTRAVENOUS | Status: AC
  Administered 2020-08-06: 84 mg via INTRAVENOUS
  Filled 2020-08-06: qty 14

## 2020-08-06 MED ORDER — FAMOTIDINE 20 MG IN NS 100 ML IVPB
20.0000 mg | Freq: Once | INTRAVENOUS | Status: AC
  Administered 2020-08-06: 20 mg via INTRAVENOUS

## 2020-08-06 NOTE — Patient Instructions (Signed)
[facility]   Discharge Instructions: Thank you for choosing [facility] to provide your oncology and hematology care.   If you have a lab appointment with the Cancer Center, please go directly to the Cancer Center and check in at the registration area.   Wear comfortable clothing and clothing appropriate for easy access to any Portacath or PICC line.   We strive to give you quality time with your provider. You may need to reschedule your appointment if you arrive late (15 or more minutes).  Arriving late affects you and other patients whose appointments are after yours.  Also, if you miss three or more appointments without notifying the office, you may be dismissed from the clinic at the provider's discretion.      For prescription refill requests, have your pharmacy contact our office and allow 72 hours for refills to be completed.    Today you received the following chemotherapy and/or immunotherapy agents: paclitaxel.      To help prevent nausea and vomiting after your treatment, we encourage you to take your nausea medication as directed.  BELOW ARE SYMPTOMS THAT SHOULD BE REPORTED IMMEDIATELY: *FEVER GREATER THAN 100.4 F (38 C) OR HIGHER *CHILLS OR SWEATING *NAUSEA AND VOMITING THAT IS NOT CONTROLLED WITH YOUR NAUSEA MEDICATION *UNUSUAL SHORTNESS OF BREATH *UNUSUAL BRUISING OR BLEEDING *URINARY PROBLEMS (pain or burning when urinating, or frequent urination) *BOWEL PROBLEMS (unusual diarrhea, constipation, pain near the anus) TENDERNESS IN MOUTH AND THROAT WITH OR WITHOUT PRESENCE OF ULCERS (sore throat, sores in mouth, or a toothache) UNUSUAL RASH, SWELLING OR PAIN  UNUSUAL VAGINAL DISCHARGE OR ITCHING   Items with * indicate a potential emergency and should be followed up as soon as possible or go to the Emergency Department if any problems should occur.  Please show the CHEMOTHERAPY ALERT CARD or IMMUNOTHERAPY ALERT CARD at check-in  to the Emergency Department and triage nurse.  Should you have questions after your visit or need to cancel or reschedule your appointment, please contact [facility]  Dept: [phone]  and follow the prompts.  Office hours are 8:00 a.m. to 4:30 p.m. Monday - Friday. Please note that voicemails left after 4:00 p.m. may not be returned until the following business day.  We are closed weekends and major holidays. You have access to a nurse at all times for urgent questions. Please call the main number to the clinic Dept: [phone] and follow the prompts.   For any non-urgent questions, you may also contact your provider using MyChart. We now offer e-Visits for anyone 18 and older to request care online for non-urgent symptoms. For details visit [email or web address].   Also download the MyChart app! Go to the app store, search "MyChart", open the app, select [facility], and log in with your MyChart username and password.  Due to Covid, a mask is required upon entering the hospital/clinic. If you do not have a mask, one will be given to you upon arrival. For doctor visits, patients may have 1 support person aged 18 or older with them. For treatment visits, patients cannot have anyone with them due to current Covid guidelines and our immunocompromised population.   

## 2020-08-06 NOTE — Progress Notes (Signed)
Patient reports 1-week h/o persistent mild headache. Denies additional associated symptoms. Patient questioning if recent increase in omeprazole dose could be causing the headache. [name] notified. Informed patient to call our office or go to nearest ED if symptoms worsen or if new symptoms arise. Patient verbalized understanding and stated she will discuss with [name] during next OV.

## 2020-08-06 NOTE — Progress Notes (Signed)
Per Dr Lindi Adie, [place] to treat today despite ANC 1.2. Tim, RN aware.

## 2020-08-12 NOTE — Progress Notes (Signed)
Patient Care Team: Gara Kroner, DO as PCP - General (Family Medicine) [name], RN as Oncology Nurse Navigator [name], RN as Oncology Nurse Navigator [name], MD as Consulting Physician (General Surgery) [name], MD as Consulting Physician (Hematology and Oncology) [name], MD as Consulting Physician (Radiation Oncology)  DIAGNOSIS:    ICD-10-CM   1. Malignant neoplasm of upper-outer quadrant of left breast in female, estrogen receptor negative ([place])  C50.412    Z17.1     SUMMARY OF ONCOLOGIC HISTORY: Oncology History  Malignant neoplasm of upper-outer quadrant of left breast in female, estrogen receptor negative ([place])  11/29/2019 Initial Diagnosis   Patient palpated an area of concern in the left breast. Diagnostic mammogram and US showed a 1.8cm mass in the upper outer right breast representing a fibroadenoma, and in the left breast, three adjacent masses at the 2-2:30 position, 1.7cm, 0.8cm, and 1.4cm, with several cysts and no axillary adenopathy. Biopsy showed a benign fibroadenoma in the right breast, and in the left breast, IDC at the 2:30 position, grade 3, HER-2 negative (1+), ER+ 0%, PR+ 0%, Ki67 80%.    12/04/2019 Cancer Staging   Staging form: Breast, AJCC 8th Edition - Clinical: Stage IB (cT1c, cN0, cM0, G3, ER-, PR-, HER2-) - Signed by [name], MD on 12/04/2019   12/21/2019 Genetic Testing   Negative genetic testing:  No pathogenic variants detected on the [facility] Multi-Cancer Panel. A variant of uncertain significance (VUS) was detected in the CDK4 gene called c.415C>T (p.Arg139*). The report date is 12/21/2019.  The Multi-Cancer Panel offered by [facility] includes sequencing and/or deletion duplication testing of the following 85 genes: AIP, ALK, APC, ATM, AXIN2,BAP1,  BARD1, BLM, BMPR1A, BRCA1, BRCA2, BRIP1, CASR, CDC73, CDH1, CDK4, CDKN1B, CDKN1C, CDKN2A (p14ARF), CDKN2A (p16INK4a), CEBPA, CHEK2, CTNNA1, DICER1, DIS3L2, EGFR  (c.2369C>T, p.Thr790Met variant only), EPCAM (Deletion/duplication testing only), FH, FLCN, GATA2, GPC3, GREM1 (Promoter region deletion/duplication testing only), HOXB13 (c.251G>A, p.Gly84Glu), HRAS, KIT, MAX, MEN1, MET, MITF (c.952G>A, p.Glu318Lys variant only), MLH1, MSH2, MSH3, MSH6, MUTYH, NBN, NF1, NF2, NTHL1, PALB2, PDGFRA, PHOX2B, PMS2, POLD1, POLE, POT1, PRKAR1A, PTCH1, PTEN, RAD50, RAD51C, RAD51D, RB1, RECQL4, RET, RNF43, RUNX1, SDHAF2, SDHA (sequence changes only), SDHB, SDHC, SDHD, SMAD4, SMARCA4, SMARCB1, SMARCE1, STK11, SUFU, TERC, TERT, TMEM127, TP53, TSC1, TSC2, VHL, WRN and WT1.    01/09/2020 Surgery   Bilateral mastectomies with reconstruction [facility]):  Right breast: no evidence of malignancy Left breast: multifocal invasive ductal carcinoma, grade 3, largest 2.2cm, with high grade DCIS, clear margins, 4 left axillary lymph nodes negative for carcinoma.   03/18/2020 -  Chemotherapy    Patient is on Treatment Plan: BREAST AC Q21D / CARBOPLATIN D1 + PACLITAXEL D1,8,15 Q21D        CHIEF COMPLIANT: Cycle 7 Taxol, carbo and pembrolizumab  INTERVAL HISTORY: Amanda Burns is a 40 y.o. with above-mentioned history of left breast cancerwhounderwent bilateral mastectomies with reconstruction and is currently on adjuvant chemotherapy with Taxol, carbo and pembrolizumab after completing 4 cycles of Adriamycin and Cytoxan.She presents to the clinic todayfor a toxicity checkand treatment.  She feels extremely fatigued but is able to still manage her complex life taking care of her kids being a single mom taking care of all the chores working full-time and going through chemotherapy sessions.  Taste still has not fully recovered.  Denies any nausea or vomiting.  Neuropathy appears to be slightly improving.  ALLERGIES:  is allergic to adhesive [tape], benzoyl peroxide, and hydrocodone.  MEDICATIONS:  Current Outpatient Medications  Medication Sig Dispense Refill  .  acetaminophen (TYLENOL) 500 MG tablet Take 1,000 mg by mouth every 6 (six) hours as needed for moderate pain or headache.    . amphetamine-dextroamphetamine (ADDERALL XR) 20 MG 24 hr capsule Take 20 mg by mouth daily.     Marland Kitchen bismuth subsalicylate (PEPTO BISMOL) 262 MG/15ML suspension Take 30 mLs by mouth every 6 (six) hours as needed for indigestion or diarrhea or loose stools.    Marland Kitchen buPROPion (WELLBUTRIN XL) 150 MG 24 hr tablet Take 150 mg by mouth daily.    Marland Kitchen escitalopram (LEXAPRO) 20 MG tablet Take 20 mg by mouth daily.    . fluticasone (FLONASE) 50 MCG/ACT nasal spray Place 1 spray into both nostrils daily as needed for allergies or rhinitis.    Marland Kitchen ibuprofen (ADVIL) 200 MG tablet Take 400 mg by mouth every 6 (six) hours as needed for headache or moderate pain.    . Ibuprofen-diphenhydrAMINE HCl (IBUPROFEN PM) 200-25 MG CAPS Take 2 tablets by mouth at bedtime as needed (sleep).    Marland Kitchen lidocaine-prilocaine (EMLA) cream Apply to affected area once (Patient taking differently: Apply 1 application topically daily as needed (port access).) 30 g 3  . LORazepam (ATIVAN) 0.5 MG tablet Take 1 tablet (0.5 mg total) by mouth at bedtime as needed for sleep. 30 tablet 0  . Multiple Vitamins-Minerals (MULTIVITAMIN WITH MINERALS) tablet Take 1 tablet by mouth daily.    Marland Kitchen omeprazole (PRILOSEC) 20 MG capsule Take 40 mg by mouth daily.    . ondansetron (ZOFRAN) 8 MG tablet Take 1 tablet (8 mg total) by mouth 2 (two) times daily as needed. Start on the third day after chemotherapy. 30 tablet 1  . prochlorperazine (COMPAZINE) 10 MG tablet Take 1 tablet (10 mg total) by mouth every 6 (six) hours as needed (Nausea or vomiting). 30 tablet 1   No current facility-administered medications for this visit.   Facility-Administered Medications Ordered in Other Visits  Medication Dose Route Frequency Provider Last Rate Last Admin  . CARBOplatin (PARAPLATIN) 720 mg in sodium chloride 0.9 % 250 mL chemo infusion  720 mg  Intravenous Once [name], MD      . dexamethasone (DECADRON) 10 mg in sodium chloride 0.9 % 50 mL IVPB  10 mg Intravenous Once [name], MD 204 mL/hr at 08/13/20 1134 10 mg at 08/13/20 1134  . diphenhydrAMINE (BENADRYL) injection 50 mg  50 mg Intravenous Once [name], MD      . famotidine (PEPCID) IVPB 20 mg in NS 100 mL IVPB  20 mg Intravenous Once [name], MD      . fosaprepitant (EMEND) 150 mg in sodium chloride 0.9 % 145 mL IVPB  150 mg Intravenous Once [name], MD 450 mL/hr at 08/13/20 1139 150 mg at 08/13/20 1139  . heparin lock flush 100 unit/mL  500 Units Intracatheter Once PRN [name], MD      . PACLitaxel (TAXOL) 84 mg in sodium chloride 0.9 % 250 mL chemo infusion (</= $RemoveBefor'80mg'cdcRTYCAcwME$ /m2)  45 mg/m2 (Treatment Plan Recorded) Intravenous Once [name], MD      . palonosetron (ALOXI) injection 0.25 mg  0.25 mg Intravenous Once [name], MD      . sodium chloride flush (NS) 0.9 % injection 10 mL  10 mL Intravenous PRN [name], MD   10 mL at 03/18/20 1033  . sodium chloride flush (NS) 0.9 % injection 10 mL  10 mL Intracatheter PRN [name], MD  PHYSICAL EXAMINATION: ECOG PERFORMANCE STATUS: 1 - Symptomatic but completely ambulatory  Vitals:   08/13/20 1030  BP: 128/73  Pulse: 93  Resp: 17  Temp: 97.6 F (36.4 C)  SpO2: 100%   Filed Weights   08/13/20 1030  Weight: 167 lb 4.8 oz (75.9 kg)    LABORATORY DATA:  I have reviewed the data as listed CMP Latest Ref Rng & Units 08/13/2020 08/06/2020 07/30/2020  Glucose 70 - 99 mg/dL 108(H) 113(H) 125(H)  BUN 6 - 20 mg/dL $Remove'7 9 9  'TRgCeGX$ Creatinine 0.44 - 1.00 mg/dL 0.67 0.66 0.71  Sodium 135 - 145 mmol/L 141 140 140  Potassium 3.5 - 5.1 mmol/L 3.9 3.8 3.7  Chloride 98 - 111 mmol/L 104 105 104  CO2 22 - 32 mmol/L $RemoveB'25 27 26  'yrsVAYRd$ Calcium 8.9 - 10.3 mg/dL 9.7 9.1 9.3  Total Protein 6.5 - 8.1 g/dL 7.3 7.0 7.1  Total Bilirubin 0.3 - 1.2 mg/dL <0.2(L) <0.2(L) <0.2(L)  Alkaline Phos 38 - 126 U/L 130(H)  99 112  AST 15 - 41 U/L 37 27 25  ALT 0 - 44 U/L 67(H) 41 39    Lab Results  Component Value Date   WBC 2.3 (L) 08/13/2020   HGB 8.8 (L) 08/13/2020   HCT 25.8 (L) 08/13/2020   MCV 81.4 08/13/2020   PLT 149 (L) 08/13/2020   NEUTROABS 1.3 (L) 08/13/2020    ASSESSMENT & PLAN:  Malignant neoplasm of upper-outer quadrant of left breast in female, estrogen receptor negative ([facility]) Palpable left breast mass: 1.8 cm UOQ fibroadenoma in the right breast, left breast 3 masses 2 to 2:30 position: 1.7, 1.8, 1.4 cm. Biopsy revealed IDC grade 3, ER 0%, PR 0%, Ki-67 80%, HER-2 -1+  T1c M0 stage IB  Treatment plan: 1.Bilateral mastectomies 01/09/2020: Right mastectomy:PASH Left mastectomy: Multifocal IDC grade 3 2.2 cm largest, high-grade DCIS, margins negative, 0/4 sentinel lymph nodes, ER negative, PR negative, HER-2 negative, Ki-67 80% 2.I recommended adjuvant chemotherapy with dose dense Adriamycin andCytoxanpembrolizumabfollowed by Taxol, carboplatin and pembrolizumab 3.Adjuvant radiation ----------------------------------------------------------------------------------------------------------------------------------- Current treatment:Completed 4 cycles ofAdriamycin and Cytoxanand pembrolizumab every 3 weeks, receiving Pembrolizumab, Carbo and Taxol.  Today she is due for cycle 8 of Taxol, (will also receive [place] and Pembrolizumab) Labs reviewed  Chemo toxicities: 1.Peripheral neuropathy: grade 1.  Monitoring closely.  Taxol dose reduced to 45 mg per metered squared .  Neuropathy appears to be improving. 2. Liver enzyme elevation: Monitoring labs closely, ALT is 67 today. No immune mediated adverse effects. She will be receiving Taxol and Keytruda today. Her treatment has been slightly altered because of adverse effects. Return to clinic weekly for chemo and every other week for follow-up with me.    No orders of the defined types were placed in this encounter.  The  patient has a good understanding of the overall plan. she agrees with it. she will call with any problems that may develop before the next visit here.  Total time spent: 30 mins including face to face time and time spent for planning, charting and coordination of care  Rulon Eisenmenger, MD, MPH 08/13/2020  I, Cloyde Reams Dorshimer, am acting as scribe for Dr. [name].  I have reviewed the above documentation for accuracy and completeness, and I agree with the above.

## 2020-08-13 ENCOUNTER — Encounter: Payer: Self-pay | Admitting: *Deleted

## 2020-08-13 ENCOUNTER — Inpatient Hospital Stay (HOSPITAL_BASED_OUTPATIENT_CLINIC_OR_DEPARTMENT_OTHER): Payer: BC Managed Care – PPO | Admitting: Hematology and Oncology

## 2020-08-13 ENCOUNTER — Inpatient Hospital Stay: Payer: BC Managed Care – PPO

## 2020-08-13 ENCOUNTER — Other Ambulatory Visit: Payer: Self-pay

## 2020-08-13 ENCOUNTER — Inpatient Hospital Stay: Payer: BC Managed Care – PPO | Attending: Hematology and Oncology

## 2020-08-13 DIAGNOSIS — Z9013 Acquired absence of bilateral breasts and nipples: Secondary | ICD-10-CM | POA: Diagnosis not present

## 2020-08-13 DIAGNOSIS — Z171 Estrogen receptor negative status [ER-]: Secondary | ICD-10-CM

## 2020-08-13 DIAGNOSIS — C50412 Malignant neoplasm of upper-outer quadrant of left female breast: Secondary | ICD-10-CM

## 2020-08-13 DIAGNOSIS — D649 Anemia, unspecified: Secondary | ICD-10-CM | POA: Insufficient documentation

## 2020-08-13 DIAGNOSIS — D241 Benign neoplasm of right breast: Secondary | ICD-10-CM | POA: Insufficient documentation

## 2020-08-13 DIAGNOSIS — R7401 Elevation of levels of liver transaminase levels: Secondary | ICD-10-CM | POA: Diagnosis not present

## 2020-08-13 DIAGNOSIS — Z5111 Encounter for antineoplastic chemotherapy: Secondary | ICD-10-CM | POA: Diagnosis present

## 2020-08-13 DIAGNOSIS — G620 Drug-induced polyneuropathy: Secondary | ICD-10-CM | POA: Insufficient documentation

## 2020-08-13 DIAGNOSIS — Z803 Family history of malignant neoplasm of breast: Secondary | ICD-10-CM | POA: Diagnosis not present

## 2020-08-13 DIAGNOSIS — Z79899 Other long term (current) drug therapy: Secondary | ICD-10-CM | POA: Diagnosis not present

## 2020-08-13 DIAGNOSIS — Z5112 Encounter for antineoplastic immunotherapy: Secondary | ICD-10-CM | POA: Insufficient documentation

## 2020-08-13 DIAGNOSIS — T451X5A Adverse effect of antineoplastic and immunosuppressive drugs, initial encounter: Secondary | ICD-10-CM | POA: Insufficient documentation

## 2020-08-13 DIAGNOSIS — E039 Hypothyroidism, unspecified: Secondary | ICD-10-CM | POA: Diagnosis not present

## 2020-08-13 DIAGNOSIS — Z95828 Presence of other vascular implants and grafts: Secondary | ICD-10-CM

## 2020-08-13 LAB — CMP (CANCER CENTER ONLY)
ALT: 67 U/L — ABNORMAL HIGH (ref 0–44)
AST: 37 U/L (ref 15–41)
Albumin: 3.7 g/dL (ref 3.5–5.0)
Alkaline Phosphatase: 130 U/L — ABNORMAL HIGH (ref 38–126)
Anion gap: 12 (ref 5–15)
BUN: 7 mg/dL (ref 6–20)
CO2: 25 mmol/L (ref 22–32)
Calcium: 9.7 mg/dL (ref 8.9–10.3)
Chloride: 104 mmol/L (ref 98–111)
Creatinine: 0.67 mg/dL (ref 0.44–1.00)
GFR, Estimated: >60 mL/min (ref >60)
Glucose, Bld: 108 mg/dL — ABNORMAL HIGH (ref 70–99)
Potassium: 3.9 mmol/L (ref 3.5–5.1)
Sodium: 141 mmol/L (ref 135–145)
Total Bilirubin: <0.2 mg/dL — ABNORMAL LOW (ref 0.3–1.2)
Total Protein: 7.3 g/dL (ref 6.5–8.1)

## 2020-08-13 LAB — CBC WITH DIFFERENTIAL (CANCER CENTER ONLY)
Abs Immature Granulocytes: 0 10*3/uL (ref 0.00–0.07)
Basophils Absolute: 0 10*3/uL (ref 0.0–0.1)
Basophils Relative: 1 %
Eosinophils Absolute: 0 10*3/uL (ref 0.0–0.5)
Eosinophils Relative: 0 %
HCT: 25.8 % — ABNORMAL LOW (ref 36.0–46.0)
Hemoglobin: 8.8 g/dL — ABNORMAL LOW (ref 12.0–15.0)
Immature Granulocytes: 0 %
Lymphocytes Relative: 30 %
Lymphs Abs: 0.7 10*3/uL (ref 0.7–4.0)
MCH: 27.8 pg (ref 26.0–34.0)
MCHC: 34.1 g/dL (ref 30.0–36.0)
MCV: 81.4 fL (ref 80.0–100.0)
Monocytes Absolute: 0.2 10*3/uL (ref 0.1–1.0)
Monocytes Relative: 11 %
Neutro Abs: 1.3 10*3/uL — ABNORMAL LOW (ref 1.7–7.7)
Neutrophils Relative %: 58 %
Platelet Count: 149 10*3/uL — ABNORMAL LOW (ref 150–400)
RBC: 3.17 MIL/uL — ABNORMAL LOW (ref 3.87–5.11)
RDW: 18.6 % — ABNORMAL HIGH (ref 11.5–15.5)
WBC Count: 2.3 10*3/uL — ABNORMAL LOW (ref 4.0–10.5)
nRBC: 0 % (ref 0.0–0.2)

## 2020-08-13 LAB — TSH: TSH: 0.913 u[IU]/mL (ref 0.308–3.960)

## 2020-08-13 MED ORDER — DIPHENHYDRAMINE HCL 50 MG/ML IJ SOLN
50.0000 mg | Freq: Once | INTRAMUSCULAR | Status: AC
  Administered 2020-08-13: 50 mg via INTRAVENOUS

## 2020-08-13 MED ORDER — SODIUM CHLORIDE 0.9 % IV SOLN
150.0000 mg | Freq: Once | INTRAVENOUS | Status: AC
  Administered 2020-08-13: 150 mg via INTRAVENOUS
  Filled 2020-08-13: qty 150

## 2020-08-13 MED ORDER — PALONOSETRON HCL INJECTION 0.25 MG/5ML
0.2500 mg | Freq: Once | INTRAVENOUS | Status: AC
  Administered 2020-08-13: 0.25 mg via INTRAVENOUS

## 2020-08-13 MED ORDER — SODIUM CHLORIDE 0.9% FLUSH
10.0000 mL | INTRAVENOUS | Status: DC | PRN
  Administered 2020-08-13: 10 mL
  Filled 2020-08-13: qty 10

## 2020-08-13 MED ORDER — FAMOTIDINE 20 MG IN NS 100 ML IVPB
20.0000 mg | Freq: Once | INTRAVENOUS | Status: AC
  Administered 2020-08-13: 20 mg via INTRAVENOUS

## 2020-08-13 MED ORDER — SODIUM CHLORIDE 0.9% FLUSH
10.0000 mL | Freq: Once | INTRAVENOUS | Status: AC
  Administered 2020-08-13: 10 mL
  Filled 2020-08-13: qty 10

## 2020-08-13 MED ORDER — SODIUM CHLORIDE 0.9 % IV SOLN
45.0000 mg/m2 | Freq: Once | INTRAVENOUS | Status: AC
  Administered 2020-08-13: 84 mg via INTRAVENOUS
  Filled 2020-08-13 (×2): qty 14

## 2020-08-13 MED ORDER — SODIUM CHLORIDE 0.9 % IV SOLN
Freq: Once | INTRAVENOUS | Status: AC
  Filled 2020-08-13: qty 250

## 2020-08-13 MED ORDER — DIPHENHYDRAMINE HCL 50 MG/ML IJ SOLN
INTRAMUSCULAR | Status: AC
  Filled 2020-08-13: qty 1

## 2020-08-13 MED ORDER — SODIUM CHLORIDE 0.9 % IV SOLN
715.0000 mg | Freq: Once | INTRAVENOUS | Status: AC
  Administered 2020-08-13: 720 mg via INTRAVENOUS
  Filled 2020-08-13 (×2): qty 72

## 2020-08-13 MED ORDER — PALONOSETRON HCL INJECTION 0.25 MG/5ML
INTRAVENOUS | Status: AC
  Filled 2020-08-13: qty 5

## 2020-08-13 MED ORDER — HEPARIN SOD (PORK) LOCK FLUSH 100 UNIT/ML IV SOLN
500.0000 [IU] | Freq: Once | INTRAVENOUS | Status: AC | PRN
  Administered 2020-08-13: 500 [IU]
  Filled 2020-08-13: qty 5

## 2020-08-13 MED ORDER — FAMOTIDINE 20 MG IN NS 100 ML IVPB
INTRAVENOUS | Status: AC
  Filled 2020-08-13: qty 100

## 2020-08-13 MED ORDER — SODIUM CHLORIDE 0.9 % IV SOLN
10.0000 mg | Freq: Once | INTRAVENOUS | Status: AC
  Administered 2020-08-13: 10 mg via INTRAVENOUS
  Filled 2020-08-13: qty 10

## 2020-08-13 NOTE — Patient Instructions (Signed)

## 2020-08-13 NOTE — Progress Notes (Signed)
Per MD okay to treat today with ANC 1.3.

## 2020-08-13 NOTE — Assessment & Plan Note (Signed)
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 Biopsy revealed IDC grade 3, ER 0%, PR 0%, Ki-67 80%, HER-2 -1+  T1c M0 stage IB  Treatment plan: 1.Bilateral mastectomies 01/09/2020: Right mastectomy:PASH Left mastectomy: Multifocal IDC grade 3 2.2 cm largest, high-grade DCIS, margins negative, 0/4 sentinel lymph nodes, ER negative, PR negative, HER-2 negative, Ki-67 80% 2.I recommended adjuvant chemotherapy with dose dense Adriamycin andCytoxanpembrolizumabfollowed by Taxol, carboplatin and pembrolizumab 3.Adjuvant radiation ----------------------------------------------------------------------------------------------------------------------------------- Current treatment:Completed 4 cycles ofAdriamycin and Cytoxanand pembrolizumab every 3 weeks, receiving Pembrolizumab, Carbo and Taxol.  Today she is due for cycle 8 of Taxol, (will also receive carbo and Pembrolizumab) Labs reviewed  Chemo toxicities: 1.Peripheral neuropathy: grade 1.  Monitoring closely.  Taxol dose reduced to 45 mg per metered squared  2. Liver enzyme elevation: Monitoring labs closely No immune mediated adverse effects.  Return to clinic weekly for chemo and every other week for follow-up with me. 

## 2020-08-13 NOTE — Patient Instructions (Signed)
[facility]COLOG[facility]   Discharge Instructions: Thank you for choosing [facility] to provide your oncology and hematology care.   If you have a lab appointment with the Cancer Center, please go directly to the Cancer Center and check in at the registration area.   Wear comfortable clothing and clothing appropriate for easy access to any Portacath or PICC line.   We strive to give you quality time with your provider. You may need to reschedule your appointment if you arrive late (15 or more minutes).  Arriving late affects you and other patients whose appointments are after yours.  Also, if you miss three or more appointments without notifying the office, you may be dismissed from the clinic at the provider's discretion.      For prescription refill requests, have your pharmacy contact our office and allow 72 hours for refills to be completed.    Today you received the following chemotherapy and/or immunotherapy agents: paclitaxel and carboplatin.      To help prevent nausea and vomiting after your treatment, we encourage you to take your nausea medication as directed.  BELOW ARE SYMPTOMS THAT SHOULD BE REPORTED IMMEDIATELY: *FEVER GREATER THAN 100.4 F (38 C) OR HIGHER *CHILLS OR SWEATING *NAUSEA AND VOMITING THAT IS NOT CONTROLLED WITH YOUR NAUSEA MEDICATION *UNUSUAL SHORTNESS OF BREATH *UNUSUAL BRUISING OR BLEEDING *URINARY PROBLEMS (pain or burning when urinating, or frequent urination) *BOWEL PROBLEMS (unusual diarrhea, constipation, pain near the anus) TENDERNESS IN MOUTH AND THROAT WITH OR WITHOUT PRESENCE OF ULCERS (sore throat, sores in mouth, or a toothache) UNUSUAL RASH, SWELLING OR PAIN  UNUSUAL VAGINAL DISCHARGE OR ITCHING   Items with * indicate a potential emergency and should be followed up as soon as possible or go to the Emergency Department if any problems should occur.  Please show the CHEMOTHERAPY ALERT CARD or IMMUNOTHERAPY ALERT  CARD at check-in to the Emergency Department and triage nurse.  Should you have questions after your visit or need to cancel or reschedule your appointment, please contact [facility]  Dept: [phone]  and follow the prompts.  Office hours are 8:00 a.m. to 4:30 p.m. Monday - Friday. Please note that voicemails left after 4:00 p.m. may not be returned until the following business day.  We are closed weekends and major holidays. You have access to a nurse at all times for urgent questions. Please call the main number to the clinic Dept: [phone] and follow the prompts.   For any non-urgent questions, you may also contact your provider using MyChart. We now offer e-Visits for anyone 18 and older to request care online for non-urgent symptoms. For details visit [email or web address].   Also download the MyChart app! Go to the app store, search "MyChart", open the app, select [facility], and log in with your MyChart username and password.  Due to Covid, a mask is required upon entering the hospital/clinic. If you do not have a mask, one will be given to you upon arrival. For doctor visits, patients may have 1 support person aged 18 or older with them. For treatment visits, patients cannot have anyone with them due to current Covid guidelines and our immunocompromised population.   

## 2020-08-14 ENCOUNTER — Encounter: Payer: Self-pay | Admitting: Hematology and Oncology

## 2020-08-14 ENCOUNTER — Other Ambulatory Visit: Payer: Self-pay | Admitting: *Deleted

## 2020-08-14 NOTE — Progress Notes (Signed)
Correction to previous note: Copay assistance through [employer] for [place] look-back date was 05/13/20 and not 03/14/20.

## 2020-08-14 NOTE — Progress Notes (Signed)
Received approval letter from [employer] for copay assistance for [employer].  Patient approved for $25,000 03/14/20 - 03/13/21 leaving her with a $25 copay after insurance pays their portion.  A copy of the approval letter will be mailed to patient. Copy given to [facility] for billing/claim submissions.  Patient has my card for any additional financial questions or concerns.

## 2020-08-17 ENCOUNTER — Encounter: Payer: Self-pay | Admitting: Hematology and Oncology

## 2020-08-18 ENCOUNTER — Telehealth: Payer: Self-pay | Admitting: Hematology and Oncology

## 2020-08-18 NOTE — Telephone Encounter (Signed)
Sch per 6/2 los, pt aware

## 2020-08-19 ENCOUNTER — Encounter: Payer: Self-pay | Admitting: *Deleted

## 2020-08-20 ENCOUNTER — Encounter: Payer: Self-pay | Admitting: Licensed Clinical Social Worker

## 2020-08-20 ENCOUNTER — Other Ambulatory Visit: Payer: Self-pay | Admitting: Hematology and Oncology

## 2020-08-20 ENCOUNTER — Inpatient Hospital Stay: Payer: BC Managed Care – PPO

## 2020-08-20 ENCOUNTER — Encounter: Payer: Self-pay | Admitting: *Deleted

## 2020-08-20 ENCOUNTER — Other Ambulatory Visit: Payer: Self-pay

## 2020-08-20 VITALS — BP 119/90 | HR 116 | Temp 98.2°F | Resp 16 | Wt 168.5 lb

## 2020-08-20 DIAGNOSIS — Z95828 Presence of other vascular implants and grafts: Secondary | ICD-10-CM

## 2020-08-20 DIAGNOSIS — C50412 Malignant neoplasm of upper-outer quadrant of left female breast: Secondary | ICD-10-CM

## 2020-08-20 DIAGNOSIS — Z5112 Encounter for antineoplastic immunotherapy: Secondary | ICD-10-CM | POA: Diagnosis not present

## 2020-08-20 DIAGNOSIS — Z171 Estrogen receptor negative status [ER-]: Secondary | ICD-10-CM

## 2020-08-20 LAB — CBC WITH DIFFERENTIAL (CANCER CENTER ONLY)
Abs Immature Granulocytes: 0 10*3/uL (ref 0.00–0.07)
Basophils Absolute: 0 10*3/uL (ref 0.0–0.1)
Basophils Relative: 1 %
Eosinophils Absolute: 0 10*3/uL (ref 0.0–0.5)
Eosinophils Relative: 1 %
HCT: 24.6 % — ABNORMAL LOW (ref 36.0–46.0)
Hemoglobin: 8.3 g/dL — ABNORMAL LOW (ref 12.0–15.0)
Immature Granulocytes: 0 %
Lymphocytes Relative: 24 %
Lymphs Abs: 0.4 10*3/uL — ABNORMAL LOW (ref 0.7–4.0)
MCH: 27.7 pg (ref 26.0–34.0)
MCHC: 33.7 g/dL (ref 30.0–36.0)
MCV: 82 fL (ref 80.0–100.0)
Monocytes Absolute: 0.1 10*3/uL (ref 0.1–1.0)
Monocytes Relative: 7 %
Neutro Abs: 1.2 10*3/uL — ABNORMAL LOW (ref 1.7–7.7)
Neutrophils Relative %: 67 %
Platelet Count: 202 10*3/uL (ref 150–400)
RBC: 3 MIL/uL — ABNORMAL LOW (ref 3.87–5.11)
RDW: 18.3 % — ABNORMAL HIGH (ref 11.5–15.5)
WBC Count: 1.8 10*3/uL — ABNORMAL LOW (ref 4.0–10.5)
nRBC: 0 % (ref 0.0–0.2)

## 2020-08-20 LAB — CMP (CANCER CENTER ONLY)
ALT: 50 U/L — ABNORMAL HIGH (ref 0–44)
AST: 35 U/L (ref 15–41)
Albumin: 3.6 g/dL (ref 3.5–5.0)
Alkaline Phosphatase: 107 U/L (ref 38–126)
Anion gap: 9 (ref 5–15)
BUN: 13 mg/dL (ref 6–20)
CO2: 27 mmol/L (ref 22–32)
Calcium: 9.4 mg/dL (ref 8.9–10.3)
Chloride: 104 mmol/L (ref 98–111)
Creatinine: 0.73 mg/dL (ref 0.44–1.00)
GFR, Estimated: >60 mL/min (ref >60)
Glucose, Bld: 122 mg/dL — ABNORMAL HIGH (ref 70–99)
Potassium: 3.7 mmol/L (ref 3.5–5.1)
Sodium: 140 mmol/L (ref 135–145)
Total Bilirubin: <0.2 mg/dL — ABNORMAL LOW (ref 0.3–1.2)
Total Protein: 7.1 g/dL (ref 6.5–8.1)

## 2020-08-20 LAB — TSH: TSH: 1.056 u[IU]/mL (ref 0.308–3.960)

## 2020-08-20 MED ORDER — DIPHENHYDRAMINE HCL 50 MG/ML IJ SOLN
50.0000 mg | Freq: Once | INTRAMUSCULAR | Status: AC
  Administered 2020-08-20: 50 mg via INTRAVENOUS

## 2020-08-20 MED ORDER — FAMOTIDINE 20 MG IN NS 100 ML IVPB
INTRAVENOUS | Status: AC
  Filled 2020-08-20: qty 100

## 2020-08-20 MED ORDER — SODIUM CHLORIDE 0.9 % IV SOLN
10.0000 mg | Freq: Once | INTRAVENOUS | Status: AC
  Administered 2020-08-20: 10 mg via INTRAVENOUS
  Filled 2020-08-20: qty 10

## 2020-08-20 MED ORDER — SODIUM CHLORIDE 0.9 % IV SOLN
Freq: Once | INTRAVENOUS | Status: AC
Start: 2020-08-20 — End: 2020-08-20
  Filled 2020-08-20: qty 250

## 2020-08-20 MED ORDER — HEPARIN SOD (PORK) LOCK FLUSH 100 UNIT/ML IV SOLN
500.0000 [IU] | Freq: Once | INTRAVENOUS | Status: AC | PRN
  Administered 2020-08-20: 500 [IU]
  Filled 2020-08-20: qty 5

## 2020-08-20 MED ORDER — SODIUM CHLORIDE 0.9% FLUSH
10.0000 mL | INTRAVENOUS | Status: DC | PRN
  Administered 2020-08-20: 10 mL
  Filled 2020-08-20: qty 10

## 2020-08-20 MED ORDER — SODIUM CHLORIDE 0.9% FLUSH
10.0000 mL | Freq: Once | INTRAVENOUS | Status: AC
Start: 2020-08-20 — End: 2020-08-20
  Administered 2020-08-20: 10 mL
  Filled 2020-08-20: qty 10

## 2020-08-20 MED ORDER — SODIUM CHLORIDE 0.9 % IV SOLN
45.0000 mg/m2 | Freq: Once | INTRAVENOUS | Status: AC
  Administered 2020-08-20: 84 mg via INTRAVENOUS
  Filled 2020-08-20: qty 14

## 2020-08-20 MED ORDER — FAMOTIDINE 20 MG IN NS 100 ML IVPB
20.0000 mg | Freq: Once | INTRAVENOUS | Status: AC
Start: 2020-08-20 — End: 2020-08-20
  Administered 2020-08-20: 20 mg via INTRAVENOUS

## 2020-08-20 MED ORDER — DIPHENHYDRAMINE HCL 50 MG/ML IJ SOLN
INTRAMUSCULAR | Status: AC
  Filled 2020-08-20: qty 1

## 2020-08-20 NOTE — Progress Notes (Signed)
Per MD okay to treat with HR 108-116 and ANC 1.2.

## 2020-08-20 NOTE — Patient Instructions (Addendum)
[facility]ICAL [facility]COL[facility]   Discharge Instructions: Thank you for choosing [facility] to provide your oncology and hematology care.   If you have a lab appointment with the Cancer Center, please go directly to the Cancer Center and check in at the registration area.   Wear comfortable clothing and clothing appropriate for easy access to any Portacath or PICC line.   We strive to give you quality time with your provider. You may need to reschedule your appointment if you arrive late (15 or more minutes).  Arriving late affects you and other patients whose appointments are after yours.  Also, if you miss three or more appointments without notifying the office, you may be dismissed from the clinic at the provider's discretion.      For prescription refill requests, have your pharmacy contact our office and allow 72 hours for refills to be completed.    Today you received the following chemotherapy and/or immunotherapy agents: paclitaxel.      To help prevent nausea and vomiting after your treatment, we encourage you to take your nausea medication as directed.  BELOW ARE SYMPTOMS THAT SHOULD BE REPORTED IMMEDIATELY: *FEVER GREATER THAN 100.4 F (38 C) OR HIGHER *CHILLS OR SWEATING *NAUSEA AND VOMITING THAT IS NOT CONTROLLED WITH YOUR NAUSEA MEDICATION *UNUSUAL SHORTNESS OF BREATH *UNUSUAL BRUISING OR BLEEDING *URINARY PROBLEMS (pain or burning when urinating, or frequent urination) *BOWEL PROBLEMS (unusual diarrhea, constipation, pain near the anus) TENDERNESS IN MOUTH AND THROAT WITH OR WITHOUT PRESENCE OF ULCERS (sore throat, sores in mouth, or a toothache) UNUSUAL RASH, SWELLING OR PAIN  UNUSUAL VAGINAL DISCHARGE OR ITCHING   Items with * indicate a potential emergency and should be followed up as soon as possible or go to the Emergency Department if any problems should occur.  Please show the CHEMOTHERAPY ALERT CARD or IMMUNOTHERAPY ALERT CARD at check-in  to the Emergency Department and triage nurse.  Should you have questions after your visit or need to cancel or reschedule your appointment, please contact [facility]  Dept: [phone]  and follow the prompts.  Office hours are 8:00 a.m. to 4:30 p.m. Monday - Friday. Please note that voicemails left after 4:00 p.m. may not be returned until the following business day.  We are closed weekends and major holidays. You have access to a nurse at all times for urgent questions. Please call the main number to the clinic Dept: [phone] and follow the prompts.   For any non-urgent questions, you may also contact your provider using MyChart. We now offer e-Visits for anyone 18 and older to request care online for non-urgent symptoms. For details visit [email or web address].   Also download the MyChart app! Go to the app store, search "MyChart", open the app, select [facility], and log in with your MyChart username and password.  Due to Covid, a mask is required upon entering the hospital/clinic. If you do not have a mask, one will be given to you upon arrival. For doctor visits, patients may have 1 support person aged 18 or older with them. For treatment visits, patients cannot have anyone with them due to current Covid guidelines and our immunocompromised population.   

## 2020-08-20 NOTE — Progress Notes (Signed)
[facility] CSW Progress Note  Holiday representative met with patient in infusion to help complete supporting letters/ forms for financial assistance applications. CSW provided completed supporting documents for [employer] [place] and [employer] to patient who plans to submit full applications herself. CSW also provided [employer] applications. No further needs at this time.  CSW provided contact information should patient have any further questions or needs.   Christeen Douglas , LCSW

## 2020-08-27 ENCOUNTER — Other Ambulatory Visit: Payer: BC Managed Care – PPO

## 2020-08-27 ENCOUNTER — Ambulatory Visit: Payer: BC Managed Care – PPO

## 2020-08-27 NOTE — Assessment & Plan Note (Signed)
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 Biopsy revealed IDC grade 3, ER 0%, PR 0%, Ki-67 80%, HER-2 -1+ T1c M0 stage IB  Treatment plan: 1.Bilateral mastectomies 01/09/2020: Right mastectomy:PASH Left mastectomy: Multifocal IDC grade 3 2.2 cm largest, high-grade DCIS, margins negative, 0/4 sentinel lymph nodes, ER negative, PR negative, HER-2 negative, Ki-67 80% 2.I recommended adjuvant chemotherapy with dose dense Adriamycin andCytoxanpembrolizumabfollowed by Taxol, carboplatin and pembrolizumab 3.Adjuvant radiation ----------------------------------------------------------------------------------------------------------------------------------- Current treatment:Completed 4 cycles ofAdriamycin and Cytoxanand pembrolizumab every 3 weeks,receiving Pembrolizumab, Carbo and Taxol. Today she is due for cycle 10 of Taxol, (will also receive [place] and Pembrolizumab) Labs reviewed  Chemo toxicities: 1.Peripheral neuropathy: grade 1.  Monitoring closely.  Taxol dose reduced to 45 mg per metered squared.  Neuropathy appears to be improving. 2. Liver enzyme elevation: Monitoring labs closely, ALT is 67 today. No immune mediated adverse effects.   Return to clinic weekly for chemo and every other week for follow-up with me.

## 2020-08-27 NOTE — Progress Notes (Addendum)
Patient Care Team: Amanda Kroner, DO as PCP - General (Family Medicine) [name], RN as Oncology Nurse Navigator [name], RN as Oncology Nurse Navigator [name], MD as Consulting Physician (General Surgery) [name], MD as Consulting Physician (Hematology and Oncology) [name], MD as Consulting Physician (Radiation Oncology)  DIAGNOSIS:    ICD-10-CM   1. Malignant neoplasm of upper-outer quadrant of left breast in female, estrogen receptor negative ([place])  C50.412    Z17.1       SUMMARY OF ONCOLOGIC HISTORY: Oncology History  Malignant neoplasm of upper-outer quadrant of left breast in female, estrogen receptor negative ([place])  11/29/2019 Initial Diagnosis   Patient palpated an area of concern in the left breast. Diagnostic mammogram and US showed a 1.8cm mass in the upper outer right breast representing a fibroadenoma, and in the left breast, three adjacent masses at the 2-2:30 position, 1.7cm, 0.8cm, and 1.4cm, with several cysts and no axillary adenopathy. Biopsy showed a benign fibroadenoma in the right breast, and in the left breast, IDC at the 2:30 position, grade 3, HER-2 negative (1+), ER+ 0%, PR+ 0%, Ki67 80%.    12/04/2019 Cancer Staging   Staging form: Breast, AJCC 8th Edition - Clinical: Stage IB (cT1c, cN0, cM0, G3, ER-, PR-, HER2-) - Signed by [name], MD on 12/04/2019    12/21/2019 Genetic Testing   Negative genetic testing:  No pathogenic variants detected on the [facility] Multi-Cancer Panel. A variant of uncertain significance (VUS) was detected in the CDK4 gene called c.415C>T (p.Arg139*). The report date is 12/21/2019.  The Multi-Cancer Panel offered by [facility] includes sequencing and/or deletion duplication testing of the following 85 genes: AIP, ALK, APC, ATM, AXIN2,BAP1,  BARD1, BLM, BMPR1A, BRCA1, BRCA2, BRIP1, CASR, CDC73, CDH1, CDK4, CDKN1B, CDKN1C, CDKN2A (p14ARF), CDKN2A (p16INK4a), CEBPA, CHEK2, CTNNA1, DICER1, DIS3L2, EGFR  (c.2369C>T, p.Thr790Met variant only), EPCAM (Deletion/duplication testing only), FH, FLCN, GATA2, GPC3, GREM1 (Promoter region deletion/duplication testing only), HOXB13 (c.251G>A, p.Gly84Glu), HRAS, KIT, MAX, MEN1, MET, MITF (c.952G>A, p.Glu318Lys variant only), MLH1, MSH2, MSH3, MSH6, MUTYH, NBN, NF1, NF2, NTHL1, PALB2, PDGFRA, PHOX2B, PMS2, POLD1, POLE, POT1, PRKAR1A, PTCH1, PTEN, RAD50, RAD51C, RAD51D, RB1, RECQL4, RET, RNF43, RUNX1, SDHAF2, SDHA (sequence changes only), SDHB, SDHC, SDHD, SMAD4, SMARCA4, SMARCB1, SMARCE1, STK11, SUFU, TERC, TERT, TMEM127, TP53, TSC1, TSC2, VHL, WRN and WT1.    01/09/2020 Surgery   Bilateral mastectomies with reconstruction [facility]):  Right breast: no evidence of malignancy Left breast: multifocal invasive ductal carcinoma, grade 3, largest 2.2cm, with high grade DCIS, clear margins, 4 left axillary lymph nodes negative for carcinoma.   03/18/2020 -  Chemotherapy    Patient is on Treatment Plan: BREAST AC Q21D / CARBOPLATIN D1 + PACLITAXEL D1,8,15 Q21D         CHIEF COMPLIANT: Cycle 8 Taxol, carbo and pembrolizumab  INTERVAL HISTORY: Amanda Burns is a 40 y.o. with above-mentioned history of  history of left breast cancer who underwent bilateral mastectomies with reconstruction and is currently on adjuvant chemotherapy with Taxol, carbo and pembrolizumab after completing 4 cycles of Adriamycin and Cytoxan. She presents to the clinic today for a toxicity check and treatment.   ALLERGIES:  is allergic to adhesive [tape], benzoyl peroxide, and hydrocodone.  MEDICATIONS:  Current Outpatient Medications  Medication Sig Dispense Refill   acetaminophen (TYLENOL) 500 MG tablet Take 1,000 mg by mouth every 6 (six) hours as needed for moderate pain or headache.     amphetamine-dextroamphetamine (ADDERALL XR) 20 MG 24 hr  capsule Take 20 mg by mouth daily.      bismuth subsalicylate (PEPTO BISMOL) 262 MG/15ML suspension Take 30 mLs by mouth every 6 (six)  hours as needed for indigestion or diarrhea or loose stools.     buPROPion (WELLBUTRIN XL) 150 MG 24 hr tablet Take 150 mg by mouth daily.     escitalopram (LEXAPRO) 20 MG tablet Take 20 mg by mouth daily.     fluticasone (FLONASE) 50 MCG/ACT nasal spray Place 1 spray into both nostrils daily as needed for allergies or rhinitis.     ibuprofen (ADVIL) 200 MG tablet Take 400 mg by mouth every 6 (six) hours as needed for headache or moderate pain.     Ibuprofen-diphenhydrAMINE HCl (IBUPROFEN PM) 200-25 MG CAPS Take 2 tablets by mouth at bedtime as needed (sleep).     lidocaine-prilocaine (EMLA) cream Apply to affected area once (Patient taking differently: Apply 1 application topically daily as needed (port access).) 30 g 3   LORazepam (ATIVAN) 0.5 MG tablet Take 1 tablet (0.5 mg total) by mouth at bedtime as needed for sleep. 30 tablet 0   Multiple Vitamins-Minerals (MULTIVITAMIN WITH MINERALS) tablet Take 1 tablet by mouth daily.     omeprazole (PRILOSEC) 20 MG capsule Take 40 mg by mouth daily.     ondansetron (ZOFRAN) 8 MG tablet Take 1 tablet (8 mg total) by mouth 2 (two) times daily as needed. Start on the third day after chemotherapy. 30 tablet 1   prochlorperazine (COMPAZINE) 10 MG tablet Take 1 tablet (10 mg total) by mouth every 6 (six) hours as needed (Nausea or vomiting). 30 tablet 1   No current facility-administered medications for this visit.   Facility-Administered Medications Ordered in Other Visits  Medication Dose Route Frequency Provider Last Rate Last Admin   sodium chloride flush (NS) 0.9 % injection 10 mL  10 mL Intravenous PRN [name], MD   10 mL at 03/18/20 1033    PHYSICAL EXAMINATION: ECOG PERFORMANCE STATUS: 1 - Symptomatic but completely ambulatory  There were no vitals filed for this visit. There were no vitals filed for this visit.  LABORATORY DATA:  I have reviewed the data as listed CMP Latest Ref Rng & Units 08/20/2020 08/13/2020 08/06/2020  Glucose 70 -  99 mg/dL 122(H) 108(H) 113(H)  BUN 6 - 20 mg/dL _0 Creatinine 0.44 - 1.00 mg/dL 0.73 0.67 0.66  Sodium 135 - 145 mmol/L 140 141 140  Potassium 3.5 - 5.1 mmol/L 3.7 3.9 3.8  Chloride 98 - 111 mmol/L 104 104 105  CO2 22 - 32 mmol/L _1 Calcium 8.9 - 10.3 mg/dL 9.4 9.7 9.1  Total Protein 6.5 - 8.1 g/dL 7.1 7.3 7.0  Total Bilirubin 0.3 - 1.2 mg/dL <0.2(L) <0.2(L) <0.2(L)  Alkaline Phos 38 - 126 U/L 107 130(H) 99  AST 15 - 41 U/L 35 37 27  ALT 0 - 44 U/L 50(H) 67(H) 41    Lab Results  Component Value Date   WBC 1.8 (L) 08/20/2020   HGB 8.3 (L) 08/20/2020   HCT 24.6 (L) 08/20/2020   MCV 82.0 08/20/2020   PLT 202 08/20/2020   NEUTROABS 1.2 (L) 08/20/2020    ASSESSMENT & PLAN:  Malignant neoplasm of upper-outer quadrant of left breast in female, estrogen receptor negative ([facility]) Palpable left breast mass: 1.8 cm UOQ fibroadenoma in the right breast, left breast 3 masses 2 to 2:30 position: 1.7, 1.8, 1.4 cm.  Biopsy revealed IDC grade 3, ER  0%, PR 0%, Ki-67 80%, HER-2 -1+ T1c M0 stage IB   Treatment plan: 1.    Bilateral mastectomies 01/09/2020: Right mastectomy:PASH Left mastectomy: Multifocal IDC grade 3 2.2 cm largest, high-grade DCIS, margins negative, 0/4 sentinel lymph nodes, ER negative, PR negative, HER-2 negative, Ki-67 80% 2.  I recommended adjuvant chemotherapy with dose dense Adriamycin and Cytoxan pembrolizumab followed by Taxol, carboplatin and pembrolizumab   ----------------------------------------------------------------------------------------------------------------------------------- Current treatment: Completed 4 cycles of Adriamycin and Cytoxan and pembrolizumab every 3 weeks, receiving Pembrolizumab, Carbo and Taxol.  Today she is due for cycle 10 of Taxol, (will also receive [place] and Pembrolizumab) Labs reviewed   Chemo toxicities: 1.  Peripheral neuropathy: Worsening neuropathy: We decided to discontinue Taxol after today's treatment.  I will cancel  the next couple of appointments for weekly Taxol.  She will come back on 09/17/2020 for Keytruda alone. 2. Liver enzyme elevation: Monitoring labs closely 3.  Tachycardia and low blood pressure: Related to anemia: We discussed the pros and cons of giving blood transfusion and decided to hold off on it at this time.  Okay to treat with a heart rate of 125 and a blood pressure of 90/50.   No immune mediated adverse effects.   Return to clinic weekly for chemo and every other week for follow-up with me.      No orders of the defined types were placed in this encounter.  The patient has a good understanding of the overall plan. she agrees with it. she will call with any problems that may develop before the next visit here.  Total time spent: 30 mins including face to face time and time spent for planning, charting and coordination of care  Rulon Eisenmenger, MD, MPH 08/28/2020  I, Thana Ates, am acting as scribe for Dr. [name].  I have reviewed the above documentation for accuracy and completeness, and I agree with the above.

## 2020-08-28 ENCOUNTER — Inpatient Hospital Stay (HOSPITAL_BASED_OUTPATIENT_CLINIC_OR_DEPARTMENT_OTHER): Payer: BC Managed Care – PPO | Admitting: Hematology and Oncology

## 2020-08-28 ENCOUNTER — Other Ambulatory Visit: Payer: Self-pay

## 2020-08-28 ENCOUNTER — Encounter: Payer: Self-pay | Admitting: Hematology and Oncology

## 2020-08-28 ENCOUNTER — Telehealth: Payer: Self-pay

## 2020-08-28 ENCOUNTER — Encounter: Payer: Self-pay | Admitting: *Deleted

## 2020-08-28 ENCOUNTER — Inpatient Hospital Stay: Payer: BC Managed Care – PPO

## 2020-08-28 VITALS — BP 131/92 | HR 112

## 2020-08-28 DIAGNOSIS — Z95828 Presence of other vascular implants and grafts: Secondary | ICD-10-CM

## 2020-08-28 DIAGNOSIS — Z5112 Encounter for antineoplastic immunotherapy: Secondary | ICD-10-CM | POA: Diagnosis not present

## 2020-08-28 DIAGNOSIS — Z171 Estrogen receptor negative status [ER-]: Secondary | ICD-10-CM

## 2020-08-28 DIAGNOSIS — C50412 Malignant neoplasm of upper-outer quadrant of left female breast: Secondary | ICD-10-CM

## 2020-08-28 LAB — CMP (CANCER CENTER ONLY)
ALT: 88 U/L — ABNORMAL HIGH (ref 0–44)
AST: 59 U/L — ABNORMAL HIGH (ref 15–41)
Albumin: 3.9 g/dL (ref 3.5–5.0)
Alkaline Phosphatase: 127 U/L — ABNORMAL HIGH (ref 38–126)
Anion gap: 8 (ref 5–15)
BUN: 11 mg/dL (ref 6–20)
CO2: 27 mmol/L (ref 22–32)
Calcium: 9.4 mg/dL (ref 8.9–10.3)
Chloride: 102 mmol/L (ref 98–111)
Creatinine: 0.85 mg/dL (ref 0.44–1.00)
GFR, Estimated: >60 mL/min (ref >60)
Glucose, Bld: 143 mg/dL — ABNORMAL HIGH (ref 70–99)
Potassium: 3.5 mmol/L (ref 3.5–5.1)
Sodium: 137 mmol/L (ref 135–145)
Total Bilirubin: 0.3 mg/dL (ref 0.3–1.2)
Total Protein: 7.5 g/dL (ref 6.5–8.1)

## 2020-08-28 LAB — CBC WITH DIFFERENTIAL (CANCER CENTER ONLY)
Abs Immature Granulocytes: 0.02 10*3/uL (ref 0.00–0.07)
Basophils Absolute: 0 10*3/uL (ref 0.0–0.1)
Basophils Relative: 0 %
Eosinophils Absolute: 0 10*3/uL (ref 0.0–0.5)
Eosinophils Relative: 0 %
HCT: 24.6 % — ABNORMAL LOW (ref 36.0–46.0)
Hemoglobin: 8.4 g/dL — ABNORMAL LOW (ref 12.0–15.0)
Immature Granulocytes: 1 %
Lymphocytes Relative: 25 %
Lymphs Abs: 0.6 10*3/uL — ABNORMAL LOW (ref 0.7–4.0)
MCH: 28 pg (ref 26.0–34.0)
MCHC: 34.1 g/dL (ref 30.0–36.0)
MCV: 82 fL (ref 80.0–100.0)
Monocytes Absolute: 0.2 10*3/uL (ref 0.1–1.0)
Monocytes Relative: 11 %
Neutro Abs: 1.4 10*3/uL — ABNORMAL LOW (ref 1.7–7.7)
Neutrophils Relative %: 63 %
Platelet Count: 188 10*3/uL (ref 150–400)
RBC: 3 MIL/uL — ABNORMAL LOW (ref 3.87–5.11)
RDW: 19.4 % — ABNORMAL HIGH (ref 11.5–15.5)
WBC Count: 2.3 10*3/uL — ABNORMAL LOW (ref 4.0–10.5)
nRBC: 0 % (ref 0.0–0.2)

## 2020-08-28 LAB — TSH: TSH: 0.988 u[IU]/mL (ref 0.350–4.500)

## 2020-08-28 MED ORDER — SODIUM CHLORIDE 0.9% FLUSH
10.0000 mL | INTRAVENOUS | Status: DC | PRN
  Administered 2020-08-28: 10 mL
  Filled 2020-08-28: qty 10

## 2020-08-28 MED ORDER — DIPHENHYDRAMINE HCL 50 MG/ML IJ SOLN
50.0000 mg | Freq: Once | INTRAMUSCULAR | Status: AC
Start: 2020-08-28 — End: 2020-08-28
  Administered 2020-08-28: 50 mg via INTRAVENOUS

## 2020-08-28 MED ORDER — SODIUM CHLORIDE 0.9 % IV SOLN
45.0000 mg/m2 | Freq: Once | INTRAVENOUS | Status: AC
  Administered 2020-08-28: 84 mg via INTRAVENOUS
  Filled 2020-08-28: qty 14

## 2020-08-28 MED ORDER — SODIUM CHLORIDE 0.9 % IV SOLN
10.0000 mg | Freq: Once | INTRAVENOUS | Status: AC
  Administered 2020-08-28: 10 mg via INTRAVENOUS
  Filled 2020-08-28: qty 10

## 2020-08-28 MED ORDER — DIPHENHYDRAMINE HCL 50 MG/ML IJ SOLN
INTRAMUSCULAR | Status: AC
  Filled 2020-08-28: qty 1

## 2020-08-28 MED ORDER — SODIUM CHLORIDE 0.9% FLUSH
10.0000 mL | Freq: Once | INTRAVENOUS | Status: AC
Start: 2020-08-28 — End: 2020-08-28
  Administered 2020-08-28: 10 mL
  Filled 2020-08-28: qty 10

## 2020-08-28 MED ORDER — SODIUM CHLORIDE 0.9 % IV SOLN
200.0000 mg | Freq: Once | INTRAVENOUS | Status: AC
  Administered 2020-08-28: 200 mg via INTRAVENOUS
  Filled 2020-08-28: qty 8

## 2020-08-28 MED ORDER — FAMOTIDINE 20 MG IN NS 100 ML IVPB
20.0000 mg | Freq: Once | INTRAVENOUS | Status: AC
  Administered 2020-08-28: 20 mg via INTRAVENOUS

## 2020-08-28 MED ORDER — FAMOTIDINE 20 MG IN NS 100 ML IVPB
INTRAVENOUS | Status: AC
  Filled 2020-08-28: qty 100

## 2020-08-28 MED ORDER — HEPARIN SOD (PORK) LOCK FLUSH 100 UNIT/ML IV SOLN
500.0000 [IU] | Freq: Once | INTRAVENOUS | Status: AC | PRN
  Administered 2020-08-28: 500 [IU]
  Filled 2020-08-28: qty 5

## 2020-08-28 MED ORDER — SODIUM CHLORIDE 0.9 % IV SOLN
Freq: Once | INTRAVENOUS | Status: AC
  Filled 2020-08-28: qty 250

## 2020-08-28 NOTE — Progress Notes (Signed)
[name] made aware of labs ALT and ANC, along with HR, okay to proceed.

## 2020-08-28 NOTE — Progress Notes (Signed)
Met with patient at registration to obtain income for [employer].  Patient approved for one-time $1000 [employer] grant to assist with personal expenses while going through treatment. Discussed in detail expenses and how they are covered. Gave her a copy of the approval letter and expense sheet along with the Outpatient pharmacy information.  She received 2 gift cards today from her grant.  She has my card for any additional financial questions or concerns.

## 2020-08-28 NOTE — Telephone Encounter (Signed)
This LPN called pt regarding mychart message. This LPN explained that Dr Geralyn Flash note from today reflects that he will get radiation consult. Pt states she was unaware of this and is not interested in this approach and would like to speak to Dr Lindi Adie regarding this. Informed pt I would relay message to Dr Lindi Adie. Pt verbalized thanks and understanding.

## 2020-08-31 ENCOUNTER — Telehealth: Payer: Self-pay | Admitting: *Deleted

## 2020-08-31 ENCOUNTER — Encounter: Payer: Self-pay | Admitting: *Deleted

## 2020-08-31 NOTE — Telephone Encounter (Signed)
Left message to let patient know that she does not need radiation consult.

## 2020-09-01 ENCOUNTER — Encounter: Payer: Self-pay | Admitting: *Deleted

## 2020-09-04 ENCOUNTER — Ambulatory Visit: Payer: BC Managed Care – PPO

## 2020-09-04 ENCOUNTER — Ambulatory Visit: Payer: BC Managed Care – PPO | Admitting: Adult Health

## 2020-09-04 ENCOUNTER — Other Ambulatory Visit: Payer: BC Managed Care – PPO

## 2020-09-11 ENCOUNTER — Ambulatory Visit: Payer: BC Managed Care – PPO

## 2020-09-11 ENCOUNTER — Ambulatory Visit: Payer: BC Managed Care – PPO | Admitting: Hematology and Oncology

## 2020-09-11 ENCOUNTER — Other Ambulatory Visit: Payer: BC Managed Care – PPO

## 2020-09-17 ENCOUNTER — Inpatient Hospital Stay: Payer: BC Managed Care – PPO | Attending: Hematology and Oncology

## 2020-09-17 ENCOUNTER — Inpatient Hospital Stay: Payer: BC Managed Care – PPO | Admitting: Hematology and Oncology

## 2020-09-17 ENCOUNTER — Other Ambulatory Visit: Payer: Self-pay

## 2020-09-17 ENCOUNTER — Other Ambulatory Visit: Payer: Self-pay | Admitting: Hematology and Oncology

## 2020-09-17 ENCOUNTER — Inpatient Hospital Stay: Payer: BC Managed Care – PPO

## 2020-09-17 VITALS — BP 115/77 | HR 90 | Temp 98.4°F | Resp 15

## 2020-09-17 DIAGNOSIS — R7401 Elevation of levels of liver transaminase levels: Secondary | ICD-10-CM | POA: Insufficient documentation

## 2020-09-17 DIAGNOSIS — Z9013 Acquired absence of bilateral breasts and nipples: Secondary | ICD-10-CM | POA: Insufficient documentation

## 2020-09-17 DIAGNOSIS — D241 Benign neoplasm of right breast: Secondary | ICD-10-CM | POA: Insufficient documentation

## 2020-09-17 DIAGNOSIS — Z803 Family history of malignant neoplasm of breast: Secondary | ICD-10-CM | POA: Diagnosis not present

## 2020-09-17 DIAGNOSIS — T451X5D Adverse effect of antineoplastic and immunosuppressive drugs, subsequent encounter: Secondary | ICD-10-CM | POA: Diagnosis not present

## 2020-09-17 DIAGNOSIS — E039 Hypothyroidism, unspecified: Secondary | ICD-10-CM | POA: Insufficient documentation

## 2020-09-17 DIAGNOSIS — Z95828 Presence of other vascular implants and grafts: Secondary | ICD-10-CM

## 2020-09-17 DIAGNOSIS — D649 Anemia, unspecified: Secondary | ICD-10-CM | POA: Insufficient documentation

## 2020-09-17 DIAGNOSIS — Z171 Estrogen receptor negative status [ER-]: Secondary | ICD-10-CM | POA: Insufficient documentation

## 2020-09-17 DIAGNOSIS — Z5112 Encounter for antineoplastic immunotherapy: Secondary | ICD-10-CM | POA: Insufficient documentation

## 2020-09-17 DIAGNOSIS — C50412 Malignant neoplasm of upper-outer quadrant of left female breast: Secondary | ICD-10-CM | POA: Insufficient documentation

## 2020-09-17 DIAGNOSIS — Z79899 Other long term (current) drug therapy: Secondary | ICD-10-CM | POA: Insufficient documentation

## 2020-09-17 DIAGNOSIS — Z5111 Encounter for antineoplastic chemotherapy: Secondary | ICD-10-CM | POA: Insufficient documentation

## 2020-09-17 DIAGNOSIS — G620 Drug-induced polyneuropathy: Secondary | ICD-10-CM | POA: Diagnosis not present

## 2020-09-17 LAB — CBC WITH DIFFERENTIAL/PLATELET
Abs Immature Granulocytes: 0.01 10*3/uL (ref 0.00–0.07)
Basophils Absolute: 0 10*3/uL (ref 0.0–0.1)
Basophils Relative: 1 %
Eosinophils Absolute: 0 10*3/uL (ref 0.0–0.5)
Eosinophils Relative: 1 %
HCT: 28.3 % — ABNORMAL LOW (ref 36.0–46.0)
Hemoglobin: 9.4 g/dL — ABNORMAL LOW (ref 12.0–15.0)
Immature Granulocytes: 0 %
Lymphocytes Relative: 27 %
Lymphs Abs: 1 10*3/uL (ref 0.7–4.0)
MCH: 28.5 pg (ref 26.0–34.0)
MCHC: 33.2 g/dL (ref 30.0–36.0)
MCV: 85.8 fL (ref 80.0–100.0)
Monocytes Absolute: 0.5 10*3/uL (ref 0.1–1.0)
Monocytes Relative: 13 %
Neutro Abs: 2 10*3/uL (ref 1.7–7.7)
Neutrophils Relative %: 58 %
Platelets: 395 10*3/uL (ref 150–400)
RBC: 3.3 MIL/uL — ABNORMAL LOW (ref 3.87–5.11)
RDW: 19.8 % — ABNORMAL HIGH (ref 11.5–15.5)
WBC: 3.5 10*3/uL — ABNORMAL LOW (ref 4.0–10.5)
nRBC: 0 % (ref 0.0–0.2)

## 2020-09-17 LAB — CMP (CANCER CENTER ONLY)
ALT: 47 U/L — ABNORMAL HIGH (ref 0–44)
AST: 28 U/L (ref 15–41)
Albumin: 3.8 g/dL (ref 3.5–5.0)
Alkaline Phosphatase: 156 U/L — ABNORMAL HIGH (ref 38–126)
Anion gap: 9 (ref 5–15)
BUN: 9 mg/dL (ref 6–20)
CO2: 30 mmol/L (ref 22–32)
Calcium: 10 mg/dL (ref 8.9–10.3)
Chloride: 103 mmol/L (ref 98–111)
Creatinine: 0.72 mg/dL (ref 0.44–1.00)
GFR, Estimated: >60 mL/min (ref >60)
Glucose, Bld: 103 mg/dL — ABNORMAL HIGH (ref 70–99)
Potassium: 3.8 mmol/L (ref 3.5–5.1)
Sodium: 142 mmol/L (ref 135–145)
Total Bilirubin: 0.2 mg/dL — ABNORMAL LOW (ref 0.3–1.2)
Total Protein: 7.9 g/dL (ref 6.5–8.1)

## 2020-09-17 MED ORDER — SODIUM CHLORIDE 0.9% FLUSH
10.0000 mL | Freq: Once | INTRAVENOUS | Status: AC
  Administered 2020-09-17: 10 mL
  Filled 2020-09-17: qty 10

## 2020-09-17 MED ORDER — SODIUM CHLORIDE 0.9 % IV SOLN
Freq: Once | INTRAVENOUS | Status: AC
Start: 2020-09-17 — End: 2020-09-17
  Filled 2020-09-17: qty 250

## 2020-09-17 MED ORDER — SODIUM CHLORIDE 0.9% FLUSH
10.0000 mL | INTRAVENOUS | Status: DC | PRN
  Administered 2020-09-17: 10 mL
  Filled 2020-09-17: qty 10

## 2020-09-17 MED ORDER — HEPARIN SOD (PORK) LOCK FLUSH 100 UNIT/ML IV SOLN
500.0000 [IU] | Freq: Once | INTRAVENOUS | Status: AC | PRN
  Administered 2020-09-17: 500 [IU]
  Filled 2020-09-17: qty 5

## 2020-09-17 MED ORDER — SODIUM CHLORIDE 0.9 % IV SOLN
200.0000 mg | Freq: Once | INTRAVENOUS | Status: AC
  Administered 2020-09-17: 200 mg via INTRAVENOUS
  Filled 2020-09-17: qty 8

## 2020-09-17 NOTE — Assessment & Plan Note (Deleted)
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 Biopsy revealed IDC grade 3, ER 0%, PR 0%, Ki-67 80%, HER-2 -1+ T1c M0 stage IB  Treatment plan: 1.Bilateral mastectomies 01/09/2020: Right mastectomy:PASH Left mastectomy: Multifocal IDC grade 3 2.2 cm largest, high-grade DCIS, margins negative, 0/4 sentinel lymph nodes, ER negative, PR negative, HER-2 negative, Ki-67 80% 2.I recommended adjuvant chemotherapy with dose dense Adriamycin andCytoxanpembrolizumabfollowed by Taxol, carboplatin and pembrolizumab   ----------------------------------------------------------------------------------------------------------------------------------- Current treatment:Completed 4 cycles ofAdriamycin and Cytoxanand pembrolizumab every 3 weeks,receiving Pembrolizumab, Carbo and Taxol. Today she is due forcycle 11 ofTaxol, (will also receivecarbo and Pembrolizumab) Labs reviewed  Chemo toxicities: 1.Peripheral neuropathy: Worsening neuropathy: We decided to discontinue Taxol after today's treatment.  I will cancel the next couple of appointments for weekly Taxol.  She will come back on 09/17/2020 for Keytruda alone. 2. Liver enzyme elevation:Monitoring labs closely 3.  Tachycardia and low blood pressure: Related to anemia: We discussed the pros and cons of giving blood transfusion and decided to hold off on it at this time.  Okay to treat with a heart rate of 125 and a blood pressure of 90/50.   No immune mediated adverse effects.   Return to clinic weekly for chemo and every other week for follow-up with me.

## 2020-09-17 NOTE — Patient Instructions (Signed)
[facility] MEDICAL [facility]COL[facility]  Discharge Instructions: Thank you for choosing [facility] to provide your oncology and hematology care.   If you have a lab appointment with the Cancer Center, please go directly to the Cancer Center and check in at the registration area.   Wear comfortable clothing and clothing appropriate for easy access to any Portacath or PICC line.   We strive to give you quality time with your provider. You may need to reschedule your appointment if you arrive late (15 or more minutes).  Arriving late affects you and other patients whose appointments are after yours.  Also, if you miss three or more appointments without notifying the office, you may be dismissed from the clinic at the provider's discretion.      For prescription refill requests, have your pharmacy contact our office and allow 72 hours for refills to be completed.    Today you received the following chemotherapy and/or immunotherapy agents Keytruda      To help prevent nausea and vomiting after your treatment, we encourage you to take your nausea medication as directed.  BELOW ARE SYMPTOMS THAT SHOULD BE REPORTED IMMEDIATELY: *FEVER GREATER THAN 100.4 F (38 C) OR HIGHER *CHILLS OR SWEATING *NAUSEA AND VOMITING THAT IS NOT CONTROLLED WITH YOUR NAUSEA MEDICATION *UNUSUAL SHORTNESS OF BREATH *UNUSUAL BRUISING OR BLEEDING *URINARY PROBLEMS (pain or burning when urinating, or frequent urination) *BOWEL PROBLEMS (unusual diarrhea, constipation, pain near the anus) TENDERNESS IN MOUTH AND THROAT WITH OR WITHOUT PRESENCE OF ULCERS (sore throat, sores in mouth, or a toothache) UNUSUAL RASH, SWELLING OR PAIN  UNUSUAL VAGINAL DISCHARGE OR ITCHING   Items with * indicate a potential emergency and should be followed up as soon as possible or go to the Emergency Department if any problems should occur.  Please show the CHEMOTHERAPY ALERT CARD or IMMUNOTHERAPY ALERT CARD at check-in to  the Emergency Department and triage nurse.  Should you have questions after your visit or need to cancel or reschedule your appointment, please contact [facility]  Dept: [phone]  and follow the prompts.  Office hours are 8:00 a.m. to 4:30 p.m. Monday - Friday. Please note that voicemails left after 4:00 p.m. may not be returned until the following business day.  We are closed weekends and major holidays. You have access to a nurse at all times for urgent questions. Please call the main number to the clinic Dept: [phone] and follow the prompts.   For any non-urgent questions, you may also contact your provider using MyChart. We now offer e-Visits for anyone 18 and older to request care online for non-urgent symptoms. For details visit [email or web address].   Also download the MyChart app! Go to the app store, search "MyChart", open the app, select [facility], and log in with your MyChart username and password.  Due to Covid, a mask is required upon entering the hospital/clinic. If you do not have a mask, one will be given to you upon arrival. For doctor visits, patients may have 1 support person aged 18 or older with them. For treatment visits, patients cannot have anyone with them due to current Covid guidelines and our immunocompromised population.   Pembrolizumab injection What is this medication? PEMBROLIZUMAB (pem broe liz ue mab) is a monoclonal antibody. It is used totreat certain types of cancer. This medicine may be used for other purposes; ask your health care provider orpharmacist if you have questions. COMMON BRAND NAME(S): Keytruda What should I   tell my care team before I take this medication? They need to know if you have any of these conditions: autoimmune diseases like Crohn's disease, ulcerative colitis, or lupus have had or planning to have an allogeneic stem cell transplant (uses someone else's stem cells) history of organ  transplant history of chest radiation nervous system problems like myasthenia gravis or Guillain-Barre syndrome an unusual or allergic reaction to pembrolizumab, other medicines, foods, dyes, or preservatives pregnant or trying to get pregnant breast-feeding How should I use this medication? This medicine is for infusion into a vein. It is given by a health careprofessional in a hospital or clinic setting. A special MedGuide will be given to you before each treatment. Be sure to readthis information carefully each time. Talk to your pediatrician regarding the use of this medicine in children. While this drug may be prescribed for children as young as 6 months for selectedconditions, precautions do apply. Overdosage: If you think you have taken too much of this medicine contact apoison control center or emergency room at once. NOTE: This medicine is only for you. Do not share this medicine with others. What if I miss a dose? It is important not to miss your dose. Call your doctor or health careprofessional if you are unable to keep an appointment. What may interact with this medication? Interactions have not been studied. This list may not describe all possible interactions. Give your health care provider a list of all the medicines, herbs, non-prescription drugs, or dietary supplements you use. Also tell them if you smoke, drink alcohol, or use illegaldrugs. Some items may interact with your medicine. What should I watch for while using this medication? Your condition will be monitored carefully while you are receiving thismedicine. You may need blood work done while you are taking this medicine. Do not become pregnant while taking this medicine or for 4 months after stopping it. Women should inform their doctor if they wish to become pregnant or think they might be pregnant. There is a potential for serious side effects to an unborn child. Talk to your health care professional or pharmacist for  more information. Do not breast-feed an infant while taking this medicine orfor 4 months after the last dose. What side effects may I notice from receiving this medication? Side effects that you should report to your doctor or health care professionalas soon as possible: allergic reactions like skin rash, itching or hives, swelling of the face, lips, or tongue bloody or black, tarry breathing problems changes in vision chest pain chills confusion constipation cough diarrhea dizziness or feeling faint or lightheaded fast or irregular heartbeat fever flushing joint pain low blood counts - this medicine may decrease the number of white blood cells, red blood cells and platelets. You may be at increased risk for infections and bleeding. muscle pain muscle weakness pain, tingling, numbness in the hands or feet persistent headache redness, blistering, peeling or loosening of the skin, including inside the mouth signs and symptoms of high blood sugar such as dizziness; dry mouth; dry skin; fruity breath; nausea; stomach pain; increased hunger or thirst; increased urination signs and symptoms of kidney injury like trouble passing urine or change in the amount of urine signs and symptoms of liver injury like dark urine, light-colored stools, loss of appetite, nausea, right upper belly pain, yellowing of the eyes or skin sweating swollen lymph nodes weight loss Side effects that usually do not require medical attention (report to yourdoctor or health care professional if they continue   or are bothersome): decreased appetite hair loss tiredness This list may not describe all possible side effects. Call your doctor for medical advice about side effects. You may report side effects to FDA at1-800-FDA-1088. Where should I keep my medication? This drug is given in a hospital or clinic and will not be stored at home. NOTE: This sheet is a summary. It may not cover all possible information. If you  have questions about this medicine, talk to your doctor, pharmacist, orhealth care provider.  2022 Elsevier/Gold Standard (2019-01-30 21:44:53)   

## 2020-09-17 NOTE — Patient Instructions (Signed)

## 2020-09-18 ENCOUNTER — Other Ambulatory Visit: Payer: BC Managed Care – PPO

## 2020-09-22 ENCOUNTER — Telehealth: Payer: Self-pay | Admitting: Adult Health

## 2020-09-22 NOTE — Telephone Encounter (Signed)
Scheduled appointment per 07/12 sch msg. Left message 

## 2020-09-23 ENCOUNTER — Encounter: Payer: Self-pay | Admitting: *Deleted

## 2020-10-08 ENCOUNTER — Encounter: Payer: Self-pay | Admitting: *Deleted

## 2020-10-09 ENCOUNTER — Inpatient Hospital Stay: Payer: BC Managed Care – PPO

## 2020-10-09 ENCOUNTER — Ambulatory Visit: Payer: BC Managed Care – PPO

## 2020-10-09 ENCOUNTER — Ambulatory Visit: Payer: BC Managed Care – PPO | Admitting: Adult Health

## 2020-10-09 ENCOUNTER — Inpatient Hospital Stay (HOSPITAL_BASED_OUTPATIENT_CLINIC_OR_DEPARTMENT_OTHER): Payer: BC Managed Care – PPO | Admitting: Adult Health

## 2020-10-09 ENCOUNTER — Other Ambulatory Visit: Payer: BC Managed Care – PPO

## 2020-10-09 ENCOUNTER — Encounter: Payer: Self-pay | Admitting: Adult Health

## 2020-10-09 ENCOUNTER — Other Ambulatory Visit: Payer: Self-pay

## 2020-10-09 VITALS — BP 126/75 | HR 105 | Temp 97.1°F | Resp 18 | Ht 65.0 in | Wt 166.3 lb

## 2020-10-09 VITALS — HR 94

## 2020-10-09 DIAGNOSIS — Z171 Estrogen receptor negative status [ER-]: Secondary | ICD-10-CM

## 2020-10-09 DIAGNOSIS — Z5112 Encounter for antineoplastic immunotherapy: Secondary | ICD-10-CM | POA: Diagnosis not present

## 2020-10-09 DIAGNOSIS — C50412 Malignant neoplasm of upper-outer quadrant of left female breast: Secondary | ICD-10-CM

## 2020-10-09 DIAGNOSIS — Z95828 Presence of other vascular implants and grafts: Secondary | ICD-10-CM

## 2020-10-09 LAB — CMP (CANCER CENTER ONLY)
ALT: 39 U/L (ref 0–44)
AST: 27 U/L (ref 15–41)
Albumin: 4 g/dL (ref 3.5–5.0)
Alkaline Phosphatase: 121 U/L (ref 38–126)
Anion gap: 9 (ref 5–15)
BUN: 11 mg/dL (ref 6–20)
CO2: 26 mmol/L (ref 22–32)
Calcium: 9.6 mg/dL (ref 8.9–10.3)
Chloride: 106 mmol/L (ref 98–111)
Creatinine: 0.84 mg/dL (ref 0.44–1.00)
GFR, Estimated: >60 mL/min (ref >60)
Glucose, Bld: 114 mg/dL — ABNORMAL HIGH (ref 70–99)
Potassium: 3.9 mmol/L (ref 3.5–5.1)
Sodium: 141 mmol/L (ref 135–145)
Total Bilirubin: 0.3 mg/dL (ref 0.3–1.2)
Total Protein: 7.5 g/dL (ref 6.5–8.1)

## 2020-10-09 LAB — CBC (CANCER CENTER ONLY)
HCT: 29.7 % — ABNORMAL LOW (ref 36.0–46.0)
Hemoglobin: 9.8 g/dL — ABNORMAL LOW (ref 12.0–15.0)
MCH: 28.4 pg (ref 26.0–34.0)
MCHC: 33 g/dL (ref 30.0–36.0)
MCV: 86.1 fL (ref 80.0–100.0)
Platelet Count: 354 10*3/uL (ref 150–400)
RBC: 3.45 MIL/uL — ABNORMAL LOW (ref 3.87–5.11)
RDW: 16.1 % — ABNORMAL HIGH (ref 11.5–15.5)
WBC Count: 4.6 10*3/uL (ref 4.0–10.5)
nRBC: 0 % (ref 0.0–0.2)

## 2020-10-09 MED ORDER — SODIUM CHLORIDE 0.9 % IV SOLN
Freq: Once | INTRAVENOUS | Status: AC
  Filled 2020-10-09: qty 250

## 2020-10-09 MED ORDER — PEMBROLIZUMAB CHEMO INJECTION 100 MG/4ML
200.0000 mg | Freq: Once | INTRAVENOUS | Status: AC
  Administered 2020-10-09: 200 mg via INTRAVENOUS
  Filled 2020-10-09: qty 8

## 2020-10-09 MED ORDER — HEPARIN SOD (PORK) LOCK FLUSH 100 UNIT/ML IV SOLN
500.0000 [IU] | Freq: Once | INTRAVENOUS | Status: AC | PRN
  Administered 2020-10-09: 500 [IU]
  Filled 2020-10-09: qty 5

## 2020-10-09 MED ORDER — SODIUM CHLORIDE 0.9% FLUSH
10.0000 mL | Freq: Once | INTRAVENOUS | Status: AC
Start: 2020-10-09 — End: 2020-10-09
  Administered 2020-10-09: 10 mL
  Filled 2020-10-09: qty 10

## 2020-10-09 MED ORDER — SODIUM CHLORIDE 0.9% FLUSH
10.0000 mL | INTRAVENOUS | Status: DC | PRN
Start: 2020-10-09 — End: 2020-10-09
  Administered 2020-10-09: 10 mL
  Filled 2020-10-09: qty 10

## 2020-10-09 NOTE — Assessment & Plan Note (Addendum)
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 Biopsy revealed IDC grade 3, ER 0%, PR 0%, Ki-67 80%, HER-2 -1+ T1c M0 stage IB  Treatment plan: 1.Bilateral mastectomies 01/09/2020: Right mastectomy:PASH Left mastectomy: Multifocal IDC grade 3 2.2 cm largest, high-grade DCIS, margins negative, 0/4 sentinel lymph nodes, ER negative, PR negative, HER-2 negative, Ki-67 80% 2.I recommended adjuvant chemotherapy with dose dense Adriamycin andCytoxanpembrolizumabfollowed by Taxol, carboplatin and pembrolizumab 3.Maintenance Pembrolizumab ----------------------------------------------------------------------------------------------------------------------------------- Current treatment:Keytruda every three weeks Labs reviewed  Chemo toxicities: 1.Peripheral neuropathy: resolved 2. Liver enzyme elevation: Monitoring labs closely, ALT is pending No immune mediated adverse effects.  I recommended she slowly increase her activity level and drink plenty of water.     Return to clinic in 3 weeks for labs, f/u with [name], and her next treatment.

## 2020-10-09 NOTE — Progress Notes (Signed)
[facility] Follow up:    [place] [address] [place] [address]   DIAGNOSIS: Cancer Staging Malignant neoplasm of upper-outer quadrant of left breast in female, estrogen receptor negative ([place]) Staging form: Breast, AJCC 8th Edition - Clinical: Stage IB (cT1c, cN0, cM0, G3, ER-, PR-, HER2-) - Signed by [name], MD on 12/04/2019 Histologic grading system: 3 grade system   SUMMARY OF ONCOLOGIC HISTORY: Oncology History  Malignant neoplasm of upper-outer quadrant of left breast in female, estrogen receptor negative ([place])  11/29/2019 Initial Diagnosis   Patient palpated an area of concern in the left breast. Diagnostic mammogram and US showed a 1.8cm mass in the upper outer right breast representing a fibroadenoma, and in the left breast, three adjacent masses at the 2-2:30 position, 1.7cm, 0.8cm, and 1.4cm, with several cysts and no axillary adenopathy. Biopsy showed a benign fibroadenoma in the right breast, and in the left breast, IDC at the 2:30 position, grade 3, HER-2 negative (1+), ER+ 0%, PR+ 0%, Ki67 80%.    12/04/2019 Cancer Staging   Staging form: Breast, AJCC 8th Edition - Clinical: Stage IB (cT1c, cN0, cM0, G3, ER-, PR-, HER2-) - Signed by [name], MD on 12/04/2019    12/21/2019 Genetic Testing   Negative genetic testing:  No pathogenic variants detected on the [facility] Multi-Cancer Panel. A variant of uncertain significance (VUS) was detected in the CDK4 gene called c.415C>T (p.Arg139*). The report date is 12/21/2019.  The Multi-Cancer Panel offered by [facility] includes sequencing and/or deletion duplication testing of the following 85 genes: AIP, ALK, APC, ATM, AXIN2,BAP1,  BARD1, BLM, BMPR1A, BRCA1, BRCA2, BRIP1, CASR, CDC73, CDH1, CDK4, CDKN1B, CDKN1C, CDKN2A (p14ARF), CDKN2A (p16INK4a), CEBPA, CHEK2, CTNNA1, DICER1, DIS3L2, EGFR (c.2369C>T, p.Thr790Met variant only), EPCAM (Deletion/duplication testing only), FH, FLCN, GATA2,  GPC3, GREM1 (Promoter region deletion/duplication testing only), HOXB13 (c.251G>A, p.Gly84Glu), HRAS, KIT, MAX, MEN1, MET, MITF (c.952G>A, p.Glu318Lys variant only), MLH1, MSH2, MSH3, MSH6, MUTYH, NBN, NF1, NF2, NTHL1, PALB2, PDGFRA, PHOX2B, PMS2, POLD1, POLE, POT1, PRKAR1A, PTCH1, PTEN, RAD50, RAD51C, RAD51D, RB1, RECQL4, RET, RNF43, RUNX1, SDHAF2, SDHA (sequence changes only), SDHB, SDHC, SDHD, SMAD4, SMARCA4, SMARCB1, SMARCE1, STK11, SUFU, TERC, TERT, TMEM127, TP53, TSC1, TSC2, VHL, WRN and WT1.    01/09/2020 Surgery   Bilateral mastectomies with reconstruction [facility]im[facility]):  Right breast: no evidence of malignancy Left breast: multifocal invasive ductal carcinoma, grade 3, largest 2.2cm, with high grade DCIS, clear margins, 4 left axillary lymph nodes negative for carcinoma.   03/18/2020 - 08/28/2020 Chemotherapy          09/17/2020 -  Chemotherapy    Patient is on Treatment Plan: BREAST PEMBROLIZUMAB Q21D         CURRENT THERAPY: Keytruda every 3 weeks.  INTERVAL HISTORY: Amanda Burns 40 y.o. female returns for follow up and evaluation of her breast cancer.  She continues on Keytruda every three weeks with good tolerance.  She has some mild fatigue, but is otherwise feeling well.  She did have an episode in the heat over the weekend where she felt dizzy, but once she put some ice on herself and cooled down in the pool it improved.     Patient Active Problem List   Diagnosis Date Noted   Port-A-Cath in place 06/25/2020   Breast cancer, left breast ([place]) 01/09/2020   Genetic testing 12/23/2019   Family history of breast cancer    Family history of colon cancer    Family history of adrenal cancer  Family history of throat cancer    Malignant neoplasm of upper-outer quadrant of left breast in female, estrogen receptor negative ([place]) 11/29/2019   Normal labor 10/01/2014   Postpartum care following vaginal delivery (3/28) 06/08/2013   Pregnancy 06/07/2013    is allergic  to benzoyl peroxide and hydrocodone.  MEDICAL HISTORY: Past Medical History:  Diagnosis Date   Anxiety    Breast cancer ([place]) 2021   Depression    Family history of adrenal cancer    Family history of breast cancer    Family history of colon cancer    Family history of throat cancer    GERD (gastroesophageal reflux disease) 2015   History of kidney stones 2006   Postpartum care following vaginal delivery (3/28) 06/08/2013    SURGICAL HISTORY: Past Surgical History:  Procedure Laterality Date   BREAST RECONSTRUCTION WITH PLACEMENT OF TISSUE EXPANDER AND FLEX HD (ACELLULAR HYDRATED DERMIS) Bilateral 01/09/2020   Procedure: BREAST RECONSTRUCTION WITH PLACEMENT OF TISSUE EXPANDER AND FLEX HD (ACELLULAR HYDRATED DERMIS);  Surgeon: Irene Limbo, MD;  Location: [facility];  Service: Plastics;  Laterality: Bilateral;   FOOT SURGERY     MASTECTOMY W/ SENTINEL NODE BIOPSY Bilateral 01/09/2020   Procedure: BILATERAL MASTECTOMY WITH LEFT SENTINEL LYMPH NODE BIOPSY;  Surgeon: [name], MD;  Location: [facility];  Service: General;  Laterality: Bilateral;  PECTORAL BLOCK, RNFA   PORTACATH PLACEMENT Right 01/09/2020   Procedure: INSERTION PORT-A-CATH;  Surgeon: [name], MD;  Location: [facility];  Service: General;  Laterality: Right;    SOCIAL HISTORY: Social History   Socioeconomic History   Marital status: Single    Spouse name: Not on file   Number of children: Not on file   Years of education: Not on file   Highest education level: Not on file  Occupational History   Not on file  Tobacco Use   Smoking status: Never   Smokeless tobacco: Never  Vaping Use   Vaping Use: Never used  Substance and Sexual Activity   Alcohol use: No   Drug use: No   Sexual activity: Yes    Birth control/protection: None  Other Topics Concern   Not on file  Social History Narrative   Not on file   Social Determinants of Health   Financial Resource Strain: Not on file  Food Insecurity: Not on  file  Transportation Needs: Not on file  Physical Activity: Not on file  Stress: Not on file  Social Connections: Not on file  Intimate Partner Violence: Not on file    FAMILY HISTORY: Family History  Problem Relation Age of Onset   Breast cancer Other        maternal great-aunt (MGF's sister), dx in her 20s   Breast cancer Maternal Grandmother 71   Colon cancer Maternal Grandmother 63   Cancer Maternal Grandmother 62       Adrenal cancer   Breast cancer Paternal Aunt        dx. in her 49s   Throat cancer Paternal Aunt        dx. in her 23s, non-smoker    Review of Systems  Constitutional:  Positive for fatigue. Negative for appetite change, chills, fever and unexpected weight change.  HENT:   Negative for hearing loss, lump/mass and trouble swallowing.   Eyes:  Negative for eye problems and icterus.  Respiratory:  Negative for chest tightness, cough and shortness of breath.   Cardiovascular:  Negative for chest pain, leg swelling and palpitations.  Gastrointestinal:  Negative for abdominal distention, abdominal pain, constipation, diarrhea, nausea and vomiting.  Endocrine: Negative for hot flashes.  Genitourinary:  Negative for difficulty urinating.   Musculoskeletal:  Negative for arthralgias.  Skin:  Negative for itching and rash.  Neurological:  Negative for dizziness, extremity weakness, headaches and numbness.  Hematological:  Negative for adenopathy. Does not bruise/bleed easily.  Psychiatric/Behavioral:  Negative for depression. The patient is not nervous/anxious.      PHYSICAL EXAMINATION  ECOG PERFORMANCE STATUS: 1 - Symptomatic but completely ambulatory  Vitals:   10/09/20 1036  BP: 126/75  Pulse: (!) 105  Resp: 18  Temp: (!) 97.1 F (36.2 C)  SpO2: 100%    Physical Exam Constitutional:      General: She is not in acute distress.    Appearance: Normal appearance. She is not toxic-appearing.  HENT:     Head: Normocephalic and atraumatic.  Eyes:      General: No scleral icterus. Cardiovascular:     Rate and Rhythm: Normal rate and regular rhythm.     Pulses: Normal pulses.     Heart sounds: Normal heart sounds.  Pulmonary:     Effort: Pulmonary effort is normal.     Breath sounds: Normal breath sounds.  Abdominal:     General: Abdomen is flat. Bowel sounds are normal. There is no distension.     Palpations: Abdomen is soft.     Tenderness: There is no abdominal tenderness.  Musculoskeletal:        General: No swelling.     Cervical back: Neck supple.  Lymphadenopathy:     Cervical: No cervical adenopathy.  Skin:    General: Skin is warm and dry.     Findings: No rash.  Neurological:     General: No focal deficit present.     Mental Status: She is alert.  Psychiatric:        Mood and Affect: Mood normal.        Behavior: Behavior normal.    LABORATORY DATA:  CBC    Component Value Date/Time   WBC 4.6 10/09/2020 1016   WBC 3.5 (L) 09/17/2020 1101   RBC 3.45 (L) 10/09/2020 1016   HGB 9.8 (L) 10/09/2020 1016   HCT 29.7 (L) 10/09/2020 1016   PLT 354 10/09/2020 1016   MCV 86.1 10/09/2020 1016   MCH 28.4 10/09/2020 1016   MCHC 33.0 10/09/2020 1016   RDW 16.1 (H) 10/09/2020 1016   LYMPHSABS 1.0 09/17/2020 1101   MONOABS 0.5 09/17/2020 1101   EOSABS 0.0 09/17/2020 1101   BASOSABS 0.0 09/17/2020 1101    CMP     Component Value Date/Time   NA 141 10/09/2020 1016   K 3.9 10/09/2020 1016   CL 106 10/09/2020 1016   CO2 26 10/09/2020 1016   GLUCOSE 114 (H) 10/09/2020 1016   BUN 11 10/09/2020 1016   CREATININE 0.84 10/09/2020 1016   CALCIUM 9.6 10/09/2020 1016   PROT 7.5 10/09/2020 1016   ALBUMIN 4.0 10/09/2020 1016   AST 27 10/09/2020 1016   ALT 39 10/09/2020 1016   ALKPHOS 121 10/09/2020 1016   BILITOT 0.3 10/09/2020 1016   GFRNONAA >60 10/09/2020 1016   GFRAA >60 12/04/2019 0835    ASSESSMENT and PLAN:   Malignant neoplasm of upper-outer quadrant of left breast in female, estrogen receptor negative  ([place]) Palpable left breast mass: 1.8 cm UOQ fibroadenoma in the right breast, left breast 3 masses 2 to 2:30 position: 1.7, 1.8, 1.4 cm.  Biopsy revealed  IDC grade 3, ER 0%, PR 0%, Ki-67 80%, HER-2 -1+ T1c M0 stage IB   Treatment plan: 1.    Bilateral mastectomies 01/09/2020: Right mastectomy:PASH Left mastectomy: Multifocal IDC grade 3 2.2 cm largest, high-grade DCIS, margins negative, 0/4 sentinel lymph nodes, ER negative, PR negative, HER-2 negative, Ki-67 80% 2.  I recommended adjuvant chemotherapy with dose dense Adriamycin and Cytoxan pembrolizumab followed by Taxol, carboplatin and pembrolizumab 3.  Maintenance Pembrolizumab ----------------------------------------------------------------------------------------------------------------------------------- Current treatment: Keytruda every three weeks Labs reviewed   Chemo toxicities: 1.  Peripheral neuropathy: resolved 2. Liver enzyme elevation: Monitoring labs closely, ALT is pending No immune mediated adverse effects.  I recommended she slowly increase her activity level and drink plenty of water.     Return to clinic in 3 weeks for labs, f/u with [name], and her next treatment.      Orders Placed This Encounter  Procedures   CBC with Differential ([facility] Only)    Standing Status:   Standing    Number of Occurrences:   52    Standing Expiration Date:   10/09/2021   CMP ([facility] only)    Standing Status:   Standing    Number of Occurrences:   52    Standing Expiration Date:   10/09/2021   TSH    Standing Status:   Standing    Number of Occurrences:   52    Standing Expiration Date:   10/09/2021   T4, free    Standing Status:   Standing    Number of Occurrences:   52    Standing Expiration Date:   10/09/2021    All questions were answered. The patient knows to call the clinic with any problems, questions or concerns. We can certainly see the patient much sooner if necessary.  Total encounter time: 35  minutes in face to face visit time, chart review, lab review, care coordination and documentation of the encounter.   Wilber Bihari, NP 10/09/20 11:12 AM Medical Oncology and Hematology [facility] [place], [place] [address] Tel. [phone]    Fax. [phone]  *Total Encounter Time as defined by the Centers for Medicare and Medicaid Services includes, in addition to the face-to-face time of a patient visit (documented in the note above) non-face-to-face time: obtaining and reviewing outside history, ordering and reviewing medications, tests or procedures, care coordination (communications with other health care professionals or caregivers) and documentation in the medical record.

## 2020-10-28 NOTE — Progress Notes (Signed)
Patient Care Team: Gara Kroner, DO as PCP - General (Family Medicine) [name], RN as Oncology Nurse Navigator [name], RN as Oncology Nurse Navigator [name], MD as Consulting Physician (General Surgery) [name], MD as Consulting Physician (Hematology and Oncology) [name], MD as Consulting Physician (Radiation Oncology)  DIAGNOSIS:    ICD-10-CM   1. Malignant neoplasm of upper-outer quadrant of left breast in female, estrogen receptor negative ([place])  C50.412    Z17.1       SUMMARY OF ONCOLOGIC HISTORY: Oncology History  Malignant neoplasm of upper-outer quadrant of left breast in female, estrogen receptor negative ([place])  11/29/2019 Initial Diagnosis   Patient palpated an area of concern in the left breast. Diagnostic mammogram and US showed a 1.8cm mass in the upper outer right breast representing a fibroadenoma, and in the left breast, three adjacent masses at the 2-2:30 position, 1.7cm, 0.8cm, and 1.4cm, with several cysts and no axillary adenopathy. Biopsy showed a benign fibroadenoma in the right breast, and in the left breast, IDC at the 2:30 position, grade 3, HER-2 negative (1+), ER+ 0%, PR+ 0%, Ki67 80%.    12/04/2019 Cancer Staging   Staging form: Breast, AJCC 8th Edition - Clinical: Stage IB (cT1c, cN0, cM0, G3, ER-, PR-, HER2-) - Signed by [name], MD on 12/04/2019   12/21/2019 Genetic Testing   Negative genetic testing:  No pathogenic variants detected on the [facility] Multi-Cancer Panel. A variant of uncertain significance (VUS) was detected in the CDK4 gene called c.415C>T (p.Arg139*). The report date is 12/21/2019.  The Multi-Cancer Panel offered by [facility] includes sequencing and/or deletion duplication testing of the following 85 genes: AIP, ALK, APC, ATM, AXIN2,BAP1,  BARD1, BLM, BMPR1A, BRCA1, BRCA2, BRIP1, CASR, CDC73, CDH1, CDK4, CDKN1B, CDKN1C, CDKN2A (p14ARF), CDKN2A (p16INK4a), CEBPA, CHEK2, CTNNA1, DICER1, DIS3L2, EGFR  (c.2369C>T, p.Thr790Met variant only), EPCAM (Deletion/duplication testing only), FH, FLCN, GATA2, GPC3, GREM1 (Promoter region deletion/duplication testing only), HOXB13 (c.251G>A, p.Gly84Glu), HRAS, KIT, MAX, MEN1, MET, MITF (c.952G>A, p.Glu318Lys variant only), MLH1, MSH2, MSH3, MSH6, MUTYH, NBN, NF1, NF2, NTHL1, PALB2, PDGFRA, PHOX2B, PMS2, POLD1, POLE, POT1, PRKAR1A, PTCH1, PTEN, RAD50, RAD51C, RAD51D, RB1, RECQL4, RET, RNF43, RUNX1, SDHAF2, SDHA (sequence changes only), SDHB, SDHC, SDHD, SMAD4, SMARCA4, SMARCB1, SMARCE1, STK11, SUFU, TERC, TERT, TMEM127, TP53, TSC1, TSC2, VHL, WRN and WT1.    01/09/2020 Surgery   Bilateral mastectomies with reconstruction [facility]):  Right breast: no evidence of malignancy Left breast: multifocal invasive ductal carcinoma, grade 3, largest 2.2cm, with high grade DCIS, clear margins, 4 left axillary lymph nodes negative for carcinoma.   03/18/2020 - 08/28/2020 Chemotherapy          09/17/2020 -  Chemotherapy    Patient is on Treatment Plan: BREAST PEMBROLIZUMAB Q21D         CHIEF COMPLIANT: Cycle 12 Taxol, carbo and pembrolizumab  INTERVAL HISTORY: Amanda Burns is a 40 y.o. with above-mentioned history of left breast cancer who underwent bilateral mastectomies with reconstruction and is currently on adjuvant chemotherapy with Taxol, carbo and pembrolizumab after completing 4 cycles of Adriamycin and Cytoxan. She presents to the clinic today for a toxicity check and treatment.  Her major complaints today are related to diffuse but body muscle aches and pains.  Also profound fatigue.  She had 1 episode of feeling dizzy and lightheaded after spending some time in the hot sun.  ALLERGIES:  is allergic to benzoyl peroxide and hydrocodone.  MEDICATIONS:  Current Outpatient Medications  Medication Sig  Dispense Refill   acetaminophen (TYLENOL) 500 MG tablet Take 1,000 mg by mouth every 6 (six) hours as needed for moderate pain or headache.      amphetamine-dextroamphetamine (ADDERALL XR) 20 MG 24 hr capsule Take 20 mg by mouth daily.      BIOTIN PO Take by mouth.     bismuth subsalicylate (PEPTO BISMOL) 262 MG/15ML suspension Take 30 mLs by mouth every 6 (six) hours as needed for indigestion or diarrhea or loose stools.     buPROPion (WELLBUTRIN XL) 150 MG 24 hr tablet Take 150 mg by mouth daily.     escitalopram (LEXAPRO) 20 MG tablet Take 20 mg by mouth daily.     fluticasone (FLONASE) 50 MCG/ACT nasal spray Place 1 spray into both nostrils daily as needed for allergies or rhinitis.     ibuprofen (ADVIL) 200 MG tablet Take 400 mg by mouth every 6 (six) hours as needed for headache or moderate pain.     omeprazole (PRILOSEC) 20 MG capsule Take 40 mg by mouth daily.     No current facility-administered medications for this visit.   Facility-Administered Medications Ordered in Other Visits  Medication Dose Route Frequency Provider Last Rate Last Admin   sodium chloride flush (NS) 0.9 % injection 10 mL  10 mL Intravenous PRN [name], MD   10 mL at 03/18/20 1033    PHYSICAL EXAMINATION: ECOG PERFORMANCE STATUS: 1 - Symptomatic but completely ambulatory  Vitals:   10/29/20 0950  BP: 125/83  Pulse: 94  Resp: 18  Temp: (!) 97.2 F (36.2 C)  SpO2: 100%   Filed Weights   10/29/20 0950  Weight: 167 lb 3.2 oz (75.8 kg)    LABORATORY DATA:  I have reviewed the data as listed CMP Latest Ref Rng & Units 10/09/2020 09/17/2020 08/28/2020  Glucose 70 - 99 mg/dL 114(H) 103(H) 143(H)  BUN 6 - 20 mg/dL $Remove'11 9 11  'IvkDQzl$ Creatinine 0.44 - 1.00 mg/dL 0.84 0.72 0.85  Sodium 135 - 145 mmol/L 141 142 137  Potassium 3.5 - 5.1 mmol/L 3.9 3.8 3.5  Chloride 98 - 111 mmol/L 106 103 102  CO2 22 - 32 mmol/L $RemoveB'26 30 27  'qvhEefvT$ Calcium 8.9 - 10.3 mg/dL 9.6 10.0 9.4  Total Protein 6.5 - 8.1 g/dL 7.5 7.9 7.5  Total Bilirubin 0.3 - 1.2 mg/dL 0.3 0.2(L) 0.3  Alkaline Phos 38 - 126 U/L 121 156(H) 127(H)  AST 15 - 41 U/L 27 28 59(H)  ALT 0 - 44 U/L 39 47(H) 88(H)     Lab Results  Component Value Date   WBC 4.6 10/09/2020   HGB 9.8 (L) 10/09/2020   HCT 29.7 (L) 10/09/2020   MCV 86.1 10/09/2020   PLT 354 10/09/2020   NEUTROABS 2.0 09/17/2020    ASSESSMENT & PLAN:  Malignant neoplasm of upper-outer quadrant of left breast in female, estrogen receptor negative ([facility]) Palpable left breast mass: 1.8 cm UOQ fibroadenoma in the right breast, left breast 3 masses 2 to 2:30 position: 1.7, 1.8, 1.4 cm.  Biopsy revealed IDC grade 3, ER 0%, PR 0%, Ki-67 80%, HER-2 -1+ T1c M0 stage IB   Treatment plan: 1.    Bilateral mastectomies 01/09/2020: Right mastectomy:PASH Left mastectomy: Multifocal IDC grade 3 2.2 cm largest, high-grade DCIS, margins negative, 0/4 sentinel lymph nodes, ER negative, PR negative, HER-2 negative, Ki-67 80% 2.  I recommended adjuvant chemotherapy with dose dense Adriamycin and Cytoxan pembrolizumab followed by Taxol, carboplatin and pembrolizumab completed 08/28/2020 3.  Maintenance Pembrolizumab -----------------------------------------------------------------------------------------------------------------------------------  Current treatment: Keytruda every three weeks (she will finish by end of December 2022) Labs reviewed   Chemo toxicities: 1.  Peripheral neuropathy: resolved 2. Liver enzyme elevation: Monitoring labs closely  3.  Diffuse body aches and pains: Probably from [place]. 4.  Profound fatigue: Thyroid function has been ordered today.  We will await the results. No immune mediated adverse effects.   Return to clinic every 3 weeks for Keytruda and labs and I will see her back in 6 weeks.      No orders of the defined types were placed in this encounter.  The patient has a good understanding of the overall plan. she agrees with it. she will call with any problems that may develop before the next visit here.  Total time spent: 30 mins including face to face time and time spent for planning, charting and  coordination of care  Rulon Eisenmenger, MD, MPH 10/29/2020  I, Thana Ates, am acting as scribe for Dr. [name].  I have reviewed the above documentation for accuracy and completeness, and I agree with the above.

## 2020-10-28 NOTE — Assessment & Plan Note (Signed)
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 Biopsy revealed IDC grade 3, ER 0%, PR 0%, Ki-67 80%, HER-2 -1+ T1c M0 stage IB  Treatment plan: 1.Bilateral mastectomies 01/09/2020: Right mastectomy:PASH Left mastectomy: Multifocal IDC grade 3 2.2 cm largest, high-grade DCIS, margins negative, 0/4 sentinel lymph nodes, ER negative, PR negative, HER-2 negative, Ki-67 80% 2.I recommended adjuvant chemotherapy with dose dense Adriamycin andCytoxanpembrolizumabfollowed by Taxol, carboplatin and pembrolizumab 3.Maintenance Pembrolizumab ----------------------------------------------------------------------------------------------------------------------------------- Current treatment:Keytruda every three weeks Labs reviewed  Chemo toxicities: 1.Peripheral neuropathy: resolved 2. Liver enzyme elevation:Monitoring labs closely, ALT is pending No immune mediated adverse effects.  I recommended she slowly increase her activity level and drink plenty of water.

## 2020-10-29 ENCOUNTER — Inpatient Hospital Stay (HOSPITAL_BASED_OUTPATIENT_CLINIC_OR_DEPARTMENT_OTHER): Payer: BC Managed Care – PPO | Admitting: Hematology and Oncology

## 2020-10-29 ENCOUNTER — Inpatient Hospital Stay: Payer: BC Managed Care – PPO | Attending: Hematology and Oncology

## 2020-10-29 ENCOUNTER — Other Ambulatory Visit: Payer: Self-pay

## 2020-10-29 ENCOUNTER — Inpatient Hospital Stay: Payer: BC Managed Care – PPO

## 2020-10-29 DIAGNOSIS — Z79899 Other long term (current) drug therapy: Secondary | ICD-10-CM | POA: Insufficient documentation

## 2020-10-29 DIAGNOSIS — Z171 Estrogen receptor negative status [ER-]: Secondary | ICD-10-CM | POA: Diagnosis not present

## 2020-10-29 DIAGNOSIS — Z9013 Acquired absence of bilateral breasts and nipples: Secondary | ICD-10-CM | POA: Diagnosis not present

## 2020-10-29 DIAGNOSIS — C50412 Malignant neoplasm of upper-outer quadrant of left female breast: Secondary | ICD-10-CM | POA: Diagnosis not present

## 2020-10-29 DIAGNOSIS — G620 Drug-induced polyneuropathy: Secondary | ICD-10-CM | POA: Diagnosis not present

## 2020-10-29 DIAGNOSIS — Z803 Family history of malignant neoplasm of breast: Secondary | ICD-10-CM | POA: Diagnosis not present

## 2020-10-29 DIAGNOSIS — D241 Benign neoplasm of right breast: Secondary | ICD-10-CM | POA: Diagnosis not present

## 2020-10-29 DIAGNOSIS — Z5112 Encounter for antineoplastic immunotherapy: Secondary | ICD-10-CM | POA: Diagnosis present

## 2020-10-29 DIAGNOSIS — E039 Hypothyroidism, unspecified: Secondary | ICD-10-CM | POA: Diagnosis not present

## 2020-10-29 DIAGNOSIS — D649 Anemia, unspecified: Secondary | ICD-10-CM | POA: Insufficient documentation

## 2020-10-29 DIAGNOSIS — T451X5D Adverse effect of antineoplastic and immunosuppressive drugs, subsequent encounter: Secondary | ICD-10-CM | POA: Insufficient documentation

## 2020-10-29 DIAGNOSIS — R7401 Elevation of levels of liver transaminase levels: Secondary | ICD-10-CM | POA: Insufficient documentation

## 2020-10-29 DIAGNOSIS — Z95828 Presence of other vascular implants and grafts: Secondary | ICD-10-CM

## 2020-10-29 LAB — CBC WITH DIFFERENTIAL (CANCER CENTER ONLY)
Abs Immature Granulocytes: 0.01 10*3/uL (ref 0.00–0.07)
Basophils Absolute: 0 10*3/uL (ref 0.0–0.1)
Basophils Relative: 1 %
Eosinophils Absolute: 0.1 10*3/uL (ref 0.0–0.5)
Eosinophils Relative: 2 %
HCT: 30.8 % — ABNORMAL LOW (ref 36.0–46.0)
Hemoglobin: 10 g/dL — ABNORMAL LOW (ref 12.0–15.0)
Immature Granulocytes: 0 %
Lymphocytes Relative: 27 %
Lymphs Abs: 0.9 10*3/uL (ref 0.7–4.0)
MCH: 27.1 pg (ref 26.0–34.0)
MCHC: 32.5 g/dL (ref 30.0–36.0)
MCV: 83.5 fL (ref 80.0–100.0)
Monocytes Absolute: 0.4 10*3/uL (ref 0.1–1.0)
Monocytes Relative: 11 %
Neutro Abs: 1.9 10*3/uL (ref 1.7–7.7)
Neutrophils Relative %: 59 %
Platelet Count: 226 10*3/uL (ref 150–400)
RBC: 3.69 MIL/uL — ABNORMAL LOW (ref 3.87–5.11)
RDW: 14.4 % (ref 11.5–15.5)
WBC Count: 3.3 10*3/uL — ABNORMAL LOW (ref 4.0–10.5)
nRBC: 0 % (ref 0.0–0.2)

## 2020-10-29 LAB — CMP (CANCER CENTER ONLY)
ALT: 59 U/L — ABNORMAL HIGH (ref 0–44)
AST: 33 U/L (ref 15–41)
Albumin: 4 g/dL (ref 3.5–5.0)
Alkaline Phosphatase: 127 U/L — ABNORMAL HIGH (ref 38–126)
Anion gap: 10 (ref 5–15)
BUN: 9 mg/dL (ref 6–20)
CO2: 26 mmol/L (ref 22–32)
Calcium: 9.5 mg/dL (ref 8.9–10.3)
Chloride: 103 mmol/L (ref 98–111)
Creatinine: 0.79 mg/dL (ref 0.44–1.00)
GFR, Estimated: >60 mL/min (ref >60)
Glucose, Bld: 101 mg/dL — ABNORMAL HIGH (ref 70–99)
Potassium: 4 mmol/L (ref 3.5–5.1)
Sodium: 139 mmol/L (ref 135–145)
Total Bilirubin: 0.2 mg/dL — ABNORMAL LOW (ref 0.3–1.2)
Total Protein: 7.4 g/dL (ref 6.5–8.1)

## 2020-10-29 LAB — TSH: TSH: 0.91 u[IU]/mL (ref 0.308–3.960)

## 2020-10-29 LAB — T4, FREE: Free T4: 0.82 ng/dL (ref 0.61–1.12)

## 2020-10-29 MED ORDER — SODIUM CHLORIDE 0.9% FLUSH
10.0000 mL | INTRAVENOUS | Status: DC | PRN
  Administered 2020-10-29: 10 mL

## 2020-10-29 MED ORDER — SODIUM CHLORIDE 0.9% FLUSH
10.0000 mL | Freq: Once | INTRAVENOUS | Status: AC
  Administered 2020-10-29: 10 mL

## 2020-10-29 MED ORDER — HEPARIN SOD (PORK) LOCK FLUSH 100 UNIT/ML IV SOLN
500.0000 [IU] | Freq: Once | INTRAVENOUS | Status: AC | PRN
  Administered 2020-10-29: 500 [IU]

## 2020-10-29 MED ORDER — SODIUM CHLORIDE 0.9 % IV SOLN
200.0000 mg | Freq: Once | INTRAVENOUS | Status: AC
  Administered 2020-10-29: 200 mg via INTRAVENOUS
  Filled 2020-10-29: qty 8

## 2020-10-29 MED ORDER — SODIUM CHLORIDE 0.9 % IV SOLN: Freq: Once | INTRAVENOUS | Status: AC

## 2020-10-29 NOTE — Patient Instructions (Signed)
[facility]  Discharge Instructions: Thank you for choosing [facility] to provide your oncology and hematology care.   If you have a lab appointment with the Cancer Center, please go directly to the Cancer Center and check in at the registration area.   Wear comfortable clothing and clothing appropriate for easy access to any Portacath or PICC line.   We strive to give you quality time with your provider. You may need to reschedule your appointment if you arrive late (15 or more minutes).  Arriving late affects you and other patients whose appointments are after yours.  Also, if you miss three or more appointments without notifying the office, you may be dismissed from the clinic at the provider's discretion.      For prescription refill requests, have your pharmacy contact our office and allow 72 hours for refills to be completed.    Today you received the following chemotherapy and/or immunotherapy agents Keytruda      To help prevent nausea and vomiting after your treatment, we encourage you to take your nausea medication as directed.  BELOW ARE SYMPTOMS THAT SHOULD BE REPORTED IMMEDIATELY: *FEVER GREATER THAN 100.4 F (38 C) OR HIGHER *CHILLS OR SWEATING *NAUSEA AND VOMITING THAT IS NOT CONTROLLED WITH YOUR NAUSEA MEDICATION *UNUSUAL SHORTNESS OF BREATH *UNUSUAL BRUISING OR BLEEDING *URINARY PROBLEMS (pain or burning when urinating, or frequent urination) *BOWEL PROBLEMS (unusual diarrhea, constipation, pain near the anus) TENDERNESS IN MOUTH AND THROAT WITH OR WITHOUT PRESENCE OF ULCERS (sore throat, sores in mouth, or a toothache) UNUSUAL RASH, SWELLING OR PAIN  UNUSUAL VAGINAL DISCHARGE OR ITCHING   Items with * indicate a potential emergency and should be followed up as soon as possible or go to the Emergency Department if any problems should occur.  Please show the CHEMOTHERAPY ALERT CARD or IMMUNOTHERAPY ALERT CARD at check-in to  the Emergency Department and triage nurse.  Should you have questions after your visit or need to cancel or reschedule your appointment, please contact [facility]  Dept: [phone]  and follow the prompts.  Office hours are 8:00 a.m. to 4:30 p.m. Monday - Friday. Please note that voicemails left after 4:00 p.m. may not be returned until the following business day.  We are closed weekends and major holidays. You have access to a nurse at all times for urgent questions. Please call the main number to the clinic Dept: [phone] and follow the prompts.   For any non-urgent questions, you may also contact your provider using MyChart. We now offer e-Visits for anyone 18 and older to request care online for non-urgent symptoms. For details visit [email or web address].   Also download the MyChart app! Go to the app store, search "MyChart", open the app, select [facility], and log in with your MyChart username and password.  Due to Covid, a mask is required upon entering the hospital/clinic. If you do not have a mask, one will be given to you upon arrival. For doctor visits, patients may have 1 support person aged 18 or older with them. For treatment visits, patients cannot have anyone with them due to current Covid guidelines and our immunocompromised population.   Pembrolizumab injection What is this medication? PEMBROLIZUMAB (pem broe liz ue mab) is a monoclonal antibody. It is used totreat certain types of cancer. This medicine may be used for other purposes; ask your health care provider orpharmacist if you have questions. COMMON BRAND NAME(S): Keytruda What should I   tell my care team before I take this medication? They need to know if you have any of these conditions: autoimmune diseases like Crohn's disease, ulcerative colitis, or lupus have had or planning to have an allogeneic stem cell transplant (uses someone else's stem cells) history of organ  transplant history of chest radiation nervous system problems like myasthenia gravis or Guillain-Barre syndrome an unusual or allergic reaction to pembrolizumab, other medicines, foods, dyes, or preservatives pregnant or trying to get pregnant breast-feeding How should I use this medication? This medicine is for infusion into a vein. It is given by a health careprofessional in a hospital or clinic setting. A special MedGuide will be given to you before each treatment. Be sure to readthis information carefully each time. Talk to your pediatrician regarding the use of this medicine in children. While this drug may be prescribed for children as young as 6 months for selectedconditions, precautions do apply. Overdosage: If you think you have taken too much of this medicine contact apoison control center or emergency room at once. NOTE: This medicine is only for you. Do not share this medicine with others. What if I miss a dose? It is important not to miss your dose. Call your doctor or health careprofessional if you are unable to keep an appointment. What may interact with this medication? Interactions have not been studied. This list may not describe all possible interactions. Give your health care provider a list of all the medicines, herbs, non-prescription drugs, or dietary supplements you use. Also tell them if you smoke, drink alcohol, or use illegaldrugs. Some items may interact with your medicine. What should I watch for while using this medication? Your condition will be monitored carefully while you are receiving thismedicine. You may need blood work done while you are taking this medicine. Do not become pregnant while taking this medicine or for 4 months after stopping it. Women should inform their doctor if they wish to become pregnant or think they might be pregnant. There is a potential for serious side effects to an unborn child. Talk to your health care professional or pharmacist for  more information. Do not breast-feed an infant while taking this medicine orfor 4 months after the last dose. What side effects may I notice from receiving this medication? Side effects that you should report to your doctor or health care professionalas soon as possible: allergic reactions like skin rash, itching or hives, swelling of the face, lips, or tongue bloody or black, tarry breathing problems changes in vision chest pain chills confusion constipation cough diarrhea dizziness or feeling faint or lightheaded fast or irregular heartbeat fever flushing joint pain low blood counts - this medicine may decrease the number of white blood cells, red blood cells and platelets. You may be at increased risk for infections and bleeding. muscle pain muscle weakness pain, tingling, numbness in the hands or feet persistent headache redness, blistering, peeling or loosening of the skin, including inside the mouth signs and symptoms of high blood sugar such as dizziness; dry mouth; dry skin; fruity breath; nausea; stomach pain; increased hunger or thirst; increased urination signs and symptoms of kidney injury like trouble passing urine or change in the amount of urine signs and symptoms of liver injury like dark urine, light-colored stools, loss of appetite, nausea, right upper belly pain, yellowing of the eyes or skin sweating swollen lymph nodes weight loss Side effects that usually do not require medical attention (report to yourdoctor or health care professional if they continue   or are bothersome): decreased appetite hair loss tiredness This list may not describe all possible side effects. Call your doctor for medical advice about side effects. You may report side effects to FDA at1-800-FDA-1088. Where should I keep my medication? This drug is given in a hospital or clinic and will not be stored at home. NOTE: This sheet is a summary. It may not cover all possible information. If you  have questions about this medicine, talk to your doctor, pharmacist, orhealth care provider.  2022 Elsevier/Gold Standard (2019-01-30 21:44:53)   

## 2020-11-18 NOTE — Progress Notes (Signed)
Patient Care Team: Gara Kroner, DO as PCP - General (Family Medicine) [name], RN as Oncology Nurse Navigator [name], RN as Oncology Nurse Navigator [name], MD as Consulting Physician (General Surgery) [name], MD as Consulting Physician (Hematology and Oncology) [name], MD as Consulting Physician (Radiation Oncology)  DIAGNOSIS:    ICD-10-CM   1. Malignant neoplasm of upper-outer quadrant of left breast in female, estrogen receptor negative ([place])  C50.412    Z17.1       SUMMARY OF ONCOLOGIC HISTORY: Oncology History  Malignant neoplasm of upper-outer quadrant of left breast in female, estrogen receptor negative ([place])  11/29/2019 Initial Diagnosis   Patient palpated an area of concern in the left breast. Diagnostic mammogram and US showed a 1.8cm mass in the upper outer right breast representing a fibroadenoma, and in the left breast, three adjacent masses at the 2-2:30 position, 1.7cm, 0.8cm, and 1.4cm, with several cysts and no axillary adenopathy. Biopsy showed a benign fibroadenoma in the right breast, and in the left breast, IDC at the 2:30 position, grade 3, HER-2 negative (1+), ER+ 0%, PR+ 0%, Ki67 80%.    12/04/2019 Cancer Staging   Staging form: Breast, AJCC 8th Edition - Clinical: Stage IB (cT1c, cN0, cM0, G3, ER-, PR-, HER2-) - Signed by [name], MD on 12/04/2019   12/21/2019 Genetic Testing   Negative genetic testing:  No pathogenic variants detected on the [facility] Multi-Cancer Panel. A variant of uncertain significance (VUS) was detected in the CDK4 gene called c.415C>T (p.Arg139*). The report date is 12/21/2019.  The Multi-Cancer Panel offered by [facility] includes sequencing and/or deletion duplication testing of the following 85 genes: AIP, ALK, APC, ATM, AXIN2,BAP1,  BARD1, BLM, BMPR1A, BRCA1, BRCA2, BRIP1, CASR, CDC73, CDH1, CDK4, CDKN1B, CDKN1C, CDKN2A (p14ARF), CDKN2A (p16INK4a), CEBPA, CHEK2, CTNNA1, DICER1, DIS3L2, EGFR  (c.2369C>T, p.Thr790Met variant only), EPCAM (Deletion/duplication testing only), FH, FLCN, GATA2, GPC3, GREM1 (Promoter region deletion/duplication testing only), HOXB13 (c.251G>A, p.Gly84Glu), HRAS, KIT, MAX, MEN1, MET, MITF (c.952G>A, p.Glu318Lys variant only), MLH1, MSH2, MSH3, MSH6, MUTYH, NBN, NF1, NF2, NTHL1, PALB2, PDGFRA, PHOX2B, PMS2, POLD1, POLE, POT1, PRKAR1A, PTCH1, PTEN, RAD50, RAD51C, RAD51D, RB1, RECQL4, RET, RNF43, RUNX1, SDHAF2, SDHA (sequence changes only), SDHB, SDHC, SDHD, SMAD4, SMARCA4, SMARCB1, SMARCE1, STK11, SUFU, TERC, TERT, TMEM127, TP53, TSC1, TSC2, VHL, WRN and WT1.    01/09/2020 Surgery   Bilateral mastectomies with reconstruction [facility]):  Right breast: no evidence of malignancy Left breast: multifocal invasive ductal carcinoma, grade 3, largest 2.2cm, with high grade DCIS, clear margins, 4 left axillary lymph nodes negative for carcinoma.   03/18/2020 - 08/28/2020 Chemotherapy   Adriamycin, Cytoxan Keytruda followed by [facility]   09/17/2020 -  Chemotherapy    Patient is on Treatment Plan: BREAST PEMBROLIZUMAB Q21D         CHIEF COMPLIANT: Cycle 4 Keytruda  INTERVAL HISTORY: Amanda Burns is a 40 y.o. with above-mentioned history of breast cancer who underwent bilateral mastectomies with reconstruction. She presents to the clinic for cycle 4 of Keytruda.  Her fatigue has improved to a certain degree..  Muscle aches and pains and joint stiffness are bothering her.  Other than that she is tolerating Keytruda fairly well.  ALLERGIES:  is allergic to benzoyl peroxide and hydrocodone.  MEDICATIONS:  Current Outpatient Medications  Medication Sig Dispense Refill   acetaminophen (TYLENOL) 500 MG tablet Take 1,000 mg by mouth every 6 (six) hours as needed for moderate pain or headache.  amphetamine-dextroamphetamine (ADDERALL XR) 20 MG 24 hr capsule Take 20 mg by mouth daily.      BIOTIN PO Take by mouth.     bismuth subsalicylate (PEPTO  BISMOL) 262 MG/15ML suspension Take 30 mLs by mouth every 6 (six) hours as needed for indigestion or diarrhea or loose stools.     buPROPion (WELLBUTRIN XL) 150 MG 24 hr tablet Take 150 mg by mouth daily.     escitalopram (LEXAPRO) 20 MG tablet Take 20 mg by mouth daily.     fluticasone (FLONASE) 50 MCG/ACT nasal spray Place 1 spray into both nostrils daily as needed for allergies or rhinitis.     ibuprofen (ADVIL) 200 MG tablet Take 400 mg by mouth every 6 (six) hours as needed for headache or moderate pain.     omeprazole (PRILOSEC) 20 MG capsule Take 40 mg by mouth daily.     No current facility-administered medications for this visit.   Facility-Administered Medications Ordered in Other Visits  Medication Dose Route Frequency Provider Last Rate Last Admin   sodium chloride flush (NS) 0.9 % injection 10 mL  10 mL Intravenous PRN [name], MD   10 mL at 03/18/20 1033    PHYSICAL EXAMINATION: ECOG PERFORMANCE STATUS: 1 - Symptomatic but completely ambulatory  Vitals:   11/19/20 0852  BP: 126/87  Pulse: 66  Resp: 19  Temp: 97.8 F (36.6 C)  SpO2: 100%   Filed Weights   11/19/20 0852  Weight: 168 lb 8 oz (76.4 kg)    LABORATORY DATA:  I have reviewed the data as listed CMP Latest Ref Rng & Units 10/29/2020 10/09/2020 09/17/2020  Glucose 70 - 99 mg/dL 101(H) 114(H) 103(H)  BUN 6 - 20 mg/dL $Remove'9 11 9  'FBcLKql$ Creatinine 0.44 - 1.00 mg/dL 0.79 0.84 0.72  Sodium 135 - 145 mmol/L 139 141 142  Potassium 3.5 - 5.1 mmol/L 4.0 3.9 3.8  Chloride 98 - 111 mmol/L 103 106 103  CO2 22 - 32 mmol/L $RemoveB'26 26 30  'QcQoTNPO$ Calcium 8.9 - 10.3 mg/dL 9.5 9.6 10.0  Total Protein 6.5 - 8.1 g/dL 7.4 7.5 7.9  Total Bilirubin 0.3 - 1.2 mg/dL 0.2(L) 0.3 0.2(L)  Alkaline Phos 38 - 126 U/L 127(H) 121 156(H)  AST 15 - 41 U/L 33 27 28  ALT 0 - 44 U/L 59(H) 39 47(H)    Lab Results  Component Value Date   WBC 3.7 (L) 11/19/2020   HGB 10.3 (L) 11/19/2020   HCT 32.7 (L) 11/19/2020   MCV 80.5 11/19/2020   PLT 283  11/19/2020   NEUTROABS 2.3 11/19/2020    ASSESSMENT & PLAN:  Malignant neoplasm of upper-outer quadrant of left breast in female, estrogen receptor negative ([place]) Palpable left breast mass: 1.8 cm UOQ fibroadenoma in the right breast, left breast 3 masses 2 to 2:30 position: 1.7, 1.8, 1.4 cm.  Biopsy revealed IDC grade 3, ER 0%, PR 0%, Ki-67 80%, HER-2 -1+ T1c M0 stage IB   Treatment plan: 1.    Bilateral mastectomies 01/09/2020: Right mastectomy:PASH Left mastectomy: Multifocal IDC grade 3 2.2 cm largest, high-grade DCIS, margins negative, 0/4 sentinel lymph nodes, ER negative, PR negative, HER-2 negative, Ki-67 80% 2.  I recommended adjuvant chemotherapy with dose dense Adriamycin and Cytoxan pembrolizumab followed by Taxol, carboplatin and pembrolizumab completed 08/28/2020 3.  Maintenance Pembrolizumab ----------------------------------------------------------------------------------------------------------------------------------- Current treatment: Keytruda every three weeks (she will finish by end of December 2022) Labs reviewed   Chemo toxicities: 1.  Peripheral neuropathy: resolved 2. Liver enzyme elevation:  Monitoring labs closely  3.  Diffuse body aches and pains: Probably from [place].  I sent a prescription for magnesium today. 4.  Profound fatigue: TSH 0.9 on 10/29/2020. No immune mediated adverse effects.  Because of abdominal pain and muscle and joint aches and pains, would like to get a CT chest abdomen pelvis with contrast for further evaluation.   Return to clinic every 3 weeks for [facility] and follow-up on scans.        No orders of the defined types were placed in this encounter.  The patient has a good understanding of the overall plan. she agrees with it. she will call with any problems that may develop before the next visit here.  Total time spent: 30 mins including face to face time and time spent for planning, charting and coordination of care  Rulon Eisenmenger, MD, MPH 11/19/2020  I, Thana Ates, am acting as scribe for Dr. [name].  I have reviewed the above documentation for accuracy and completeness, and I agree with the above.

## 2020-11-19 ENCOUNTER — Other Ambulatory Visit: Payer: Self-pay

## 2020-11-19 ENCOUNTER — Inpatient Hospital Stay: Payer: BC Managed Care – PPO | Attending: Hematology and Oncology

## 2020-11-19 ENCOUNTER — Inpatient Hospital Stay: Payer: BC Managed Care – PPO

## 2020-11-19 ENCOUNTER — Inpatient Hospital Stay (HOSPITAL_BASED_OUTPATIENT_CLINIC_OR_DEPARTMENT_OTHER): Payer: BC Managed Care – PPO | Admitting: Hematology and Oncology

## 2020-11-19 DIAGNOSIS — T451X5D Adverse effect of antineoplastic and immunosuppressive drugs, subsequent encounter: Secondary | ICD-10-CM | POA: Insufficient documentation

## 2020-11-19 DIAGNOSIS — E039 Hypothyroidism, unspecified: Secondary | ICD-10-CM | POA: Diagnosis not present

## 2020-11-19 DIAGNOSIS — Z9013 Acquired absence of bilateral breasts and nipples: Secondary | ICD-10-CM | POA: Insufficient documentation

## 2020-11-19 DIAGNOSIS — C50412 Malignant neoplasm of upper-outer quadrant of left female breast: Secondary | ICD-10-CM

## 2020-11-19 DIAGNOSIS — Z171 Estrogen receptor negative status [ER-]: Secondary | ICD-10-CM | POA: Diagnosis not present

## 2020-11-19 DIAGNOSIS — Z5112 Encounter for antineoplastic immunotherapy: Secondary | ICD-10-CM | POA: Insufficient documentation

## 2020-11-19 DIAGNOSIS — D649 Anemia, unspecified: Secondary | ICD-10-CM | POA: Diagnosis not present

## 2020-11-19 DIAGNOSIS — R7401 Elevation of levels of liver transaminase levels: Secondary | ICD-10-CM | POA: Insufficient documentation

## 2020-11-19 DIAGNOSIS — D241 Benign neoplasm of right breast: Secondary | ICD-10-CM | POA: Diagnosis not present

## 2020-11-19 DIAGNOSIS — Z95828 Presence of other vascular implants and grafts: Secondary | ICD-10-CM

## 2020-11-19 DIAGNOSIS — G620 Drug-induced polyneuropathy: Secondary | ICD-10-CM | POA: Diagnosis not present

## 2020-11-19 DIAGNOSIS — Z803 Family history of malignant neoplasm of breast: Secondary | ICD-10-CM | POA: Insufficient documentation

## 2020-11-19 LAB — CMP (CANCER CENTER ONLY)
ALT: 43 U/L (ref 0–44)
AST: 25 U/L (ref 15–41)
Albumin: 4.2 g/dL (ref 3.5–5.0)
Alkaline Phosphatase: 127 U/L — ABNORMAL HIGH (ref 38–126)
Anion gap: 11 (ref 5–15)
BUN: 11 mg/dL (ref 6–20)
CO2: 26 mmol/L (ref 22–32)
Calcium: 9.9 mg/dL (ref 8.9–10.3)
Chloride: 103 mmol/L (ref 98–111)
Creatinine: 0.82 mg/dL (ref 0.44–1.00)
GFR, Estimated: >60 mL/min (ref >60)
Glucose, Bld: 106 mg/dL — ABNORMAL HIGH (ref 70–99)
Potassium: 4 mmol/L (ref 3.5–5.1)
Sodium: 140 mmol/L (ref 135–145)
Total Bilirubin: 0.3 mg/dL (ref 0.3–1.2)
Total Protein: 7.7 g/dL (ref 6.5–8.1)

## 2020-11-19 LAB — CBC WITH DIFFERENTIAL (CANCER CENTER ONLY)
Abs Immature Granulocytes: 0 10*3/uL (ref 0.00–0.07)
Basophils Absolute: 0 10*3/uL (ref 0.0–0.1)
Basophils Relative: 1 %
Eosinophils Absolute: 0.1 10*3/uL (ref 0.0–0.5)
Eosinophils Relative: 3 %
HCT: 32.7 % — ABNORMAL LOW (ref 36.0–46.0)
Hemoglobin: 10.3 g/dL — ABNORMAL LOW (ref 12.0–15.0)
Immature Granulocytes: 0 %
Lymphocytes Relative: 27 %
Lymphs Abs: 1 10*3/uL (ref 0.7–4.0)
MCH: 25.4 pg — ABNORMAL LOW (ref 26.0–34.0)
MCHC: 31.5 g/dL (ref 30.0–36.0)
MCV: 80.5 fL (ref 80.0–100.0)
Monocytes Absolute: 0.3 10*3/uL (ref 0.1–1.0)
Monocytes Relative: 7 %
Neutro Abs: 2.3 10*3/uL (ref 1.7–7.7)
Neutrophils Relative %: 62 %
Platelet Count: 283 10*3/uL (ref 150–400)
RBC: 4.06 MIL/uL (ref 3.87–5.11)
RDW: 14.2 % (ref 11.5–15.5)
WBC Count: 3.7 10*3/uL — ABNORMAL LOW (ref 4.0–10.5)
nRBC: 0 % (ref 0.0–0.2)

## 2020-11-19 LAB — TSH: TSH: 0.878 u[IU]/mL (ref 0.308–3.960)

## 2020-11-19 LAB — T4, FREE: Free T4: 0.87 ng/dL (ref 0.61–1.12)

## 2020-11-19 MED ORDER — SODIUM CHLORIDE 0.9% FLUSH
10.0000 mL | INTRAVENOUS | Status: DC | PRN
Start: 2020-11-19 — End: 2020-11-19
  Administered 2020-11-19: 10 mL

## 2020-11-19 MED ORDER — MAGNESIUM OXIDE -MG SUPPLEMENT 400 (240 MG) MG PO TABS: 400.0000 mg | ORAL_TABLET | Freq: Every day | ORAL | 6 refills | Status: DC

## 2020-11-19 MED ORDER — SODIUM CHLORIDE 0.9 % IV SOLN
200.0000 mg | Freq: Once | INTRAVENOUS | Status: AC
  Administered 2020-11-19: 200 mg via INTRAVENOUS
  Filled 2020-11-19: qty 8

## 2020-11-19 MED ORDER — SODIUM CHLORIDE 0.9 % IV SOLN: Freq: Once | INTRAVENOUS | Status: AC

## 2020-11-19 MED ORDER — SODIUM CHLORIDE 0.9% FLUSH
10.0000 mL | Freq: Once | INTRAVENOUS | Status: AC
  Administered 2020-11-19: 10 mL

## 2020-11-19 MED ORDER — HEPARIN SOD (PORK) LOCK FLUSH 100 UNIT/ML IV SOLN
500.0000 [IU] | Freq: Once | INTRAVENOUS | Status: AC | PRN
  Administered 2020-11-19: 500 [IU]

## 2020-11-19 NOTE — Assessment & Plan Note (Addendum)
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 Biopsy revealed IDC grade 3, ER 0%, PR 0%, Ki-67 80%, HER-2 -1+ T1c M0 stage IB  Treatment plan: 1.Bilateral mastectomies 01/09/2020: Right mastectomy:PASH Left mastectomy: Multifocal IDC grade 3 2.2 cm largest, high-grade DCIS, margins negative, 0/4 sentinel lymph nodes, ER negative, PR negative, HER-2 negative, Ki-67 80% 2.I recommended adjuvant chemotherapy with dose dense Adriamycin andCytoxanpembrolizumabfollowed by Taxol, carboplatin and pembrolizumab completed 08/28/2020 3.Maintenance Pembrolizumab ----------------------------------------------------------------------------------------------------------------------------------- Current treatment:Keytruda every three weeks (she will finish by end of December 2022) Labs reviewed  Chemo toxicities: 1.Peripheral neuropathy:resolved 2. Liver enzyme elevation:Monitoring labs closely  3.  Diffuse body aches and pains: Probably from [place]. 4.  Profound fatigue: TSH 0.9 on 10/29/2020. No immune mediated adverse effects.  Return to clinic every 3 weeks for Keytruda and labs and I will see her back in 6 weeks.

## 2020-11-19 NOTE — Patient Instructions (Signed)
[facility]COL[facility]  Discharge Instructions: Thank you for choosing [facility] to provide your oncology and hematology care.   If you have a lab appointment with the Cancer Center, please go directly to the Cancer Center and check in at the registration area.   Wear comfortable clothing and clothing appropriate for easy access to any Portacath or PICC line.   We strive to give you quality time with your provider. You may need to reschedule your appointment if you arrive late (15 or more minutes).  Arriving late affects you and other patients whose appointments are after yours.  Also, if you miss three or more appointments without notifying the office, you may be dismissed from the clinic at the provider's discretion.      For prescription refill requests, have your pharmacy contact our office and allow 72 hours for refills to be completed.    Today you received the following chemotherapy and/or immunotherapy agents Keytruda      To help prevent nausea and vomiting after your treatment, we encourage you to take your nausea medication as directed.  BELOW ARE SYMPTOMS THAT SHOULD BE REPORTED IMMEDIATELY: *FEVER GREATER THAN 100.4 F (38 C) OR HIGHER *CHILLS OR SWEATING *NAUSEA AND VOMITING THAT IS NOT CONTROLLED WITH YOUR NAUSEA MEDICATION *UNUSUAL SHORTNESS OF BREATH *UNUSUAL BRUISING OR BLEEDING *URINARY PROBLEMS (pain or burning when urinating, or frequent urination) *BOWEL PROBLEMS (unusual diarrhea, constipation, pain near the anus) TENDERNESS IN MOUTH AND THROAT WITH OR WITHOUT PRESENCE OF ULCERS (sore throat, sores in mouth, or a toothache) UNUSUAL RASH, SWELLING OR PAIN  UNUSUAL VAGINAL DISCHARGE OR ITCHING   Items with * indicate a potential emergency and should be followed up as soon as possible or go to the Emergency Department if any problems should occur.  Please show the CHEMOTHERAPY ALERT CARD or IMMUNOTHERAPY ALERT CARD at check-in to  the Emergency Department and triage nurse.  Should you have questions after your visit or need to cancel or reschedule your appointment, please contact [facility]  Dept: [phone]  and follow the prompts.  Office hours are 8:00 a.m. to 4:30 p.m. Monday - Friday. Please note that voicemails left after 4:00 p.m. may not be returned until the following business day.  We are closed weekends and major holidays. You have access to a nurse at all times for urgent questions. Please call the main number to the clinic Dept: [phone] and follow the prompts.   For any non-urgent questions, you may also contact your provider using MyChart. We now offer e-Visits for anyone 18 and older to request care online for non-urgent symptoms. For details visit [email or web address].   Also download the MyChart app! Go to the app store, search "MyChart", open the app, select , and log in with your MyChart username and password.  Due to Covid, a mask is required upon entering the hospital/clinic. If you do not have a mask, one will be given to you upon arrival. For doctor visits, patients may have 1 support person aged 18 or older with them. For treatment visits, patients cannot have anyone with them due to current Covid guidelines and our immunocompromised population.   Pembrolizumab injection What is this medication? PEMBROLIZUMAB (pem broe liz ue mab) is a monoclonal antibody. It is used totreat certain types of cancer. This medicine may be used for other purposes; ask your health care provider orpharmacist if you have questions. COMMON BRAND NAME(S): Keytruda What should I   tell my care team before I take this medication? They need to know if you have any of these conditions: autoimmune diseases like Crohn's disease, ulcerative colitis, or lupus have had or planning to have an allogeneic stem cell transplant (uses someone else's stem cells) history of organ  transplant history of chest radiation nervous system problems like myasthenia gravis or Guillain-Barre syndrome an unusual or allergic reaction to pembrolizumab, other medicines, foods, dyes, or preservatives pregnant or trying to get pregnant breast-feeding How should I use this medication? This medicine is for infusion into a vein. It is given by a health careprofessional in a hospital or clinic setting. A special MedGuide will be given to you before each treatment. Be sure to readthis information carefully each time. Talk to your pediatrician regarding the use of this medicine in children. While this drug may be prescribed for children as young as 6 months for selectedconditions, precautions do apply. Overdosage: If you think you have taken too much of this medicine contact apoison control center or emergency room at once. NOTE: This medicine is only for you. Do not share this medicine with others. What if I miss a dose? It is important not to miss your dose. Call your doctor or health careprofessional if you are unable to keep an appointment. What may interact with this medication? Interactions have not been studied. This list may not describe all possible interactions. Give your health care provider a list of all the medicines, herbs, non-prescription drugs, or dietary supplements you use. Also tell them if you smoke, drink alcohol, or use illegaldrugs. Some items may interact with your medicine. What should I watch for while using this medication? Your condition will be monitored carefully while you are receiving thismedicine. You may need blood work done while you are taking this medicine. Do not become pregnant while taking this medicine or for 4 months after stopping it. Women should inform their doctor if they wish to become pregnant or think they might be pregnant. There is a potential for serious side effects to an unborn child. Talk to your health care professional or pharmacist for  more information. Do not breast-feed an infant while taking this medicine orfor 4 months after the last dose. What side effects may I notice from receiving this medication? Side effects that you should report to your doctor or health care professionalas soon as possible: allergic reactions like skin rash, itching or hives, swelling of the face, lips, or tongue bloody or black, tarry breathing problems changes in vision chest pain chills confusion constipation cough diarrhea dizziness or feeling faint or lightheaded fast or irregular heartbeat fever flushing joint pain low blood counts - this medicine may decrease the number of white blood cells, red blood cells and platelets. You may be at increased risk for infections and bleeding. muscle pain muscle weakness pain, tingling, numbness in the hands or feet persistent headache redness, blistering, peeling or loosening of the skin, including inside the mouth signs and symptoms of high blood sugar such as dizziness; dry mouth; dry skin; fruity breath; nausea; stomach pain; increased hunger or thirst; increased urination signs and symptoms of kidney injury like trouble passing urine or change in the amount of urine signs and symptoms of liver injury like dark urine, light-colored stools, loss of appetite, nausea, right upper belly pain, yellowing of the eyes or skin sweating swollen lymph nodes weight loss Side effects that usually do not require medical attention (report to yourdoctor or health care professional if they continue   or are bothersome): decreased appetite hair loss tiredness This list may not describe all possible side effects. Call your doctor for medical advice about side effects. You may report side effects to FDA at1-800-FDA-1088. Where should I keep my medication? This drug is given in a hospital or clinic and will not be stored at home. NOTE: This sheet is a summary. It may not cover all possible information. If you  have questions about this medicine, talk to your doctor, pharmacist, orhealth care provider.  2022 Elsevier/Gold Standard (2019-01-30 21:44:53)   

## 2020-11-30 ENCOUNTER — Ambulatory Visit (HOSPITAL_COMMUNITY)
Admission: RE | Admit: 2020-11-30 | Discharge: 2020-11-30 | Disposition: A | Discharge status: Home / Self Care | Payer: BC Managed Care – PPO | Source: Ambulatory Visit | Attending: Hematology and Oncology | Admitting: Hematology and Oncology

## 2020-11-30 ENCOUNTER — Other Ambulatory Visit: Payer: Self-pay

## 2020-11-30 ENCOUNTER — Encounter (HOSPITAL_COMMUNITY): Payer: Self-pay

## 2020-11-30 DIAGNOSIS — C50412 Malignant neoplasm of upper-outer quadrant of left female breast: Secondary | ICD-10-CM | POA: Diagnosis present

## 2020-11-30 DIAGNOSIS — Z171 Estrogen receptor negative status [ER-]: Secondary | ICD-10-CM | POA: Diagnosis present

## 2020-11-30 MED ORDER — IOHEXOL 350 MG/ML SOLN
80.0000 mL | Freq: Once | INTRAVENOUS | Status: AC | PRN
  Administered 2020-11-30: 80 mL via INTRAVENOUS

## 2020-12-09 NOTE — Progress Notes (Signed)
Patient Care Team: Gara Kroner, DO as PCP - General (Family Medicine) [name], RN as Oncology Nurse Navigator [name], RN as Oncology Nurse Navigator [name], MD as Consulting Physician (General Surgery) [name], MD as Consulting Physician (Hematology and Oncology) [name], MD as Consulting Physician (Radiation Oncology)  DIAGNOSIS:    ICD-10-CM   1. Malignant neoplasm of upper-outer quadrant of left breast in female, estrogen receptor negative ([place])  C50.412    Z17.1       SUMMARY OF ONCOLOGIC HISTORY: Oncology History  Malignant neoplasm of upper-outer quadrant of left breast in female, estrogen receptor negative ([place])  11/29/2019 Initial Diagnosis   Patient palpated an area of concern in the left breast. Diagnostic mammogram and US showed a 1.8cm mass in the upper outer right breast representing a fibroadenoma, and in the left breast, three adjacent masses at the 2-2:30 position, 1.7cm, 0.8cm, and 1.4cm, with several cysts and no axillary adenopathy. Biopsy showed a benign fibroadenoma in the right breast, and in the left breast, IDC at the 2:30 position, grade 3, HER-2 negative (1+), ER+ 0%, PR+ 0%, Ki67 80%.    12/04/2019 Cancer Staging   Staging form: Breast, AJCC 8th Edition - Clinical: Stage IB (cT1c, cN0, cM0, G3, ER-, PR-, HER2-) - Signed by [name], MD on 12/04/2019   12/21/2019 Genetic Testing   Negative genetic testing:  No pathogenic variants detected on the [facility] Multi-Cancer Panel. A variant of uncertain significance (VUS) was detected in the CDK4 gene called c.415C>T (p.Arg139*). The report date is 12/21/2019.  The Multi-Cancer Panel offered by [facility] includes sequencing and/or deletion duplication testing of the following 85 genes: AIP, ALK, APC, ATM, AXIN2,BAP1,  BARD1, BLM, BMPR1A, BRCA1, BRCA2, BRIP1, CASR, CDC73, CDH1, CDK4, CDKN1B, CDKN1C, CDKN2A (p14ARF), CDKN2A (p16INK4a), CEBPA, CHEK2, CTNNA1, DICER1, DIS3L2, EGFR  (c.2369C>T, p.Thr790Met variant only), EPCAM (Deletion/duplication testing only), FH, FLCN, GATA2, GPC3, GREM1 (Promoter region deletion/duplication testing only), HOXB13 (c.251G>A, p.Gly84Glu), HRAS, KIT, MAX, MEN1, MET, MITF (c.952G>A, p.Glu318Lys variant only), MLH1, MSH2, MSH3, MSH6, MUTYH, NBN, NF1, NF2, NTHL1, PALB2, PDGFRA, PHOX2B, PMS2, POLD1, POLE, POT1, PRKAR1A, PTCH1, PTEN, RAD50, RAD51C, RAD51D, RB1, RECQL4, RET, RNF43, RUNX1, SDHAF2, SDHA (sequence changes only), SDHB, SDHC, SDHD, SMAD4, SMARCA4, SMARCB1, SMARCE1, STK11, SUFU, TERC, TERT, TMEM127, TP53, TSC1, TSC2, VHL, WRN and WT1.    01/09/2020 Surgery   Bilateral mastectomies with reconstruction [facility]):  Right breast: no evidence of malignancy Left breast: multifocal invasive ductal carcinoma, grade 3, largest 2.2cm, with high grade DCIS, clear margins, 4 left axillary lymph nodes negative for carcinoma.   03/18/2020 - 08/28/2020 Chemotherapy   Adriamycin, Cytoxan Keytruda followed by [facility]   09/17/2020 -  Chemotherapy    Patient is on Treatment Plan: BREAST PEMBROLIZUMAB Q21D         CHIEF COMPLIANT: Cycle 5 Keytruda  INTERVAL HISTORY: Amanda Burns is a 40 y.o. with above-mentioned history of breast cancer who underwent bilateral mastectomies with reconstruction. She presents to the clinic for cycle 5 of Keytruda.  She is tolerating Keytruda extremely well without any problems or concerns.  ALLERGIES:  is allergic to benzoyl peroxide and hydrocodone.  MEDICATIONS:  Current Outpatient Medications  Medication Sig Dispense Refill   acetaminophen (TYLENOL) 500 MG tablet Take 1,000 mg by mouth every 6 (six) hours as needed for moderate pain or headache.     amphetamine-dextroamphetamine (ADDERALL XR) 20 MG 24 hr capsule Take 20 mg by mouth daily.  BIOTIN PO Take by mouth.     bismuth subsalicylate (PEPTO BISMOL) 262 MG/15ML suspension Take 30 mLs by mouth every 6 (six) hours as needed for  indigestion or diarrhea or loose stools.     buPROPion (WELLBUTRIN XL) 150 MG 24 hr tablet Take 150 mg by mouth daily.     escitalopram (LEXAPRO) 20 MG tablet Take 20 mg by mouth daily.     fluticasone (FLONASE) 50 MCG/ACT nasal spray Place 1 spray into both nostrils daily as needed for allergies or rhinitis.     ibuprofen (ADVIL) 200 MG tablet Take 400 mg by mouth every 6 (six) hours as needed for headache or moderate pain.     magnesium oxide (MAG-OX) 400 (240 Mg) MG tablet Take 1 tablet (400 mg total) by mouth daily. 30 tablet 6   omeprazole (PRILOSEC) 20 MG capsule Take 40 mg by mouth daily.     No current facility-administered medications for this visit.   Facility-Administered Medications Ordered in Other Visits  Medication Dose Route Frequency Provider Last Rate Last Admin   sodium chloride flush (NS) 0.9 % injection 10 mL  10 mL Intravenous PRN [name], MD   10 mL at 03/18/20 1033    PHYSICAL EXAMINATION: ECOG PERFORMANCE STATUS: 1 - Symptomatic but completely ambulatory  Vitals:   12/10/20 0855  BP: 120/85  Pulse: (!) 110  Resp: 18  Temp: 97.7 F (36.5 C)  SpO2: 99%   Filed Weights   12/10/20 0855  Weight: 171 lb 6.4 oz (77.7 kg)     LABORATORY DATA:  I have reviewed the data as listed CMP Latest Ref Rng & Units 11/19/2020 10/29/2020 10/09/2020  Glucose 70 - 99 mg/dL 106(H) 101(H) 114(H)  BUN 6 - 20 mg/dL $Remove'11 9 11  'FeisoVu$ Creatinine 0.44 - 1.00 mg/dL 0.82 0.79 0.84  Sodium 135 - 145 mmol/L 140 139 141  Potassium 3.5 - 5.1 mmol/L 4.0 4.0 3.9  Chloride 98 - 111 mmol/L 103 103 106  CO2 22 - 32 mmol/L $RemoveB'26 26 26  'sVfjnivl$ Calcium 8.9 - 10.3 mg/dL 9.9 9.5 9.6  Total Protein 6.5 - 8.1 g/dL 7.7 7.4 7.5  Total Bilirubin 0.3 - 1.2 mg/dL 0.3 0.2(L) 0.3  Alkaline Phos 38 - 126 U/L 127(H) 127(H) 121  AST 15 - 41 U/L 25 33 27  ALT 0 - 44 U/L 43 59(H) 39    Lab Results  Component Value Date   WBC 4.5 12/10/2020   HGB 10.4 (L) 12/10/2020   HCT 33.3 (L) 12/10/2020   MCV 78.4 (L)  12/10/2020   PLT 301 12/10/2020   NEUTROABS 3.0 12/10/2020    ASSESSMENT & PLAN:  Malignant neoplasm of upper-outer quadrant of left breast in female, estrogen receptor negative ([facility]) Palpable left breast mass: 1.8 cm UOQ fibroadenoma in the right breast, left breast 3 masses 2 to 2:30 position: 1.7, 1.8, 1.4 cm.  Biopsy revealed IDC grade 3, ER 0%, PR 0%, Ki-67 80%, HER-2 -1+ T1c M0 stage IB   Treatment plan: 1.    Bilateral mastectomies 01/09/2020: Right mastectomy:PASH Left mastectomy: Multifocal IDC grade 3 2.2 cm largest, high-grade DCIS, margins negative, 0/4 sentinel lymph nodes, ER negative, PR negative, HER-2 negative, Ki-67 80% 2.  I recommended adjuvant chemotherapy with dose dense Adriamycin and Cytoxan pembrolizumab followed by Taxol, carboplatin and pembrolizumab completed 08/28/2020 3.  Maintenance Pembrolizumab ----------------------------------------------------------------------------------------------------------------------------------- Current treatment: Keytruda every three weeks (she will finish by end of December 2022) Labs reviewed   Chemo toxicities: 1.  Peripheral neuropathy:  resolved 2. Liver enzyme elevation: Monitoring labs closely  3.  Diffuse body aches and pains: Probably from [place].  I sent a prescription for magnesium today. 4.  Profound fatigue: TSH 0.9 on 10/29/2020. No immune mediated adverse effects.   Abdominal pain and muscle and joint aches and pains: 11/30/2020: Liver: 2.1 cm enhancement nonspecific, no definite metastatic disease.  Left iliac bone lytic and sclerotic lesion: Nonspecific, multiple thyroid nodules. Bone lesions: We will obtain a bone scan. I discussed with her if the bone scan shows activity then we will need to biopsy the bone.  We will get a liver MRI for further evaluation (after she undergoes implant replacement) this will be at the end of November.  I will call her with the results of the bone scan.  Return to clinic  every 3 weeks for [facility] and follow-up with me in 6 weeks.    No orders of the defined types were placed in this encounter.  The patient has a good understanding of the overall plan. she agrees with it. she will call with any problems that may develop before the next visit here.  Total time spent: 30 mins including face to face time and time spent for planning, charting and coordination of care  Rulon Eisenmenger, MD, MPH 12/10/2020  I, Thana Ates, am acting as scribe for Dr. [name].  I have reviewed the above documentation for accuracy and completeness, and I agree with the above.

## 2020-12-10 ENCOUNTER — Other Ambulatory Visit: Payer: Self-pay

## 2020-12-10 ENCOUNTER — Inpatient Hospital Stay: Payer: BC Managed Care – PPO

## 2020-12-10 ENCOUNTER — Inpatient Hospital Stay (HOSPITAL_BASED_OUTPATIENT_CLINIC_OR_DEPARTMENT_OTHER): Payer: BC Managed Care – PPO | Admitting: Hematology and Oncology

## 2020-12-10 ENCOUNTER — Encounter: Payer: Self-pay | Admitting: *Deleted

## 2020-12-10 VITALS — HR 93

## 2020-12-10 DIAGNOSIS — Z5112 Encounter for antineoplastic immunotherapy: Secondary | ICD-10-CM | POA: Diagnosis not present

## 2020-12-10 DIAGNOSIS — C50412 Malignant neoplasm of upper-outer quadrant of left female breast: Secondary | ICD-10-CM | POA: Diagnosis not present

## 2020-12-10 DIAGNOSIS — Z171 Estrogen receptor negative status [ER-]: Secondary | ICD-10-CM

## 2020-12-10 DIAGNOSIS — Z95828 Presence of other vascular implants and grafts: Secondary | ICD-10-CM

## 2020-12-10 LAB — TSH: TSH: 0.891 u[IU]/mL (ref 0.308–3.960)

## 2020-12-10 LAB — CBC WITH DIFFERENTIAL (CANCER CENTER ONLY)
Abs Immature Granulocytes: 0.01 10*3/uL (ref 0.00–0.07)
Basophils Absolute: 0 10*3/uL (ref 0.0–0.1)
Basophils Relative: 1 %
Eosinophils Absolute: 0.1 10*3/uL (ref 0.0–0.5)
Eosinophils Relative: 3 %
HCT: 33.3 % — ABNORMAL LOW (ref 36.0–46.0)
Hemoglobin: 10.4 g/dL — ABNORMAL LOW (ref 12.0–15.0)
Immature Granulocytes: 0 %
Lymphocytes Relative: 23 %
Lymphs Abs: 1 10*3/uL (ref 0.7–4.0)
MCH: 24.5 pg — ABNORMAL LOW (ref 26.0–34.0)
MCHC: 31.2 g/dL (ref 30.0–36.0)
MCV: 78.4 fL — ABNORMAL LOW (ref 80.0–100.0)
Monocytes Absolute: 0.3 10*3/uL (ref 0.1–1.0)
Monocytes Relative: 7 %
Neutro Abs: 3 10*3/uL (ref 1.7–7.7)
Neutrophils Relative %: 66 %
Platelet Count: 301 10*3/uL (ref 150–400)
RBC: 4.25 MIL/uL (ref 3.87–5.11)
RDW: 14.4 % (ref 11.5–15.5)
WBC Count: 4.5 10*3/uL (ref 4.0–10.5)
nRBC: 0 % (ref 0.0–0.2)

## 2020-12-10 LAB — CMP (CANCER CENTER ONLY)
ALT: 34 U/L (ref 0–44)
AST: 23 U/L (ref 15–41)
Albumin: 4 g/dL (ref 3.5–5.0)
Alkaline Phosphatase: 120 U/L (ref 38–126)
Anion gap: 11 (ref 5–15)
BUN: 10 mg/dL (ref 6–20)
CO2: 25 mmol/L (ref 22–32)
Calcium: 9.5 mg/dL (ref 8.9–10.3)
Chloride: 103 mmol/L (ref 98–111)
Creatinine: 0.82 mg/dL (ref 0.44–1.00)
GFR, Estimated: >60 mL/min (ref >60)
Glucose, Bld: 122 mg/dL — ABNORMAL HIGH (ref 70–99)
Potassium: 3.9 mmol/L (ref 3.5–5.1)
Sodium: 139 mmol/L (ref 135–145)
Total Bilirubin: 0.2 mg/dL — ABNORMAL LOW (ref 0.3–1.2)
Total Protein: 7.3 g/dL (ref 6.5–8.1)

## 2020-12-10 LAB — T4, FREE: Free T4: 0.85 ng/dL (ref 0.61–1.12)

## 2020-12-10 MED ORDER — SODIUM CHLORIDE 0.9 % IV SOLN
200.0000 mg | Freq: Once | INTRAVENOUS | Status: AC
  Administered 2020-12-10: 200 mg via INTRAVENOUS
  Filled 2020-12-10: qty 8

## 2020-12-10 MED ORDER — SODIUM CHLORIDE 0.9% FLUSH
10.0000 mL | Freq: Once | INTRAVENOUS | Status: AC
  Administered 2020-12-10: 10 mL

## 2020-12-10 MED ORDER — SODIUM CHLORIDE 0.9 % IV SOLN: Freq: Once | INTRAVENOUS | Status: AC

## 2020-12-10 MED ORDER — HEPARIN SOD (PORK) LOCK FLUSH 100 UNIT/ML IV SOLN
500.0000 [IU] | Freq: Once | INTRAVENOUS | Status: AC | PRN
  Administered 2020-12-10: 500 [IU]

## 2020-12-10 MED ORDER — SODIUM CHLORIDE 0.9% FLUSH
10.0000 mL | INTRAVENOUS | Status: DC | PRN
  Administered 2020-12-10: 10 mL

## 2020-12-10 NOTE — Patient Instructions (Signed)
[facility]  ° Discharge Instructions: °Thank you for choosing [facility] to provide your oncology and hematology care.  ° °If you have a lab appointment with the Cancer Center, please go directly to the Cancer Center and check in at the registration area. °  °Wear comfortable clothing and clothing appropriate for easy access to any Portacath or PICC line.  ° °We strive to give you quality time with your provider. You may need to reschedule your appointment if you arrive late (15 or more minutes).  Arriving late affects you and other patients whose appointments are after yours.  Also, if you miss three or more appointments without notifying the office, you may be dismissed from the clinic at the provider’s discretion.    °  °For prescription refill requests, have your pharmacy contact our office and allow 72 hours for refills to be completed.   ° °Today you received the following chemotherapy and/or immunotherapy agents: Pembrolizumab (Keytruda) °  °To help prevent nausea and vomiting after your treatment, we encourage you to take your nausea medication as directed. ° °BELOW ARE SYMPTOMS THAT SHOULD BE REPORTED IMMEDIATELY: °*FEVER GREATER THAN 100.4 F (38 °C) OR HIGHER °*CHILLS OR SWEATING °*NAUSEA AND VOMITING THAT IS NOT CONTROLLED WITH YOUR NAUSEA MEDICATION °*UNUSUAL SHORTNESS OF BREATH °*UNUSUAL BRUISING OR BLEEDING °*URINARY PROBLEMS (pain or burning when urinating, or frequent urination) °*BOWEL PROBLEMS (unusual diarrhea, constipation, pain near the anus) °TENDERNESS IN MOUTH AND THROAT WITH OR WITHOUT PRESENCE OF ULCERS (sore throat, sores in mouth, or a toothache) °UNUSUAL RASH, SWELLING OR PAIN  °UNUSUAL VAGINAL DISCHARGE OR ITCHING  ° °Items with * indicate a potential emergency and should be followed up as soon as possible or go to the Emergency Department if any problems should occur. ° °Please show the CHEMOTHERAPY ALERT CARD or IMMUNOTHERAPY ALERT CARD  at check-in to the Emergency Department and triage nurse. ° °Should you have questions after your visit or need to cancel or reschedule your appointment, please contact [facility]  Dept: [phone]  and follow the prompts.  Office hours are 8:00 a.m. to 4:30 p.m. Monday - Friday. Please note that voicemails left after 4:00 p.m. may not be returned until the following business day.  We are closed weekends and major holidays. You have access to a nurse at all times for urgent questions. Please call the main number to the clinic Dept: [phone] and follow the prompts. ° ° °For any non-urgent questions, you may also contact your provider using MyChart. We now offer e-Visits for anyone 18 and older to request care online for non-urgent symptoms. For details visit [email or web address]. °  °Also download the MyChart app! Go to the app store, search "MyChart", open the app, select [facility], and log in with your MyChart username and password. ° °Due to Covid, a mask is required upon entering the hospital/clinic. If you do not have a mask, one will be given to you upon arrival. For doctor visits, patients may have 1 support person aged 18 or older with them. For treatment visits, patients cannot have anyone with them due to current Covid guidelines and our immunocompromised population.  ° °

## 2020-12-10 NOTE — Assessment & Plan Note (Signed)
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 Biopsy revealed IDC grade 3, ER 0%, PR 0%, Ki-67 80%, HER-2 -1+ T1c M0 stage IB  Treatment plan: 1.Bilateral mastectomies 01/09/2020: Right mastectomy:PASH Left mastectomy: Multifocal IDC grade 3 2.2 cm largest, high-grade DCIS, margins negative, 0/4 sentinel lymph nodes, ER negative, PR negative, HER-2 negative, Ki-67 80% 2.I recommended adjuvant chemotherapy with dose dense Adriamycin andCytoxanpembrolizumabfollowed by Taxol, carboplatin and pembrolizumabcompleted 08/28/2020 3.Maintenance Pembrolizumab ----------------------------------------------------------------------------------------------------------------------------------- Current treatment:Keytruda every three weeks(she will finish by end of December 2022) Labs reviewed  Chemo toxicities: 1.Peripheral neuropathy:resolved 2. Liver enzyme elevation:Monitoring labs closely 3.Diffuse body aches and pains: Probably from [place].  I sent a prescription for magnesium today. 4.Profound fatigue: TSH 0.9 on 10/29/2020. No immune mediated adverse effects.  Abdominal pain and muscle and joint aches and pains: 11/30/2020: Liver: 2.1 cm enhancement nonspecific, no definite metastatic disease.  Left iliac bone lytic and sclerotic lesion: Nonspecific, multiple thyroid nodules. We will get a liver MRI for further evaluation  Return to clinic every 3 weeks for [facility] and follow-up

## 2020-12-15 ENCOUNTER — Telehealth: Payer: Self-pay | Admitting: *Deleted

## 2020-12-15 ENCOUNTER — Other Ambulatory Visit: Payer: Self-pay | Admitting: Hematology and Oncology

## 2020-12-15 ENCOUNTER — Encounter: Payer: Self-pay | Admitting: General Practice

## 2020-12-15 DIAGNOSIS — Z171 Estrogen receptor negative status [ER-]: Secondary | ICD-10-CM

## 2020-12-15 DIAGNOSIS — C50412 Malignant neoplasm of upper-outer quadrant of left female breast: Secondary | ICD-10-CM

## 2020-12-15 MED ORDER — ALPRAZOLAM 0.5 MG PO TABS
0.5000 mg | ORAL_TABLET | Freq: Three times a day (TID) | ORAL | 1 refills | Status: DC | PRN
Start: 2020-12-15 — End: 2021-06-28

## 2020-12-15 NOTE — Progress Notes (Signed)
[facility] Spiritual Care Note  Referred by Dr Lindi Adie and Merleen Nicely DeSota/RN for emotional support, as Margurete was tearful today with high anxiety about her scans for tomorrow. Reached her by phone to introduce Spiritual Care as part of her support team.   Melba had just picked up her new Xanax prescription and is hopeful for some relief from the fearful despair she is feeling: "I've never felt this way before, not even when I found out I had cancer." She is particularly fearful of dying because she wants to be here for her two children, ages 52 and 20.  Provided pastoral listening, normalization of feelings, emotional support, and help thinking through self-care strategies for the coming hours. She prefers to be home and alone (before school lets out), rather than out and about/with other people. She has plans for leaf-blowing and outdoor play with her kids in the afternoon.  We plan to connect by phone after her scans tomorrow, and [facility] has my direct-dial number to empower her to reach out as needed/desired.   [place], [place], [facility] Pager [phone] Voicemail [phone]

## 2020-12-15 NOTE — Progress Notes (Signed)
Patient is extremely anxious and tearful. Sent prescription for Xanax to be taken as needed for anxiety.

## 2020-12-15 NOTE — Telephone Encounter (Signed)
Received call from tearful pt with complaint of increase in anxiety due to upcoming bone scan and cancer diagnosis.  Pt requesting PRN medication be sent to pharmacy on file.  RN will reach out to [place] to provide emotional support and RN will review with MD for further recommendations.

## 2020-12-16 ENCOUNTER — Encounter: Payer: Self-pay | Admitting: General Practice

## 2020-12-16 ENCOUNTER — Other Ambulatory Visit: Payer: Self-pay

## 2020-12-16 ENCOUNTER — Encounter (HOSPITAL_COMMUNITY)
Admission: RE | Admit: 2020-12-16 | Discharge: 2020-12-16 | Disposition: A | Discharge status: Home / Self Care | Payer: BC Managed Care – PPO | Source: Ambulatory Visit | Attending: Hematology and Oncology | Admitting: Hematology and Oncology

## 2020-12-16 DIAGNOSIS — Z171 Estrogen receptor negative status [ER-]: Secondary | ICD-10-CM | POA: Insufficient documentation

## 2020-12-16 DIAGNOSIS — C50412 Malignant neoplasm of upper-outer quadrant of left female breast: Secondary | ICD-10-CM

## 2020-12-16 MED ORDER — TECHNETIUM TC 99M MEDRONATE IV KIT
20.0000 | PACK | Freq: Once | INTRAVENOUS | Status: AC | PRN
  Administered 2020-12-16: 20.6 via INTRAVENOUS

## 2020-12-16 NOTE — Progress Notes (Signed)
[facility] Spiritual Care Note  Made follow-up phone call as planned. Amanda Burns was in a much lighter and brighter emotional place this afternoon, citing relief at being done with the scans and being out for lunch with her mom. She also noted that the xanax was helpful to her in her hardest spot yesterday.   We plan to follow up by phone later in the week for another pastoral check-in.   [place], [place], [facility] Pager [phone] Voicemail [phone]

## 2020-12-18 ENCOUNTER — Telehealth: Payer: Self-pay | Admitting: Hematology and Oncology

## 2020-12-18 ENCOUNTER — Encounter: Payer: Self-pay | Admitting: General Practice

## 2020-12-18 ENCOUNTER — Encounter: Payer: Self-pay | Admitting: Hematology and Oncology

## 2020-12-18 ENCOUNTER — Encounter (HOSPITAL_COMMUNITY): Payer: BC Managed Care – PPO

## 2020-12-18 NOTE — Telephone Encounter (Signed)
I discussed with the patient that the bone scan does not show any evidence of bone metastases. The findings on the CT scan could be benign changes in the bone. We decided to watch and monitor once a year with CT and bone scans.

## 2020-12-18 NOTE — Progress Notes (Signed)
[facility] Spiritual Care Note  Followed up with Amanda Burns by phone as planned. She noted that she is still doing well, is anticipating good outcomes, and will reach out as needed/desired.    [place], [place], [facility] Pager [phone] Voicemail [phone]

## 2021-01-01 ENCOUNTER — Other Ambulatory Visit: Payer: Self-pay

## 2021-01-01 ENCOUNTER — Other Ambulatory Visit: Payer: Self-pay | Admitting: Oncology

## 2021-01-01 ENCOUNTER — Inpatient Hospital Stay: Payer: BC Managed Care – PPO

## 2021-01-05 ENCOUNTER — Encounter (HOSPITAL_BASED_OUTPATIENT_CLINIC_OR_DEPARTMENT_OTHER): Payer: Self-pay | Admitting: Plastic Surgery

## 2021-01-05 ENCOUNTER — Other Ambulatory Visit: Payer: Self-pay

## 2021-01-06 NOTE — Progress Notes (Signed)

## 2021-01-06 NOTE — H&P (Signed)
Subjective:     Patient ID: Amanda Burns is a 40 y.o. female.   HPI   11 months post op. Plan implant exchange next month. Since last visit has undergone repeat chest CT and bone scan- nonspecific findings noted liver and bone with no evidence metastatic disease. Discussed with [name] monitor once a year with CT and bone scans.   Presented with palpable left breast mass. Diagnostic MMG/US showed a 1.8cm mass in the RIGHT UOQ suggestive of a fibroadenoma, and in the LEFT breast, three adjacent masses at the 2-2:30 position spanning 5.2 cm, 1.7cm, 0.8cm, and 1.4cm, no axillary adenopathy. Biopsy RIGHT breast mass with fibroadenoma, and in the LEFT breast, biopsy labeled 2 o clock with IDC, triple negative and left breast 2:30 with ADH, ALH involving a fibroadenoma.   MRI demonstrated biopsy-proven malignancy LEFT UOQ 3.8 x 2.1 x 2.8 cm. An additional mass LEFT LOQ  1.6 may represent multicentric disease or fibroadenoma. A single prominent RIGHT axillary LN noted. No evidence of metastatic lymphadenopathy noted in LEFT axilla.    Second-look US of the LEFT breast and RIGHT axilla completed. No pathologic RIGHT axillary lymphadenopathy  identified. The LN identified on MRI had normal morphology/cortical thickness. Biopsy labeled LEFT breast 4 o clock LOQ 6cmfn with ADH, ALH involving fibroadenoma.   Final pathology right breast PASH, fibroadenoma. Left breast multifocal IDC largest 2.2 cm with DCIS, margins clear, PASH, fibroadenoma. 0/4 SLN Course complication by left mastectomy flap necrosis and underwent debridement, closure in office.   Adjuvant chemotherapy stopped June 2022 due to neuropathy, continues on Pembrolizumab through end December 2022.    Genetics with VUS detected in the CDK4. MGM and PA with breast ca.   Prior 40 D. Right mastectomy 1063 g Left 1014 g   Lives with two sons. 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   Review of Systems       Objective:   Physical Exam Cardiovascular:     Rate and Rhythm: Normal rate and regular rhythm.     Heart sounds: Normal heart sounds.  Pulmonary:     Effort: Pulmonary effort is normal.     Breath sounds: Normal breath sounds.     Chest: soft bilateral, bilateral chest expanded Left upper chest faint rippling noted when bending forward Bilateral lateral chest with soft tissue fullness No lateral displacement in supine position   Abd: soft no hernias sufficient soft tissue for donor site   Right chest port    Assessment:     Left breast ca UOQ ER- S/p bilateral SRM, left SLN, prepectoral TE/ADM (Alloderm) reconstruction Adjuvant chemotherapy    Plan:       Plan removal bilateral TE and placement silicone implants, lipofilling to bilateral chest, liposuction bilateral lateral chest wall.   Reviewed saline vs silicone. As in prepectoral position recommend HCG or capacity filled gel implants to reduce risk rippling. Reviewed need for imaging surveillance silicone implants. Reviewed smooth vs textured implant and risk ALCL with latter. Patient has elected for silicone, plan smooth round. Reviewed implants are not permanent devices and may require additional surgery.    Discussed possible fat grafting. Reviewed variable take graft, may need to repeat procedure, donor site pain and need for compression, fat necrosis that presents as masses. Patient desires to proceed, plan abdominal donor site. Counseled aesthetically may not note change in contour or projection abdomen. Recommend she purchase Spanx type garment for post op use.   Bilateral lateral chest soft tissue  fullness. Plan liposuction bilateral lateral chest wall. Reviewed pain bruising expected in areas of liposuction.   Additional risks including but not limited to bleeding seroma hematoma asymmetry unacceptable cosmetic result damage to adjacent structures blood clots in legs or lungs reviewed.    Plan OP surgery, no  drains anticipated. Completed Herma Carson physician patient checklist.   Rx for oxycodone bactrim and robaxin given.       Natrelle [id number] 500 ml tissue expanders placed bilateral,  fill volume 430 ml saline bilateral

## 2021-01-15 ENCOUNTER — Encounter (HOSPITAL_BASED_OUTPATIENT_CLINIC_OR_DEPARTMENT_OTHER)
Admission: RE | Disposition: A | Discharge status: Home / Self Care | Payer: Self-pay | Source: Home / Self Care | Attending: Plastic Surgery

## 2021-01-15 ENCOUNTER — Ambulatory Visit (HOSPITAL_BASED_OUTPATIENT_CLINIC_OR_DEPARTMENT_OTHER)
Admission: RE | Admit: 2021-01-15 | Discharge: 2021-01-15 | Disposition: A | Discharge status: Home / Self Care | Payer: BC Managed Care – PPO | Attending: Plastic Surgery | Admitting: Plastic Surgery

## 2021-01-15 ENCOUNTER — Other Ambulatory Visit: Payer: Self-pay

## 2021-01-15 ENCOUNTER — Ambulatory Visit (HOSPITAL_BASED_OUTPATIENT_CLINIC_OR_DEPARTMENT_OTHER): Payer: BC Managed Care – PPO | Admitting: Certified Registered"

## 2021-01-15 ENCOUNTER — Encounter (HOSPITAL_BASED_OUTPATIENT_CLINIC_OR_DEPARTMENT_OTHER): Payer: Self-pay | Admitting: Plastic Surgery

## 2021-01-15 DIAGNOSIS — Z171 Estrogen receptor negative status [ER-]: Secondary | ICD-10-CM | POA: Diagnosis not present

## 2021-01-15 DIAGNOSIS — Z803 Family history of malignant neoplasm of breast: Secondary | ICD-10-CM | POA: Insufficient documentation

## 2021-01-15 DIAGNOSIS — Z421 Encounter for breast reconstruction following mastectomy: Secondary | ICD-10-CM | POA: Insufficient documentation

## 2021-01-15 DIAGNOSIS — Z79899 Other long term (current) drug therapy: Secondary | ICD-10-CM | POA: Diagnosis not present

## 2021-01-15 DIAGNOSIS — C50412 Malignant neoplasm of upper-outer quadrant of left female breast: Secondary | ICD-10-CM | POA: Diagnosis not present

## 2021-01-15 DIAGNOSIS — Z9221 Personal history of antineoplastic chemotherapy: Secondary | ICD-10-CM | POA: Insufficient documentation

## 2021-01-15 DIAGNOSIS — Z9013 Acquired absence of bilateral breasts and nipples: Secondary | ICD-10-CM | POA: Diagnosis not present

## 2021-01-15 HISTORY — PX: LIPOSUCTION WITH LIPOFILLING: SHX6436

## 2021-01-15 HISTORY — PX: REMOVAL OF BILATERAL TISSUE EXPANDERS WITH PLACEMENT OF BILATERAL BREAST IMPLANTS: SHX6431

## 2021-01-15 LAB — POCT PREGNANCY, URINE: Preg Test, Ur: NEGATIVE

## 2021-01-15 SURGERY — REMOVAL, TISSUE EXPANDER, BREAST, BILATERAL, WITH BILATERAL IMPLANT IMPLANT INSERTION
Anesthesia: General | Site: Chest | Laterality: Bilateral

## 2021-01-15 MED ORDER — DEXAMETHASONE SODIUM PHOSPHATE 10 MG/ML IJ SOLN
INTRAMUSCULAR | Status: AC
  Filled 2021-01-15: qty 1

## 2021-01-15 MED ORDER — PHENYLEPHRINE 40 MCG/ML (10ML) SYRINGE FOR IV PUSH (FOR BLOOD PRESSURE SUPPORT)
PREFILLED_SYRINGE | INTRAVENOUS | Status: DC | PRN
  Administered 2021-01-15 (×2): 80 ug via INTRAVENOUS

## 2021-01-15 MED ORDER — PROPOFOL 10 MG/ML IV BOLUS
INTRAVENOUS | Status: DC | PRN
  Administered 2021-01-15: 170 mg via INTRAVENOUS

## 2021-01-15 MED ORDER — MIDAZOLAM HCL 2 MG/2ML IJ SOLN
INTRAMUSCULAR | Status: AC
  Filled 2021-01-15: qty 2

## 2021-01-15 MED ORDER — FENTANYL CITRATE (PF) 100 MCG/2ML IJ SOLN
INTRAMUSCULAR | Status: AC
  Filled 2021-01-15: qty 2

## 2021-01-15 MED ORDER — PROPOFOL 500 MG/50ML IV EMUL
INTRAVENOUS | Status: AC
  Filled 2021-01-15: qty 50

## 2021-01-15 MED ORDER — FENTANYL CITRATE (PF) 100 MCG/2ML IJ SOLN
25.0000 ug | INTRAMUSCULAR | Status: DC | PRN
  Administered 2021-01-15: 50 ug via INTRAVENOUS

## 2021-01-15 MED ORDER — SODIUM BICARBONATE 4.2 % IV SOLN
INTRAVENOUS | Status: DC | PRN
  Administered 2021-01-15: 500 mL via INTRAMUSCULAR

## 2021-01-15 MED ORDER — CELECOXIB 200 MG PO CAPS
200.0000 mg | ORAL_CAPSULE | ORAL | Status: AC
  Administered 2021-01-15: 200 mg via ORAL

## 2021-01-15 MED ORDER — SODIUM CHLORIDE 0.9 % IV SOLN
INTRAVENOUS | Status: DC | PRN
  Administered 2021-01-15: 500 mL

## 2021-01-15 MED ORDER — PHENYLEPHRINE HCL (PRESSORS) 10 MG/ML IV SOLN
INTRAVENOUS | Status: AC
  Filled 2021-01-15: qty 1

## 2021-01-15 MED ORDER — PHENYLEPHRINE 40 MCG/ML (10ML) SYRINGE FOR IV PUSH (FOR BLOOD PRESSURE SUPPORT)
PREFILLED_SYRINGE | INTRAVENOUS | Status: AC
  Filled 2021-01-15: qty 10

## 2021-01-15 MED ORDER — LIDOCAINE HCL (CARDIAC) PF 100 MG/5ML IV SOSY
PREFILLED_SYRINGE | INTRAVENOUS | Status: DC | PRN
  Administered 2021-01-15: 60 mg via INTRAVENOUS

## 2021-01-15 MED ORDER — GABAPENTIN 300 MG PO CAPS
ORAL_CAPSULE | ORAL | Status: AC
  Filled 2021-01-15: qty 1

## 2021-01-15 MED ORDER — ONDANSETRON HCL 4 MG/2ML IJ SOLN
INTRAMUSCULAR | Status: DC | PRN
  Administered 2021-01-15: 4 mg via INTRAVENOUS

## 2021-01-15 MED ORDER — FENTANYL CITRATE (PF) 100 MCG/2ML IJ SOLN
INTRAMUSCULAR | Status: DC | PRN
  Administered 2021-01-15 (×2): 50 ug via INTRAVENOUS

## 2021-01-15 MED ORDER — DIPHENHYDRAMINE HCL 50 MG/ML IJ SOLN
25.0000 mg | Freq: Once | INTRAMUSCULAR | Status: AC
  Administered 2021-01-15: 12.5 mg via INTRAVENOUS

## 2021-01-15 MED ORDER — SODIUM CHLORIDE 0.9 % IV SOLN
INTRAVENOUS | Status: AC
  Filled 2021-01-15: qty 10

## 2021-01-15 MED ORDER — DEXAMETHASONE SODIUM PHOSPHATE 4 MG/ML IJ SOLN
INTRAMUSCULAR | Status: DC | PRN
  Administered 2021-01-15: 10 mg via INTRAVENOUS

## 2021-01-15 MED ORDER — CELECOXIB 200 MG PO CAPS
ORAL_CAPSULE | ORAL | Status: AC
  Filled 2021-01-15: qty 1

## 2021-01-15 MED ORDER — HYDROMORPHONE HCL 1 MG/ML IJ SOLN
0.5000 mg | Freq: Once | INTRAMUSCULAR | Status: AC
  Administered 2021-01-15: 0.5 mg via INTRAVENOUS

## 2021-01-15 MED ORDER — CEFAZOLIN SODIUM-DEXTROSE 2-4 GM/100ML-% IV SOLN
2.0000 g | INTRAVENOUS | Status: AC
  Administered 2021-01-15: 2 g via INTRAVENOUS

## 2021-01-15 MED ORDER — MIDAZOLAM HCL 2 MG/2ML IJ SOLN
2.0000 mg | Freq: Once | INTRAMUSCULAR | Status: AC
  Administered 2021-01-15: 0.5 mg via INTRAVENOUS

## 2021-01-15 MED ORDER — HYDROMORPHONE HCL 1 MG/ML IJ SOLN
INTRAMUSCULAR | Status: AC
  Filled 2021-01-15: qty 0.5

## 2021-01-15 MED ORDER — DIPHENHYDRAMINE HCL 50 MG/ML IJ SOLN
INTRAMUSCULAR | Status: AC
  Filled 2021-01-15: qty 1

## 2021-01-15 MED ORDER — ACETAMINOPHEN 500 MG PO TABS
1000.0000 mg | ORAL_TABLET | ORAL | Status: AC
  Administered 2021-01-15: 1000 mg via ORAL

## 2021-01-15 MED ORDER — CHLORHEXIDINE GLUCONATE CLOTH 2 % EX PADS: 6.0000 | MEDICATED_PAD | Freq: Once | CUTANEOUS | Status: DC

## 2021-01-15 MED ORDER — PHENYLEPHRINE HCL-NACL 20-0.9 MG/250ML-% IV SOLN
INTRAVENOUS | Status: DC | PRN
  Administered 2021-01-15: 50 ug/min via INTRAVENOUS

## 2021-01-15 MED ORDER — MIDAZOLAM HCL 5 MG/5ML IJ SOLN
INTRAMUSCULAR | Status: DC | PRN
Start: 2021-01-15 — End: 2021-01-15
  Administered 2021-01-15: 2 mg via INTRAVENOUS

## 2021-01-15 MED ORDER — ACETAMINOPHEN 500 MG PO TABS
ORAL_TABLET | ORAL | Status: AC
  Filled 2021-01-15: qty 2

## 2021-01-15 MED ORDER — LACTATED RINGERS IV SOLN: INTRAVENOUS | Status: DC

## 2021-01-15 MED ORDER — GABAPENTIN 300 MG PO CAPS
300.0000 mg | ORAL_CAPSULE | ORAL | Status: AC
  Administered 2021-01-15: 300 mg via ORAL

## 2021-01-15 MED ORDER — ONDANSETRON HCL 4 MG/2ML IJ SOLN
INTRAMUSCULAR | Status: AC
  Filled 2021-01-15: qty 2

## 2021-01-15 MED ORDER — CEFAZOLIN SODIUM-DEXTROSE 2-4 GM/100ML-% IV SOLN
INTRAVENOUS | Status: AC
  Filled 2021-01-15: qty 100

## 2021-01-15 MED ORDER — LIDOCAINE 2% (20 MG/ML) 5 ML SYRINGE
INTRAMUSCULAR | Status: AC
  Filled 2021-01-15: qty 5

## 2021-01-15 SURGICAL SUPPLY — 62 items
ADH SKN CLS APL DERMABOND .7 (GAUZE/BANDAGES/DRESSINGS) ×4
APL PRP STRL LF DISP 70% ISPRP (MISCELLANEOUS) ×6
BAG DECANTER FOR FLEXI CONT (MISCELLANEOUS) ×3 IMPLANT
BINDER ABDOMINAL  9 SM 30-45 (SOFTGOODS) ×3
BINDER ABDOMINAL 9 SM 30-45 (SOFTGOODS) ×2 IMPLANT
BINDER BREAST XLRG (GAUZE/BANDAGES/DRESSINGS) ×3 IMPLANT
BLADE SURG 10 STRL SS (BLADE) ×9 IMPLANT
BLADE SURG 11 STRL SS (BLADE) ×3 IMPLANT
BNDG GAUZE ELAST 4 BULKY (GAUZE/BANDAGES/DRESSINGS) ×6 IMPLANT
CANISTER LIPO FAT HARVEST (MISCELLANEOUS) ×3 IMPLANT
CANISTER SUCT 1200ML W/VALVE (MISCELLANEOUS) ×3 IMPLANT
CHLORAPREP W/TINT 26 (MISCELLANEOUS) ×9 IMPLANT
COVER BACK TABLE 60X90IN (DRAPES) ×3 IMPLANT
COVER MAYO STAND STRL (DRAPES) ×6 IMPLANT
DERMABOND ADVANCED (GAUZE/BANDAGES/DRESSINGS) ×2
DERMABOND ADVANCED .7 DNX12 (GAUZE/BANDAGES/DRESSINGS) ×4 IMPLANT
DRAPE TOP ARMCOVERS (MISCELLANEOUS) ×3 IMPLANT
DRAPE U-SHAPE 76X120 STRL (DRAPES) ×3 IMPLANT
DRAPE UTILITY XL STRL (DRAPES) ×3 IMPLANT
DRSG PAD ABDOMINAL 8X10 ST (GAUZE/BANDAGES/DRESSINGS) ×12 IMPLANT
ELECT BLADE 4.0 EZ CLEAN MEGAD (MISCELLANEOUS) ×3
ELECT REM PT RETURN 9FT ADLT (ELECTROSURGICAL) ×3
ELECTRODE BLDE 4.0 EZ CLN MEGD (MISCELLANEOUS) ×2 IMPLANT
ELECTRODE REM PT RTRN 9FT ADLT (ELECTROSURGICAL) ×2 IMPLANT
GLOVE SURG HYDRASOFT LTX SZ5.5 (GLOVE) ×6 IMPLANT
GOWN STRL REUS W/ TWL LRG LVL3 (GOWN DISPOSABLE) ×4 IMPLANT
GOWN STRL REUS W/TWL LRG LVL3 (GOWN DISPOSABLE) ×6
IMPL BREAST P6.5XRND LO 650 (Breast) ×4 IMPLANT
IMPL BRST P6.5XRND LO 650CC (Breast) ×4 IMPLANT
IMPLANT BREAST GEL 650CC (Breast) ×6 IMPLANT
LINER CANISTER 1000CC FLEX (MISCELLANEOUS) ×3 IMPLANT
NDL SAFETY ECLIPSE 18X1.5 (NEEDLE) ×2 IMPLANT
NEEDLE FILTER BLUNT 18X 1/2SAF (NEEDLE) ×1
NEEDLE FILTER BLUNT 18X1 1/2 (NEEDLE) ×2 IMPLANT
NEEDLE HYPO 18GX1.5 SHARP (NEEDLE) ×3
NS IRRIG 1000ML POUR BTL (IV SOLUTION) ×6 IMPLANT
PACK BASIN DAY SURGERY FS (CUSTOM PROCEDURE TRAY) ×3 IMPLANT
PAD ALCOHOL SWAB (MISCELLANEOUS) ×3 IMPLANT
PENCIL SMOKE EVACUATOR (MISCELLANEOUS) ×3 IMPLANT
SHEET MEDIUM DRAPE 40X70 STRL (DRAPES) ×3 IMPLANT
SIZER BREAST 650CC SNGL USE (SIZER) ×3
SIZER BREAST 700CC SNGL USE (SIZER) ×3
SIZER BRST 650CC SNGL USE (SIZER) ×2 IMPLANT
SIZER BRST 700CC SNGL USE (SIZER) ×2 IMPLANT
SLEEVE SCD COMPRESS KNEE MED (STOCKING) ×3 IMPLANT
SPONGE T-LAP 18X18 ~~LOC~~+RFID (SPONGE) ×6 IMPLANT
STAPLER VISISTAT 35W (STAPLE) ×3 IMPLANT
SUT MNCRL AB 4-0 PS2 18 (SUTURE) ×3 IMPLANT
SUT VIC AB 3-0 SH 27 (SUTURE) ×6
SUT VIC AB 3-0 SH 27X BRD (SUTURE) ×4 IMPLANT
SUT VICRYL 4-0 PS2 18IN ABS (SUTURE) ×6 IMPLANT
SYR 10ML LL (SYRINGE) ×9 IMPLANT
SYR 50ML LL SCALE MARK (SYRINGE) ×12 IMPLANT
SYR BULB IRRIG 60ML STRL (SYRINGE) ×3 IMPLANT
SYR TOOMEY IRRIG 70ML (MISCELLANEOUS) ×3
SYRINGE TOOMEY IRRIG 70ML (MISCELLANEOUS) ×2 IMPLANT
TOWEL GREEN STERILE FF (TOWEL DISPOSABLE) ×6 IMPLANT
TUBE CONNECTING 20X1/4 (TUBING) ×3 IMPLANT
TUBING INFILTRATION IT-10001 (TUBING) ×3 IMPLANT
TUBING SET GRADUATE ASPIR 12FT (MISCELLANEOUS) ×3 IMPLANT
UNDERPAD 30X36 HEAVY ABSORB (UNDERPADS AND DIAPERS) ×6 IMPLANT
YANKAUER SUCT BULB TIP NO VENT (SUCTIONS) ×3 IMPLANT

## 2021-01-15 NOTE — Discharge Instructions (Addendum)
  Post Anesthesia Home Care Instructions  Activity: Get plenty of rest for the remainder of the day. A responsible individual must stay with you for 24 hours following the procedure.  For the next 24 hours, DO NOT: -Drive a car -Paediatric nurse -Drink alcoholic beverages -Take any medication unless instructed by your physician -Make any legal decisions or sign important papers.  Meals: Start with liquid foods such as gelatin or soup. Progress to regular foods as tolerated. Avoid greasy, spicy, heavy foods. If nausea and/or vomiting occur, drink only clear liquids until the nausea and/or vomiting subsides. Call your physician if vomiting continues.  Special Instructions/Symptoms: Your throat may feel dry or sore from the anesthesia or the breathing tube placed in your throat during surgery. If this causes discomfort, gargle with warm salt water. The discomfort should disappear within 24 hours.  If you had a scopolamine patch placed behind your ear for the management of post- operative nausea and/or vomiting:  1. The medication in the patch is effective for 72 hours, after which it should be removed.  Wrap patch in a tissue and discard in the trash. Wash hands thoroughly with soap and water. 2. You may remove the patch earlier than 72 hours if you experience unpleasant side effects which may include dry mouth, dizziness or visual disturbances. 3. Avoid touching the patch. Wash your hands with soap and water after contact with the patch.        Next dose of tylenol can be given after 12:41 PM. Next dose of NSAID (ibuprofen, Motrin, Aleve) can be given after 12:41 PM.

## 2021-01-15 NOTE — Anesthesia Procedure Notes (Signed)
Procedure Name: LMA Insertion Date/Time: 01/15/2021 7:42 AM Performed by: Tawni Millers, CRNA Pre-anesthesia Checklist: Patient identified, Emergency Drugs available, Suction available and Patient being monitored Patient Re-evaluated:Patient Re-evaluated prior to induction Oxygen Delivery Method: Circle system utilized Preoxygenation: Pre-oxygenation with 100% oxygen Induction Type: IV induction Ventilation: Mask ventilation without difficulty LMA: LMA inserted LMA Size: 3.0 Number of attempts: 1 Airway Equipment and Method: Bite block Placement Confirmation: positive ETCO2 Tube secured with: Tape Dental Injury: Teeth and Oropharynx as per pre-operative assessment

## 2021-01-15 NOTE — Op Note (Signed)
Operative Note   DATE OF OPERATION: 11.4.22  LOCATION: [facility]-outpatient  SURGICAL DIVISION: Plastic Surgery  PREOPERATIVE DIAGNOSES:  1. History breast cancer 2. Acquired absence breasts  POSTOPERATIVE DIAGNOSES:  same  PROCEDURE:  1. Removal bilateral chest tissue expanders and placement silicone implants 2. Lipofilling to bilateral chest total 100 ml  SURGEON: Irene Limbo MD MBA  ASSISTANT: none  ANESTHESIA:  General.   EBL: 20 ml  COMPLICATIONS: None immediate.   INDICATIONS FOR PROCEDURE:  The patient, Amanda Burns, is a 40 y.o. female born on [birth year], is here for staged breast reconstruction following skin reduction mastectomies with immediate prepectoral tissue expander based reconstruction.   FINDINGS: Complete incorporation ADM noted bilateral. 50 ml fat infiltrated over right chest, 50 ml fat infiltrated over left chest. Natrelle Soft Touch Extra Projection Smooth Round 650 ml implants placed bilateral REF [id number] RIGHT [id number]  DESCRIPTION OF PROCEDURE:  The patient's operative site was marked including supra and infra umbilical abdomen, bilateral lateral chest wall for liposuction. The patient was taken to the operating room. SCDs were placed and IV antibiotics were given. The patient's operative site was prepped and draped in a sterile fashion. A time out was performed and all information was confirmed to be correct. I began on left side. Incision made through prior inframammary fold scar and carried through superficial fascia and acellular dermis. Expander removed. Superior and medial capsulotomies performed. Sizer placed.   I then directed attention to right chest. Incision made in prior inframammary fold scar and carried through superficial fascia and acellular dermis. Expander removed and well incorporated ADM noted. Superior and medial capsulotomies performed. Sizer placed. Natrelle Smooth Round Extra Projection 650 ml implant  selected for each chest.    Stab incision made over bilateral abdomen and lateral chest wall. Tumescent fluid infiltrated over supra and infra umbilical abdomen and bilateral lateral chest wall, total 500 ml tumescent infiltrated. Power assisted liposuction performed to endpoint symmetric contour and soft tissue thickness, total lipoaspirate 400 ml. The fat was then washed and prepared by gravity for infiltration. Harvested fat was then infiltrated in subcutaneous plane throughout total envelope bilateral mastectomy flaps.    Each cavity irrigated with saline solution containing Betadine, Ancef, gentamicin solution. Hemostasis ensured.The implant was placed in right chest and implant orientation ensured. Closure completed with 3-0 vicryl to close superficial fascia and capsule over implant. 4-0 vicryl used to close dermis followed by 4-0 monocryl subcuticular. Implant placed in left chest cavity. Closure completed with 3-0 vicryl to close superficial fascia and capsule over implant. 4-0 vicryl used to close dermis followed by 4-0 monocryl subcuticular skin closure. Abdomen and lateral chest wall incisions approximated with simple 4-0 monocryl stitch. Dermabond applied to chest incisions. Dry dressing applied, followed by breast binder and abdominal binder.  The patient was allowed to wake from anesthesia, extubated and taken to the recovery room in satisfactory condition.   SPECIMENS: none  DRAINS: none

## 2021-01-15 NOTE — Interval H&P Note (Signed)
History and Physical Interval Note:  01/15/2021 6:51 AM  Amanda Burns  has presented today for surgery, with the diagnosis of history breast ca, acquired absence breasts.  The various methods of treatment have been discussed with the patient and family. After consideration of risks, benefits and other options for treatment, the patient has consented to  Procedure(s): REMOVAL OF BILATERAL TISSUE EXPANDERS WITH PLACEMENT OF BILATERAL BREAST SILICONE IMPLANTS (Bilateral) LIPOSUCTION BILATERAL CHEST WALL, LIPOFILLING TO BILATERAL CHEST (Bilateral) as a surgical intervention.  The patient's history has been reviewed, patient examined, no change in status, stable for surgery.  I have reviewed the patient's chart and labs.  Questions were answered to the patient's satisfaction.     Arnoldo Hooker Arthelia Callicott

## 2021-01-15 NOTE — Transfer of Care (Signed)
Immediate Anesthesia Transfer of Care Note  Patient: Amanda Burns  Procedure(s) Performed: REMOVAL OF BILATERAL TISSUE EXPANDERS WITH PLACEMENT OF BILATERAL BREAST SILICONE IMPLANTS (Bilateral: Breast) LIPOSUCTION BILATERAL CHEST WALL, LIPOFILLING TO BILATERAL CHEST (Bilateral: Chest)  Patient Location: PACU  Anesthesia Type:General  Level of Consciousness: awake  Airway & Oxygen Therapy: Patient Spontanous Breathing and Patient connected to face mask oxygen  Post-op Assessment: Report given to RN and Post -op Vital signs reviewed and stable  Post vital signs: Reviewed and stable  Last Vitals:  Vitals Value Taken Time  BP    Temp    Pulse 119 01/15/21 0953  Resp 16 01/15/21 0953  SpO2 99 % 01/15/21 0953  Vitals shown include unvalidated device data.  Last Pain:  Vitals:   01/15/21 0638  TempSrc: Oral  PainSc: 0-No pain      Patients Stated Pain Goal: 4 (85/88/50 2774)  Complications: No notable events documented.

## 2021-01-15 NOTE — Anesthesia Preprocedure Evaluation (Addendum)
Anesthesia Evaluation  Patient identified by MRN, date of birth, ID band Patient awake    Reviewed: Allergy & Precautions, NPO status , Patient's Chart, lab work & pertinent test results  Airway Mallampati: II  TM Distance: >3 FB     Dental   Pulmonary    breath sounds clear to auscultation       Cardiovascular negative cardio ROS   Rhythm:Regular Rate:Normal     Neuro/Psych    GI/Hepatic Neg liver ROS, GERD  ,  Endo/Other    Renal/GU negative Renal ROS     Musculoskeletal   Abdominal   Peds  Hematology   Anesthesia Other Findings   Reproductive/Obstetrics                             Anesthesia Physical Anesthesia Plan  ASA: 2  Anesthesia Plan: General   Post-op Pain Management:    Induction: Intravenous  PONV Risk Score and Plan: 4 or greater and Ondansetron, Dexamethasone and Midazolam  Airway Management Planned: LMA  Additional Equipment:   Intra-op Plan:   Post-operative Plan: Extubation in OR  Informed Consent: I have reviewed the patients History and Physical, chart, labs and discussed the procedure including the risks, benefits and alternatives for the proposed anesthesia with the patient or authorized representative who has indicated his/her understanding and acceptance.     Dental advisory given  Plan Discussed with: CRNA and Anesthesiologist  Anesthesia Plan Comments:       Anesthesia Quick Evaluation

## 2021-01-15 NOTE — Anesthesia Postprocedure Evaluation (Signed)
Anesthesia Post Note  Patient: Amanda Burns  Procedure(s) Performed: REMOVAL OF BILATERAL TISSUE EXPANDERS WITH PLACEMENT OF BILATERAL BREAST SILICONE IMPLANTS (Bilateral: Breast) LIPOSUCTION BILATERAL CHEST WALL, LIPOFILLING TO BILATERAL CHEST (Bilateral: Chest)     Patient location during evaluation: PACU Anesthesia Type: General Level of consciousness: awake Pain management: pain level controlled Vital Signs Assessment: post-procedure vital signs reviewed and stable Respiratory status: spontaneous breathing Cardiovascular status: stable Postop Assessment: no apparent nausea or vomiting Anesthetic complications: no   No notable events documented.  Last Vitals:  Vitals:   01/15/21 1003 01/15/21 1015  BP:  119/83  Pulse: (!) 115 (!) 120  Resp: 17 16  Temp:    SpO2: 100% 100%    Last Pain:  Vitals:   01/15/21 1021  TempSrc:   PainSc: 6                  Amanda Burns

## 2021-01-18 ENCOUNTER — Encounter (HOSPITAL_BASED_OUTPATIENT_CLINIC_OR_DEPARTMENT_OTHER): Payer: Self-pay | Admitting: Plastic Surgery

## 2021-01-20 NOTE — Progress Notes (Incomplete)
Patient Care Team: Montez Hageman, DO as PCP - General (Family Medicine) Pershing Proud, RN as Oncology Nurse Navigator Donnelly Angelica, RN as Oncology Nurse Navigator Almond Lint, MD as Consulting Physician (General Surgery) Serena Croissant, MD as Consulting Physician (Hematology and Oncology) Antony Blackbird, MD as Consulting Physician (Radiation Oncology)  DIAGNOSIS: No diagnosis found.  SUMMARY OF ONCOLOGIC HISTORY: Oncology History  Malignant neoplasm of upper-outer quadrant of left breast in female, estrogen receptor negative (HCC)  11/29/2019 Initial Diagnosis   Patient palpated an area of concern in the left breast. Diagnostic mammogram and US showed a 1.8cm mass in the upper outer right breast representing a fibroadenoma, and in the left breast, three adjacent masses at the 2-2:30 position, 1.7cm, 0.8cm, and 1.4cm, with several cysts and no axillary adenopathy. Biopsy showed a benign fibroadenoma in the right breast, and in the left breast, IDC at the 2:30 position, grade 3, HER-2 negative (1+), ER+ 0%, PR+ 0%, Ki67 80%.    12/04/2019 Cancer Staging   Staging form: Breast, AJCC 8th Edition - Clinical: Stage IB (cT1c, cN0, cM0, G3, ER-, PR-, HER2-) - Signed by Serena Croissant, MD on 12/04/2019    12/21/2019 Genetic Testing   Negative genetic testing:  No pathogenic variants detected on the [facility] Multi-Cancer Panel. A variant of uncertain significance (VUS) was detected in the CDK4 gene called c.415C>T (p.Arg139*). The report date is 12/21/2019.  The Multi-Cancer Panel offered by [facility] includes sequencing and/or deletion duplication testing of the following 85 genes: AIP, ALK, APC, ATM, AXIN2,BAP1,  BARD1, BLM, BMPR1A, BRCA1, BRCA2, BRIP1, CASR, CDC73, CDH1, CDK4, CDKN1B, CDKN1C, CDKN2A (p14ARF), CDKN2A (p16INK4a), CEBPA, CHEK2, CTNNA1, DICER1, DIS3L2, EGFR (c.2369C>T, p.Thr790Met variant only), EPCAM (Deletion/duplication testing only), FH, FLCN, GATA2, GPC3, GREM1 (Promoter region  deletion/duplication testing only), HOXB13 (c.251G>A, p.Gly84Glu), HRAS, KIT, MAX, MEN1, MET, MITF (c.952G>A, p.Glu318Lys variant only), MLH1, MSH2, MSH3, MSH6, MUTYH, NBN, NF1, NF2, NTHL1, PALB2, PDGFRA, PHOX2B, PMS2, POLD1, POLE, POT1, PRKAR1A, PTCH1, PTEN, RAD50, RAD51C, RAD51D, RB1, RECQL4, RET, RNF43, RUNX1, SDHAF2, SDHA (sequence changes only), SDHB, SDHC, SDHD, SMAD4, SMARCA4, SMARCB1, SMARCE1, STK11, SUFU, TERC, TERT, TMEM127, TP53, TSC1, TSC2, VHL, WRN and WT1.    01/09/2020 Surgery   Bilateral mastectomies with reconstruction Donell Beers & Thimmappa):  Right breast: no evidence of malignancy Left breast: multifocal invasive ductal carcinoma, grade 3, largest 2.2cm, with high grade DCIS, clear margins, 4 left axillary lymph nodes negative for carcinoma.   03/18/2020 - 08/28/2020 Chemotherapy   Adriamycin, Cytoxan Keytruda followed by Dorice Lamas Harrell Gave   09/17/2020 -  Chemotherapy   Patient is on Treatment Plan : BREAST Pembrolizumab q21d       CHIEF COMPLIANT: Cycle 6 Keytruda  INTERVAL HISTORY: Amanda Burns is a 40 y.o. with above-mentioned history of breast cancer who underwent bilateral mastectomies with reconstruction. She presents to the clinic for cycle 6 of Keytruda.   ALLERGIES:  is allergic to benzoyl peroxide and hydrocodone.  MEDICATIONS:  Current Outpatient Medications  Medication Sig Dispense Refill   acetaminophen (TYLENOL) 500 MG tablet Take 1,000 mg by mouth every 6 (six) hours as needed for moderate pain or headache.     ALPRAZolam (XANAX) 0.5 MG tablet Take 1 tablet (0.5 mg total) by mouth 3 (three) times daily as needed for anxiety. 60 tablet 1   amphetamine-dextroamphetamine (ADDERALL XR) 20 MG 24 hr capsule Take 20 mg by mouth daily.      BIOTIN PO Take by mouth.     bismuth subsalicylate (PEPTO BISMOL) 262  MG/15ML suspension Take 30 mLs by mouth every 6 (six) hours as needed for indigestion or diarrhea or loose stools.     buPROPion (WELLBUTRIN XL) 150 MG 24 hr  tablet Take 150 mg by mouth daily.     escitalopram (LEXAPRO) 20 MG tablet Take 20 mg by mouth daily.     fluticasone (FLONASE) 50 MCG/ACT nasal spray Place 1 spray into both nostrils daily as needed for allergies or rhinitis.     ibuprofen (ADVIL) 200 MG tablet Take 400 mg by mouth every 6 (six) hours as needed for headache or moderate pain.     magnesium oxide (MAG-OX) 400 (240 Mg) MG tablet Take 1 tablet (400 mg total) by mouth daily. 30 tablet 6   omeprazole (PRILOSEC) 20 MG capsule Take 40 mg by mouth daily.     spironolactone (ALDACTONE) 50 MG tablet Take 50 mg by mouth daily.     No current facility-administered medications for this visit.   Facility-Administered Medications Ordered in Other Visits  Medication Dose Route Frequency Provider Last Rate Last Admin   sodium chloride flush (NS) 0.9 % injection 10 mL  10 mL Intravenous PRN [name], MD   10 mL at 03/18/20 1033    PHYSICAL EXAMINATION: ECOG PERFORMANCE STATUS: {CHL ONC ECOG PS:[id number]}  There were no vitals filed for this visit. There were no vitals filed for this visit.  LABORATORY DATA:  I have reviewed the data as listed CMP Latest Ref Rng & Units 12/10/2020 11/19/2020 10/29/2020  Glucose 70 - 99 mg/dL 122(H) 106(H) 101(H)  BUN 6 - 20 mg/dL $Remove'10 11 9  'RwsVZpf$ Creatinine 0.44 - 1.00 mg/dL 0.82 0.82 0.79  Sodium 135 - 145 mmol/L 139 140 139  Potassium 3.5 - 5.1 mmol/L 3.9 4.0 4.0  Chloride 98 - 111 mmol/L 103 103 103  CO2 22 - 32 mmol/L $RemoveB'25 26 26  'YPsdTBCP$ Calcium 8.9 - 10.3 mg/dL 9.5 9.9 9.5  Total Protein 6.5 - 8.1 g/dL 7.3 7.7 7.4  Total Bilirubin 0.3 - 1.2 mg/dL 0.2(L) 0.3 0.2(L)  Alkaline Phos 38 - 126 U/L 120 127(H) 127(H)  AST 15 - 41 U/L 23 25 33  ALT 0 - 44 U/L 34 43 59(H)    Lab Results  Component Value Date   WBC 4.5 12/10/2020   HGB 10.4 (L) 12/10/2020   HCT 33.3 (L) 12/10/2020   MCV 78.4 (L) 12/10/2020   PLT 301 12/10/2020   NEUTROABS 3.0 12/10/2020    ASSESSMENT & PLAN:  No problem-specific Assessment  & Plan notes found for this encounter.    No orders of the defined types were placed in this encounter.  The patient has a good understanding of the overall plan. she agrees with it. she will call with any problems that may develop before the next visit here.  Total time spent: *** mins including face to face time and time spent for planning, charting and coordination of care  Rulon Eisenmenger, MD, MPH 01/20/2021  I, Thana Ates, am acting as scribe for Dr. [name].  {insert scribe attestation}

## 2021-01-22 ENCOUNTER — Inpatient Hospital Stay: Payer: BC Managed Care – PPO | Admitting: Hematology and Oncology

## 2021-01-22 ENCOUNTER — Encounter: Payer: Self-pay | Admitting: Hematology and Oncology

## 2021-01-22 ENCOUNTER — Inpatient Hospital Stay: Payer: BC Managed Care – PPO

## 2021-01-22 NOTE — Assessment & Plan Note (Deleted)
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 Biopsy revealed IDC grade 3, ER 0%, PR 0%, Ki-67 80%, HER-2 -1+ T1c M0 stage IB  Treatment plan: 1.Bilateral mastectomies 01/09/2020: Right mastectomy:PASH Left mastectomy: Multifocal IDC grade 3 2.2 cm largest, high-grade DCIS, margins negative, 0/4 sentinel lymph nodes, ER negative, PR negative, HER-2 negative, Ki-67 80% 2.I recommended adjuvant chemotherapy with dose dense Adriamycin andCytoxanpembrolizumabfollowed by Taxol, carboplatin and pembrolizumabcompleted 08/28/2020 3.Maintenance Pembrolizumab ----------------------------------------------------------------------------------------------------------------------------------- Current treatment:Keytruda every three weeks(she will finish by end of December 2022) Labs reviewed  Chemo toxicities: 1.Peripheral neuropathy:resolved 2. Liver enzyme elevation:Monitoring labs closely 3.Diffuse body aches and pains: Probably from Organ.on magnesium 4.Profound fatigue:TSH 0.9 on 10/29/2020. No immune mediated adverse effects.  Abdominal pain and muscle and joint aches and pains: 11/30/2020: Liver: 2.1 cm enhancement nonspecific, no definite metastatic disease.  Left iliac bone lytic and sclerotic lesion: Nonspecific, multiple thyroid nodules. 12/18/2020: Bone scan: No abnormal activity in the small sclerotic foci in the left pelvis  Because her insurance is no longer covering Pembrolizumab, I recommended discontinuation of the therapy at this time.  With this she concludes her treatment and therefore the port will also be removed.  Return to clinic in 3 months for survivorship care plan visit

## 2021-01-22 NOTE — Addendum Note (Signed)
Addended by: Tora Kindred on: 01/22/2021 10:37 AM   Modules accepted: Orders

## 2021-01-25 NOTE — Progress Notes (Signed)
..  Patient is receiving Assistance Medication - Supplied Externally. Medication: Keytruda (pembrolizumab) Manufacture: [employer] Approval Dates: Approved from 01/25/2021 until 01/25/2022. ID: [id number] Reason: Insurance Denial  First DOS: 01/29/2021  .Marland KitchenJuan Quam, CPhT IV Drug Replacement Specialist [place] Phone: [phone]

## 2021-01-28 NOTE — Progress Notes (Signed)
Patient Care Team: Gara Kroner, DO as PCP - General (Family Medicine) [name], RN as Oncology Nurse Navigator [name], RN as Oncology Nurse Navigator [name], MD as Consulting Physician (General Surgery) [name], MD as Consulting Physician (Hematology and Oncology) [name], MD as Consulting Physician (Radiation Oncology)  DIAGNOSIS:    ICD-10-CM   1. Malignant neoplasm of upper-outer quadrant of left breast in female, estrogen receptor negative ([place])  C50.412 Ferritin   Z17.1 Iron and TIBC      SUMMARY OF ONCOLOGIC HISTORY: Oncology History  Malignant neoplasm of upper-outer quadrant of left breast in female, estrogen receptor negative ([place])  11/29/2019 Initial Diagnosis   Patient palpated an area of concern in the left breast. Diagnostic mammogram and US showed a 1.8cm mass in the upper outer right breast representing a fibroadenoma, and in the left breast, three adjacent masses at the 2-2:30 position, 1.7cm, 0.8cm, and 1.4cm, with several cysts and no axillary adenopathy. Biopsy showed a benign fibroadenoma in the right breast, and in the left breast, IDC at the 2:30 position, grade 3, HER-2 negative (1+), ER+ 0%, PR+ 0%, Ki67 80%.    12/04/2019 Cancer Staging   Staging form: Breast, AJCC 8th Edition - Clinical: Stage IB (cT1c, cN0, cM0, G3, ER-, PR-, HER2-) - Signed by [name], MD on 12/04/2019    12/21/2019 Genetic Testing   Negative genetic testing:  No pathogenic variants detected on the [facility] Multi-Cancer Panel. A variant of uncertain significance (VUS) was detected in the CDK4 gene called c.415C>T (p.Arg139*). The report date is 12/21/2019.  The Multi-Cancer Panel offered by [facility] includes sequencing and/or deletion duplication testing of the following 85 genes: AIP, ALK, APC, ATM, AXIN2,BAP1,  BARD1, BLM, BMPR1A, BRCA1, BRCA2, BRIP1, CASR, CDC73, CDH1, CDK4, CDKN1B, CDKN1C, CDKN2A (p14ARF), CDKN2A (p16INK4a), CEBPA, CHEK2, CTNNA1,  DICER1, DIS3L2, EGFR (c.2369C>T, p.Thr790Met variant only), EPCAM (Deletion/duplication testing only), FH, FLCN, GATA2, GPC3, GREM1 (Promoter region deletion/duplication testing only), HOXB13 (c.251G>A, p.Gly84Glu), HRAS, KIT, MAX, MEN1, MET, MITF (c.952G>A, p.Glu318Lys variant only), MLH1, MSH2, MSH3, MSH6, MUTYH, NBN, NF1, NF2, NTHL1, PALB2, PDGFRA, PHOX2B, PMS2, POLD1, POLE, POT1, PRKAR1A, PTCH1, PTEN, RAD50, RAD51C, RAD51D, RB1, RECQL4, RET, RNF43, RUNX1, SDHAF2, SDHA (sequence changes only), SDHB, SDHC, SDHD, SMAD4, SMARCA4, SMARCB1, SMARCE1, STK11, SUFU, TERC, TERT, TMEM127, TP53, TSC1, TSC2, VHL, WRN and WT1.    01/09/2020 Surgery   Bilateral mastectomies with reconstruction [facility]):  Right breast: no evidence of malignancy Left breast: multifocal invasive ductal carcinoma, grade 3, largest 2.2cm, with high grade DCIS, clear margins, 4 left axillary lymph nodes negative for carcinoma.   03/18/2020 - 08/28/2020 Chemotherapy   Adriamycin, Cytoxan Keytruda followed by Raiford Noble [facility]   09/17/2020 -  Chemotherapy   Patient is on Treatment Plan : BREAST Pembrolizumab q21d       CHIEF COMPLIANT: Cycle 6 Keytruda  INTERVAL HISTORY: Amanda Burns is a 40 y.o. with above-mentioned history of cancer who underwent bilateral mastectomies with reconstruction. She presents to the clinic for cycle 6 of Keytruda.  She is extremely relieved that her scans had come back normal.  She has a liver MRI coming up as well.  ALLERGIES:  is allergic to benzoyl peroxide and hydrocodone.  MEDICATIONS:  Current Outpatient Medications  Medication Sig Dispense Refill   acetaminophen (TYLENOL) 500 MG tablet Take 1,000 mg by mouth every 6 (six) hours as needed for moderate pain or headache.     ALPRAZolam (XANAX) 0.5 MG tablet Take 1 tablet (  0.5 mg total) by mouth 3 (three) times daily as needed for anxiety. 60 tablet 1   amphetamine-dextroamphetamine (ADDERALL XR) 20 MG 24 hr capsule Take 20 mg by  mouth daily.      BIOTIN PO Take by mouth.     bismuth subsalicylate (PEPTO BISMOL) 262 MG/15ML suspension Take 30 mLs by mouth every 6 (six) hours as needed for indigestion or diarrhea or loose stools.     buPROPion (WELLBUTRIN XL) 150 MG 24 hr tablet Take 150 mg by mouth daily.     escitalopram (LEXAPRO) 20 MG tablet Take 20 mg by mouth daily.     fluticasone (FLONASE) 50 MCG/ACT nasal spray Place 1 spray into both nostrils daily as needed for allergies or rhinitis.     ibuprofen (ADVIL) 200 MG tablet Take 400 mg by mouth every 6 (six) hours as needed for headache or moderate pain.     magnesium oxide (MAG-OX) 400 (240 Mg) MG tablet Take 1 tablet (400 mg total) by mouth daily. 30 tablet 6   omeprazole (PRILOSEC) 20 MG capsule Take 40 mg by mouth daily.     spironolactone (ALDACTONE) 50 MG tablet Take 50 mg by mouth daily.     No current facility-administered medications for this visit.   Facility-Administered Medications Ordered in Other Visits  Medication Dose Route Frequency Provider Last Rate Last Admin   heparin lock flush 100 unit/mL  500 Units Intracatheter Once PRN [name], MD       pembrolizumab [facility]) 200 mg in sodium chloride 0.9 % 50 mL chemo infusion  200 mg Intravenous Once [name], MD 116 mL/hr at 01/29/21 1303 200 mg at 01/29/21 1303   sodium chloride flush (NS) 0.9 % injection 10 mL  10 mL Intravenous PRN [name], MD   10 mL at 03/18/20 1033   sodium chloride flush (NS) 0.9 % injection 10 mL  10 mL Intracatheter PRN [name], MD        PHYSICAL EXAMINATION: ECOG PERFORMANCE STATUS: 1 - Symptomatic but completely ambulatory  Vitals:   01/29/21 1203  BP: 137/90  Pulse: 99  Resp: 16  Temp: 98.2 F (36.8 C)  SpO2: 100%   Filed Weights   01/29/21 1203  Weight: 173 lb 4.8 oz (78.6 kg)    LABORATORY DATA:  I have reviewed the data as listed CMP Latest Ref Rng & Units 01/29/2021 12/10/2020 11/19/2020  Glucose 70 - 99 mg/dL 114(H) 122(H)  106(H)  BUN 6 - 20 mg/dL $Remove'13 10 11  'XjycjqN$ Creatinine 0.44 - 1.00 mg/dL 0.82 0.82 0.82  Sodium 135 - 145 mmol/L 138 139 140  Potassium 3.5 - 5.1 mmol/L 3.9 3.9 4.0  Chloride 98 - 111 mmol/L 103 103 103  CO2 22 - 32 mmol/L $RemoveB'25 25 26  'yWjCmlbH$ Calcium 8.9 - 10.3 mg/dL 9.4 9.5 9.9  Total Protein 6.5 - 8.1 g/dL 7.9 7.3 7.7  Total Bilirubin 0.3 - 1.2 mg/dL <0.2(L) 0.2(L) 0.3  Alkaline Phos 38 - 126 U/L 172(H) 120 127(H)  AST 15 - 41 U/L $Remo'21 23 25  'JLAQC$ ALT 0 - 44 U/L 32 34 43    Lab Results  Component Value Date   WBC 8.2 01/29/2021   HGB 10.5 (L) 01/29/2021   HCT 33.1 (L) 01/29/2021   MCV 73.9 (L) 01/29/2021   PLT 397 01/29/2021   NEUTROABS 5.9 01/29/2021    ASSESSMENT & PLAN:  Malignant neoplasm of upper-outer quadrant of left breast in female, estrogen receptor negative ([place]) Palpable left breast mass:  1.8 cm UOQ fibroadenoma in the right breast, left breast 3 masses 2 to 2:30 position: 1.7, 1.8, 1.4 cm.  Biopsy revealed IDC grade 3, ER 0%, PR 0%, Ki-67 80%, HER-2 -1+ T1c M0 stage IB   Treatment plan: 1.    Bilateral mastectomies 01/09/2020: Right mastectomy:PASH Left mastectomy: Multifocal IDC grade 3 2.2 cm largest, high-grade DCIS, margins negative, 0/4 sentinel lymph nodes, ER negative, PR negative, HER-2 negative, Ki-67 80% 2.  I recommended adjuvant chemotherapy with dose dense Adriamycin and Cytoxan pembrolizumab followed by Taxol, carboplatin and pembrolizumab completed 08/28/2020 3.  Maintenance Pembrolizumab ----------------------------------------------------------------------------------------------------------------------------------- Current treatment: Keytruda every three weeks (she will finish by end of December 2022) Labs reviewed   Chemo toxicities: 1.  Peripheral neuropathy: resolved 2. Liver enzyme elevation: Monitoring labs closely  3.  Diffuse body aches and pains: Probably from [place].  I sent a prescription for magnesium today. 4.  Profound fatigue: TSH 0.9 on  10/29/2020. 5.  Microcytic anemia: We will obtain iron studies today and determine if she needs oral iron or IV iron. No immune mediated adverse effects.   Abdominal pain and muscle and joint aches and pains: 11/30/2020: Liver: 2.1 cm enhancement nonspecific, no definite metastatic disease.  Left iliac bone lytic and sclerotic lesion: Nonspecific, multiple thyroid nodules. Bone lesions: 12/18/20 bone scan: No abnormal activity to correspond to the small sclerotic foci in the left pelvis seen last month We will get a liver MRI for further evaluation (after she undergoes implant replacement) this will be at the end of November.    Return to clinic every 3 weeks for Keytruda      Orders Placed This Encounter  Procedures   Ferritin    Standing Status:   Future    Number of Occurrences:   1    Standing Expiration Date:   01/29/2022   Iron and TIBC    Standing Status:   Future    Number of Occurrences:   1    Standing Expiration Date:   01/29/2022   The patient has a good understanding of the overall plan. she agrees with it. she will call with any problems that may develop before the next visit here.  Total time spent: 30 mins including face to face time and time spent for planning, charting and coordination of care  Rulon Eisenmenger, MD, MPH 01/29/2021  I, Thana Ates, am acting as scribe for Dr. [name].  I have reviewed the above documentation for accuracy and completeness, and I agree with the above.

## 2021-01-29 ENCOUNTER — Inpatient Hospital Stay: Payer: BC Managed Care – PPO

## 2021-01-29 ENCOUNTER — Other Ambulatory Visit: Payer: Self-pay

## 2021-01-29 ENCOUNTER — Inpatient Hospital Stay: Payer: BC Managed Care – PPO | Attending: Hematology and Oncology | Admitting: Hematology and Oncology

## 2021-01-29 DIAGNOSIS — C50412 Malignant neoplasm of upper-outer quadrant of left female breast: Secondary | ICD-10-CM | POA: Insufficient documentation

## 2021-01-29 DIAGNOSIS — Z79899 Other long term (current) drug therapy: Secondary | ICD-10-CM | POA: Insufficient documentation

## 2021-01-29 DIAGNOSIS — D649 Anemia, unspecified: Secondary | ICD-10-CM | POA: Diagnosis not present

## 2021-01-29 DIAGNOSIS — E039 Hypothyroidism, unspecified: Secondary | ICD-10-CM | POA: Diagnosis not present

## 2021-01-29 DIAGNOSIS — Z171 Estrogen receptor negative status [ER-]: Secondary | ICD-10-CM

## 2021-01-29 DIAGNOSIS — D241 Benign neoplasm of right breast: Secondary | ICD-10-CM | POA: Insufficient documentation

## 2021-01-29 DIAGNOSIS — Z5112 Encounter for antineoplastic immunotherapy: Secondary | ICD-10-CM | POA: Diagnosis present

## 2021-01-29 DIAGNOSIS — Z95828 Presence of other vascular implants and grafts: Secondary | ICD-10-CM

## 2021-01-29 DIAGNOSIS — Z803 Family history of malignant neoplasm of breast: Secondary | ICD-10-CM | POA: Diagnosis not present

## 2021-01-29 DIAGNOSIS — Z9013 Acquired absence of bilateral breasts and nipples: Secondary | ICD-10-CM | POA: Diagnosis not present

## 2021-01-29 DIAGNOSIS — R7401 Elevation of levels of liver transaminase levels: Secondary | ICD-10-CM | POA: Diagnosis not present

## 2021-01-29 LAB — FERRITIN: Ferritin: 21 ng/mL (ref 11–307)

## 2021-01-29 LAB — TSH: TSH: 1.252 u[IU]/mL (ref 0.308–3.960)

## 2021-01-29 LAB — CMP (CANCER CENTER ONLY)
ALT: 32 U/L (ref 0–44)
AST: 21 U/L (ref 15–41)
Albumin: 4.1 g/dL (ref 3.5–5.0)
Alkaline Phosphatase: 172 U/L — ABNORMAL HIGH (ref 38–126)
Anion gap: 10 (ref 5–15)
BUN: 13 mg/dL (ref 6–20)
CO2: 25 mmol/L (ref 22–32)
Calcium: 9.4 mg/dL (ref 8.9–10.3)
Chloride: 103 mmol/L (ref 98–111)
Creatinine: 0.82 mg/dL (ref 0.44–1.00)
GFR, Estimated: >60 mL/min (ref >60)
Glucose, Bld: 114 mg/dL — ABNORMAL HIGH (ref 70–99)
Potassium: 3.9 mmol/L (ref 3.5–5.1)
Sodium: 138 mmol/L (ref 135–145)
Total Bilirubin: <0.2 mg/dL — ABNORMAL LOW (ref 0.3–1.2)
Total Protein: 7.9 g/dL (ref 6.5–8.1)

## 2021-01-29 LAB — CBC WITH DIFFERENTIAL (CANCER CENTER ONLY)
Abs Immature Granulocytes: 0.02 10*3/uL (ref 0.00–0.07)
Basophils Absolute: 0 10*3/uL (ref 0.0–0.1)
Basophils Relative: 1 %
Eosinophils Absolute: 0.2 10*3/uL (ref 0.0–0.5)
Eosinophils Relative: 2 %
HCT: 33.1 % — ABNORMAL LOW (ref 36.0–46.0)
Hemoglobin: 10.5 g/dL — ABNORMAL LOW (ref 12.0–15.0)
Immature Granulocytes: 0 %
Lymphocytes Relative: 19 %
Lymphs Abs: 1.6 10*3/uL (ref 0.7–4.0)
MCH: 23.4 pg — ABNORMAL LOW (ref 26.0–34.0)
MCHC: 31.7 g/dL (ref 30.0–36.0)
MCV: 73.9 fL — ABNORMAL LOW (ref 80.0–100.0)
Monocytes Absolute: 0.5 10*3/uL (ref 0.1–1.0)
Monocytes Relative: 6 %
Neutro Abs: 5.9 10*3/uL (ref 1.7–7.7)
Neutrophils Relative %: 72 %
Platelet Count: 397 10*3/uL (ref 150–400)
RBC: 4.48 MIL/uL (ref 3.87–5.11)
RDW: 16.1 % — ABNORMAL HIGH (ref 11.5–15.5)
WBC Count: 8.2 10*3/uL (ref 4.0–10.5)
nRBC: 0 % (ref 0.0–0.2)

## 2021-01-29 LAB — IRON AND TIBC
Iron: 33 ug/dL — ABNORMAL LOW (ref 41–142)
Saturation Ratios: 7 % — ABNORMAL LOW (ref 21–57)
TIBC: 449 ug/dL — ABNORMAL HIGH (ref 236–444)
UIBC: 416 ug/dL — ABNORMAL HIGH (ref 120–384)

## 2021-01-29 LAB — T4, FREE: Free T4: 1.03 ng/dL (ref 0.61–1.12)

## 2021-01-29 MED ORDER — HEPARIN SOD (PORK) LOCK FLUSH 100 UNIT/ML IV SOLN
500.0000 [IU] | Freq: Once | INTRAVENOUS | Status: AC | PRN
  Administered 2021-01-29: 500 [IU]

## 2021-01-29 MED ORDER — SODIUM CHLORIDE 0.9% FLUSH
10.0000 mL | Freq: Once | INTRAVENOUS | Status: AC
  Administered 2021-01-29: 10 mL

## 2021-01-29 MED ORDER — SODIUM CHLORIDE 0.9% FLUSH
10.0000 mL | INTRAVENOUS | Status: DC | PRN
  Administered 2021-01-29: 10 mL

## 2021-01-29 MED ORDER — SODIUM CHLORIDE 0.9 % IV SOLN
200.0000 mg | Freq: Once | INTRAVENOUS | Status: AC
  Administered 2021-01-29: 200 mg via INTRAVENOUS
  Filled 2021-01-29: qty 8

## 2021-01-29 MED ORDER — SODIUM CHLORIDE 0.9 % IV SOLN: Freq: Once | INTRAVENOUS | Status: AC

## 2021-01-29 NOTE — Assessment & Plan Note (Signed)
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 Biopsy revealed IDC grade 3, ER 0%, PR 0%, Ki-67 80%, HER-2 -1+ T1c M0 stage IB  Treatment plan: 1.Bilateral mastectomies 01/09/2020: Right mastectomy:PASH Left mastectomy: Multifocal IDC grade 3 2.2 cm largest, high-grade DCIS, margins negative, 0/4 sentinel lymph nodes, ER negative, PR negative, HER-2 negative, Ki-67 80% 2.I recommended adjuvant chemotherapy with dose dense Adriamycin andCytoxanpembrolizumabfollowed by Taxol, carboplatin and pembrolizumabcompleted 08/28/2020 3.Maintenance Pembrolizumab ----------------------------------------------------------------------------------------------------------------------------------- Current treatment:Keytruda every three weeks(she will finish by end of December 2022) Labs reviewed  Chemo toxicities: 1.Peripheral neuropathy:resolved 2. Liver enzyme elevation:Monitoring labs closely 3.Diffuse body aches and pains: Probably from [place].I sent a prescription for magnesium today. 4.Profound fatigue:TSH 0.9 on 10/29/2020. No immune mediated adverse effects.  Abdominal pain and muscle and joint aches and pains: 11/30/2020: Liver: 2.1 cm enhancement nonspecific, no definite metastatic disease.  Left iliac bone lytic and sclerotic lesion: Nonspecific, multiple thyroid nodules. Bone lesions:12/18/20 bone scan: No abnormal activity to correspond to the small sclerotic foci in the left pelvis seen last month We will get a liver MRI for further evaluation (after she undergoes implant replacement) this will be at the end of November.  Return to clinic every 3 weeks for [facility] follow-up with me in 6 weeks.

## 2021-01-29 NOTE — Patient Instructions (Signed)
[facility]  Discharge Instructions: ?Thank you for choosing [facility] to provide your oncology and hematology care.  ? ?If you have a lab appointment with the Cancer Center, please go directly to the Cancer Center and check in at the registration area. ?  ?Wear comfortable clothing and clothing appropriate for easy access to any Portacath or PICC line.  ? ?We strive to give you quality time with your provider. You may need to reschedule your appointment if you arrive late (15 or more minutes).  Arriving late affects you and other patients whose appointments are after yours.  Also, if you miss three or more appointments without notifying the office, you may be dismissed from the clinic at the provider?s discretion.    ?  ?For prescription refill requests, have your pharmacy contact our office and allow 72 hours for refills to be completed.   ? ?Today you received the following chemotherapy and/or immunotherapy agents: Keytruda ?  ?To help prevent nausea and vomiting after your treatment, we encourage you to take your nausea medication as directed. ? ?BELOW ARE SYMPTOMS THAT SHOULD BE REPORTED IMMEDIATELY: ?*FEVER GREATER THAN 100.4 F (38 ?C) OR HIGHER ?*CHILLS OR SWEATING ?*NAUSEA AND VOMITING THAT IS NOT CONTROLLED WITH YOUR NAUSEA MEDICATION ?*UNUSUAL SHORTNESS OF BREATH ?*UNUSUAL BRUISING OR BLEEDING ?*URINARY PROBLEMS (pain or burning when urinating, or frequent urination) ?*BOWEL PROBLEMS (unusual diarrhea, constipation, pain near the anus) ?TENDERNESS IN MOUTH AND THROAT WITH OR WITHOUT PRESENCE OF ULCERS (sore throat, sores in mouth, or a toothache) ?UNUSUAL RASH, SWELLING OR PAIN  ?UNUSUAL VAGINAL DISCHARGE OR ITCHING  ? ?Items with * indicate a potential emergency and should be followed up as soon as possible or go to the Emergency Department if any problems should occur. ? ?Please show the CHEMOTHERAPY ALERT CARD or IMMUNOTHERAPY ALERT CARD at check-in to the  Emergency Department and triage nurse. ? ?Should you have questions after your visit or need to cancel or reschedule your appointment, please contact [facility]  Dept: [phone]  and follow the prompts.  Office hours are 8:00 a.m. to 4:30 p.m. Monday - Friday. Please note that voicemails left after 4:00 p.m. may not be returned until the following business day.  We are closed weekends and major holidays. You have access to a nurse at all times for urgent questions. Please call the main number to the clinic Dept: [phone] and follow the prompts. ? ? ?For any non-urgent questions, you may also contact your provider using MyChart. We now offer e-Visits for anyone 18 and older to request care online for non-urgent symptoms. For details visit [email or web address]. ?  ?Also download the MyChart app! Go to the app store, search "MyChart", open the app, select [facility], and log in with your MyChart username and password. ? ?Due to Covid, a mask is required upon entering the hospital/clinic. If you do not have a mask, one will be given to you upon arrival. For doctor visits, patients may have 1 support person aged 18 or older with them. For treatment visits, patients cannot have anyone with them due to current Covid guidelines and our immunocompromised population.  ? ?

## 2021-02-03 ENCOUNTER — Ambulatory Visit (HOSPITAL_COMMUNITY)
Admission: RE | Admit: 2021-02-03 | Discharge: 2021-02-03 | Disposition: A | Discharge status: Home / Self Care | Payer: BC Managed Care – PPO | Source: Ambulatory Visit | Attending: Hematology and Oncology | Admitting: Hematology and Oncology

## 2021-02-03 ENCOUNTER — Other Ambulatory Visit: Payer: Self-pay

## 2021-02-03 DIAGNOSIS — Z171 Estrogen receptor negative status [ER-]: Secondary | ICD-10-CM | POA: Diagnosis present

## 2021-02-03 DIAGNOSIS — C50412 Malignant neoplasm of upper-outer quadrant of left female breast: Secondary | ICD-10-CM | POA: Insufficient documentation

## 2021-02-03 MED ORDER — GADOBUTROL 1 MMOL/ML IV SOLN
7.0000 mL | Freq: Once | INTRAVENOUS | Status: AC | PRN
  Administered 2021-02-03: 7 mL via INTRAVENOUS

## 2021-02-12 ENCOUNTER — Other Ambulatory Visit: Payer: BC Managed Care – PPO

## 2021-02-12 ENCOUNTER — Ambulatory Visit: Payer: BC Managed Care – PPO

## 2021-02-18 ENCOUNTER — Telehealth: Payer: Self-pay | Admitting: *Deleted

## 2021-02-18 NOTE — Telephone Encounter (Signed)
Received call from pt stating she tested positive for Covid today.  Symptoms began on 02/16/21 and consist of sinus pressure and cough.  Pt denies fever at this time.  RN reviewed with MD who has stated pt does not meet criteria for Molnupiravir at this time.  Pt educated to take OTC mucinex DM to help with congestion.  RN also educated pt to take oral temperature every 4 hours and to call the office if a fever develops.  RN canceled upcoming appts and rescheduled to be 21 days out.  Pt educated and verbalized understanding.

## 2021-02-19 ENCOUNTER — Inpatient Hospital Stay: Payer: BC Managed Care – PPO

## 2021-02-19 ENCOUNTER — Inpatient Hospital Stay: Payer: BC Managed Care – PPO | Admitting: Adult Health

## 2021-02-28 ENCOUNTER — Other Ambulatory Visit: Payer: Self-pay

## 2021-03-01 ENCOUNTER — Encounter: Payer: Self-pay | Admitting: *Deleted

## 2021-03-05 ENCOUNTER — Other Ambulatory Visit: Payer: BC Managed Care – PPO

## 2021-03-05 ENCOUNTER — Ambulatory Visit: Payer: BC Managed Care – PPO | Admitting: Hematology and Oncology

## 2021-03-05 ENCOUNTER — Ambulatory Visit: Payer: BC Managed Care – PPO

## 2021-03-11 NOTE — Progress Notes (Signed)
Patient Care Team: Gara Kroner, DO as PCP - General (Family Medicine) [name], RN as Oncology Nurse Navigator [name], RN as Oncology Nurse Navigator [name], MD as Consulting Physician (General Surgery) [name], MD as Consulting Physician (Hematology and Oncology) [name], MD as Consulting Physician (Radiation Oncology)  DIAGNOSIS:    ICD-10-CM   1. Malignant neoplasm of upper-outer quadrant of left breast in female, estrogen receptor negative ([place]a [place])  C50.412    Z17.1       SUMMARY OF ONCOLOGIC HISTORY: Oncology History  Malignant neoplasm of upper-outer quadrant of left breast in female, estrogen receptor negative (A[place]a Burns)  11/29/2019 Initial Diagnosis   Patient palpated an area of concern in the left breast. Diagnostic mammogram and US showed a 1.8cm mass in the upper outer right breast representing a fibroadenoma, and in the left breast, three adjacent masses at the 2-2:30 position, 1.7cm, 0.8cm, and 1.4cm, with several cysts and no axillary adenopathy. Biopsy showed a benign fibroadenoma in the right breast, and in the left breast, IDC at the 2:30 position, grade 3, HER-2 negative (1+), ER+ 0%, PR+ 0%, Ki67 80%.    12/04/2019 Cancer Staging   Staging form: Breast, AJCC 8th Edition - Clinical: Stage IB (cT1c, cN0, cM0, G3, ER-, PR-, HER2-) - Signed by [name], MD on 12/04/2019    12/21/2019 Genetic Testing   Negative genetic testing:  No pathogenic variants detected on the [facility] Multi-Cancer Panel. A variant of uncertain significance (VUS) was detected in the CDK4 gene called c.415C>T (p.Arg139*). The report date is 12/21/2019.  The Multi-Cancer Panel offered by [facility] includes sequencing and/or deletion duplication testing of the following 85 genes: AIP, ALK, APC, ATM, AXIN2,BAP1,  BARD1, BLM, BMPR1A, BRCA1, BRCA2, BRIP1, CASR, CDC73, CDH1, CDK4, CDKN1B, CDKN1C, CDKN2A (p14ARF), CDKN2A (p16INK4a), CEBPA, CHEK2, CTNNA1, DICER1, DIS3L2, EGFR  (c.2369C>T, p.Thr790Met variant only), EPCAM (Deletion/duplication testing only), FH, FLCN, GATA2, GPC3, GREM1 (Promoter region deletion/duplication testing only), HOXB13 (c.251G>A, p.Gly84Glu), HRAS, KIT, MAX, MEN1, MET, MITF (c.952G>A, p.Glu318Lys variant only), MLH1, MSH2, MSH3, MSH6, MUTYH, NBN, NF1, NF2, NTHL1, PALB2, PDGFRA, PHOX2B, PMS2, POLD1, POLE, POT1, PRKAR1A, PTCH1, PTEN, RAD50, RAD51C, RAD51D, RB1, RECQL4, RET, RNF43, RUNX1, SDHAF2, SDHA (sequence changes only), SDHB, SDHC, SDHD, SMAD4, SMARCA4, SMARCB1, SMARCE1, STK11, SUFU, TERC, TERT, TMEM127, TP53, TSC1, TSC2, VHL, WRN and WT1.    01/09/2020 Surgery   Bilateral mastectomies with reconstruction [facility]im[facility]):  Right breast: no evidence of malignancy Left breast: multifocal invasive ductal carcinoma, grade 3, largest 2.2cm, with high grade DCIS, clear margins, 4 left axillary lymph nodes negative for carcinoma.   03/18/2020 - 08/28/2020 Chemotherapy   Adriamycin, Cytoxan Keytruda followed by Raiford Noble [facility]   09/17/2020 -  Chemotherapy   Patient is on Treatment Plan : BREAST Pembrolizumab q21d       CHIEF COMPLIANT: Cycle 7 Keytruda  INTERVAL HISTORY: Amanda Burns is a 40 y.o. with above-mentioned history of cancer who underwent bilateral mastectomies with reconstruction. She presents to the clinic for cycle 7 of Keytruda.  Tolerating Keytruda fairly well without any problems or concerns.  She does feel significant fatigue which has been an ongoing issue.  She was found to be iron deficient.  ALLERGIES:  is allergic to benzoyl peroxide, doxylamine-phenylephrine, and hydrocodone.  MEDICATIONS:  Current Outpatient Medications  Medication Sig Dispense Refill   acetaminophen (TYLENOL) 500 MG tablet Take 1,000 mg by mouth every 6 (six) hours as needed for moderate pain or headache.     ALPRAZolam (  XANAX) 0.5 MG tablet Take 1 tablet (0.5 mg total) by mouth 3 (three) times daily as needed for anxiety. 60 tablet 1    amphetamine-dextroamphetamine (ADDERALL XR) 20 MG 24 hr capsule Take 20 mg by mouth daily.      BIOTIN PO Take by mouth.     bismuth subsalicylate (PEPTO BISMOL) 262 MG/15ML suspension Take 30 mLs by mouth every 6 (six) hours as needed for indigestion or diarrhea or loose stools.     buPROPion (WELLBUTRIN XL) 150 MG 24 hr tablet Take 150 mg by mouth daily.     escitalopram (LEXAPRO) 20 MG tablet Take 20 mg by mouth daily.     fluticasone (FLONASE) 50 MCG/ACT nasal spray Place 1 spray into both nostrils daily as needed for allergies or rhinitis.     ibuprofen (ADVIL) 200 MG tablet Take 400 mg by mouth every 6 (six) hours as needed for headache or moderate pain.     magnesium oxide (MAG-OX) 400 (240 Mg) MG tablet Take 1 tablet (400 mg total) by mouth daily. 30 tablet 6   omeprazole (PRILOSEC) 20 MG capsule Take 40 mg by mouth daily.     spironolactone (ALDACTONE) 50 MG tablet Take 50 mg by mouth daily.     No current facility-administered medications for this visit.   Facility-Administered Medications Ordered in Other Visits  Medication Dose Route Frequency Provider Last Rate Last Admin   sodium chloride flush (NS) 0.9 % injection 10 mL  10 mL Intravenous PRN [name], MD   10 mL at 03/18/20 1033    PHYSICAL EXAMINATION: ECOG PERFORMANCE STATUS: 1 - Symptomatic but completely ambulatory  Vitals:   03/12/21 1000  BP: 114/74  Pulse: 88  Resp: 18  Temp: 97.6 F (36.4 C)  SpO2: 100%   Filed Weights   03/12/21 1000  Weight: 173 lb 5 oz (78.6 kg)    LABORATORY DATA:  I have reviewed the data as listed CMP Latest Ref Rng & Units 01/29/2021 12/10/2020 11/19/2020  Glucose 70 - 99 mg/dL 114(H) 122(H) 106(H)  BUN 6 - 20 mg/dL $Remove'13 10 11  'txUHvgR$ Creatinine 0.44 - 1.00 mg/dL 0.82 0.82 0.82  Sodium 135 - 145 mmol/L 138 139 140  Potassium 3.5 - 5.1 mmol/L 3.9 3.9 4.0  Chloride 98 - 111 mmol/L 103 103 103  CO2 22 - 32 mmol/L $RemoveB'25 25 26  'YbtZSpsQ$ Calcium 8.9 - 10.3 mg/dL 9.4 9.5 9.9  Total Protein 6.5 - 8.1  g/dL 7.9 7.3 7.7  Total Bilirubin 0.3 - 1.2 mg/dL <0.2(L) 0.2(L) 0.3  Alkaline Phos 38 - 126 U/L 172(H) 120 127(H)  AST 15 - 41 U/L $Remo'21 23 25  'vBPeI$ ALT 0 - 44 U/L 32 34 43    Lab Results  Component Value Date   WBC 6.9 03/12/2021   HGB 10.5 (L) 03/12/2021   HCT 32.7 (L) 03/12/2021   MCV 73.5 (L) 03/12/2021   PLT 385 03/12/2021   NEUTROABS 4.9 03/12/2021    ASSESSMENT & PLAN:  Malignant neoplasm of upper-outer quadrant of left breast in female, estrogen receptor negative ([facility]) Palpable left breast mass: 1.8 cm UOQ fibroadenoma in the right breast, left breast 3 masses 2 to 2:30 position: 1.7, 1.8, 1.4 cm.  Biopsy revealed IDC grade 3, ER 0%, PR 0%, Ki-67 80%, HER-2 -1+ T1c M0 stage IB   Treatment plan: 1.    Bilateral mastectomies 01/09/2020: Right mastectomy:PASH Left mastectomy: Multifocal IDC grade 3 2.2 cm largest, high-grade DCIS, margins negative, 0/4 sentinel lymph nodes, ER  negative, PR negative, HER-2 negative, Ki-67 80% 2.  I recommended adjuvant chemotherapy with dose dense Adriamycin and Cytoxan pembrolizumab followed by Taxol, carboplatin and pembrolizumab completed 08/28/2020 3.  Maintenance Pembrolizumab completed 03/12/2021 ----------------------------------------------------------------------------------------------------------------------------------- Current treatment: Keytruda every three weeks (she will finish by end of December 2022) Labs reviewed   Chemo toxicities: 1.  Peripheral neuropathy: resolved 2. Liver enzyme elevation: Monitoring labs closely  3.  Diffuse body aches and pains: Probably from [place].  I sent a prescription for magnesium today. 4.  Profound fatigue: TSH 0.9 on 10/29/2020. 5.  Microcytic anemia: Iron studies reveal iron deficiency.  I recommended that she get IV iron with each of her Keytruda treatments every 3 weeks. No immune mediated adverse effects.   Abdominal pain and muscle and joint aches and pains: 11/30/2020: Liver: 2.1 cm  enhancement nonspecific, no definite metastatic disease.  Left iliac bone lytic and sclerotic lesion: Nonspecific, multiple thyroid nodules. Bone lesions: 12/18/20 bone scan: No abnormal activity to correspond to the small sclerotic foci in the left pelvis seen last month Liver MRI 02/05/2021: Findings compatible with benign focal nodular hyperplasia.  6 to 26-month follow-up recommended    She will finish her immunotherapy on 05/14/2021 (this is the day before her birthday and she is very excited about that)  Return to clinic in 3 months for survivorship care plan visit    No orders of the defined types were placed in this encounter.  The patient has a good understanding of the overall plan. she agrees with it. she will call with any problems that may develop before the next visit here.  Total time spent: 30 mins including face to face time and time spent for planning, charting and coordination of care  Rulon Eisenmenger, MD, MPH 03/12/2021  I, Thana Ates, am acting as scribe for Dr. [name].  I have reviewed the above documentation for accuracy and completeness, and I agree with the above.

## 2021-03-12 ENCOUNTER — Inpatient Hospital Stay: Payer: BC Managed Care – PPO | Attending: Hematology and Oncology

## 2021-03-12 ENCOUNTER — Other Ambulatory Visit: Payer: Self-pay

## 2021-03-12 ENCOUNTER — Inpatient Hospital Stay: Payer: BC Managed Care – PPO

## 2021-03-12 ENCOUNTER — Inpatient Hospital Stay (HOSPITAL_BASED_OUTPATIENT_CLINIC_OR_DEPARTMENT_OTHER): Payer: BC Managed Care – PPO | Admitting: Hematology and Oncology

## 2021-03-12 DIAGNOSIS — Z79899 Other long term (current) drug therapy: Secondary | ICD-10-CM | POA: Diagnosis not present

## 2021-03-12 DIAGNOSIS — D241 Benign neoplasm of right breast: Secondary | ICD-10-CM | POA: Insufficient documentation

## 2021-03-12 DIAGNOSIS — Z23 Encounter for immunization: Secondary | ICD-10-CM | POA: Insufficient documentation

## 2021-03-12 DIAGNOSIS — Z171 Estrogen receptor negative status [ER-]: Secondary | ICD-10-CM | POA: Diagnosis present

## 2021-03-12 DIAGNOSIS — R7401 Elevation of levels of liver transaminase levels: Secondary | ICD-10-CM | POA: Insufficient documentation

## 2021-03-12 DIAGNOSIS — R5383 Other fatigue: Secondary | ICD-10-CM | POA: Insufficient documentation

## 2021-03-12 DIAGNOSIS — C50412 Malignant neoplasm of upper-outer quadrant of left female breast: Secondary | ICD-10-CM | POA: Diagnosis present

## 2021-03-12 DIAGNOSIS — E039 Hypothyroidism, unspecified: Secondary | ICD-10-CM | POA: Diagnosis not present

## 2021-03-12 DIAGNOSIS — D649 Anemia, unspecified: Secondary | ICD-10-CM | POA: Insufficient documentation

## 2021-03-12 DIAGNOSIS — Z9013 Acquired absence of bilateral breasts and nipples: Secondary | ICD-10-CM | POA: Diagnosis not present

## 2021-03-12 DIAGNOSIS — Z803 Family history of malignant neoplasm of breast: Secondary | ICD-10-CM | POA: Insufficient documentation

## 2021-03-12 DIAGNOSIS — Z95828 Presence of other vascular implants and grafts: Secondary | ICD-10-CM

## 2021-03-12 LAB — CMP (CANCER CENTER ONLY)
ALT: 30 U/L (ref 0–44)
AST: 21 U/L (ref 15–41)
Albumin: 4.4 g/dL (ref 3.5–5.0)
Alkaline Phosphatase: 127 U/L — ABNORMAL HIGH (ref 38–126)
Anion gap: 5 (ref 5–15)
BUN: 11 mg/dL (ref 6–20)
CO2: 30 mmol/L (ref 22–32)
Calcium: 9.7 mg/dL (ref 8.9–10.3)
Chloride: 101 mmol/L (ref 98–111)
Creatinine: 0.87 mg/dL (ref 0.44–1.00)
GFR, Estimated: >60 mL/min (ref >60)
Glucose, Bld: 93 mg/dL (ref 70–99)
Potassium: 4.3 mmol/L (ref 3.5–5.1)
Sodium: 136 mmol/L (ref 135–145)
Total Bilirubin: 0.3 mg/dL (ref 0.3–1.2)
Total Protein: 7.7 g/dL (ref 6.5–8.1)

## 2021-03-12 LAB — TSH: TSH: 1.053 u[IU]/mL (ref 0.308–3.960)

## 2021-03-12 LAB — CBC WITH DIFFERENTIAL (CANCER CENTER ONLY)
Abs Immature Granulocytes: 0.01 10*3/uL (ref 0.00–0.07)
Basophils Absolute: 0 10*3/uL (ref 0.0–0.1)
Basophils Relative: 1 %
Eosinophils Absolute: 0.2 10*3/uL (ref 0.0–0.5)
Eosinophils Relative: 3 %
HCT: 32.7 % — ABNORMAL LOW (ref 36.0–46.0)
Hemoglobin: 10.5 g/dL — ABNORMAL LOW (ref 12.0–15.0)
Immature Granulocytes: 0 %
Lymphocytes Relative: 20 %
Lymphs Abs: 1.4 10*3/uL (ref 0.7–4.0)
MCH: 23.6 pg — ABNORMAL LOW (ref 26.0–34.0)
MCHC: 32.1 g/dL (ref 30.0–36.0)
MCV: 73.5 fL — ABNORMAL LOW (ref 80.0–100.0)
Monocytes Absolute: 0.4 10*3/uL (ref 0.1–1.0)
Monocytes Relative: 6 %
Neutro Abs: 4.9 10*3/uL (ref 1.7–7.7)
Neutrophils Relative %: 70 %
Platelet Count: 385 10*3/uL (ref 150–400)
RBC: 4.45 MIL/uL (ref 3.87–5.11)
RDW: 16.9 % — ABNORMAL HIGH (ref 11.5–15.5)
WBC Count: 6.9 10*3/uL (ref 4.0–10.5)
nRBC: 0 % (ref 0.0–0.2)

## 2021-03-12 LAB — T4, FREE: Free T4: 1.81 ng/dL — ABNORMAL HIGH (ref 0.61–1.12)

## 2021-03-12 MED ORDER — SODIUM CHLORIDE 0.9% FLUSH
10.0000 mL | Freq: Once | INTRAVENOUS | Status: AC
  Administered 2021-03-12: 10:00:00 10 mL

## 2021-03-12 MED ORDER — SODIUM CHLORIDE 0.9 % IV SOLN
200.0000 mg | Freq: Once | INTRAVENOUS | Status: AC
  Administered 2021-03-12: 200 mg via INTRAVENOUS
  Filled 2021-03-12: qty 8

## 2021-03-12 MED ORDER — SODIUM CHLORIDE 0.9 % IV SOLN: Freq: Once | INTRAVENOUS | Status: AC

## 2021-03-12 MED ORDER — HEPARIN SOD (PORK) LOCK FLUSH 100 UNIT/ML IV SOLN
500.0000 [IU] | Freq: Once | INTRAVENOUS | Status: AC | PRN
  Administered 2021-03-12: 12:00:00 500 [IU]

## 2021-03-12 MED ORDER — INFLUENZA VAC SPLIT QUAD 0.5 ML IM SUSY
0.5000 mL | PREFILLED_SYRINGE | Freq: Once | INTRAMUSCULAR | Status: AC
  Administered 2021-03-12: 11:00:00 0.5 mL via INTRAMUSCULAR
  Filled 2021-03-12: qty 0.5

## 2021-03-12 MED ORDER — SODIUM CHLORIDE 0.9% FLUSH
10.0000 mL | INTRAVENOUS | Status: DC | PRN
  Administered 2021-03-12: 12:00:00 10 mL

## 2021-03-12 NOTE — Patient Instructions (Signed)
[facility]COL[facility]  Discharge Instructions: Thank you for choosing [facility] to provide your oncology and hematology care.   If you have a lab appointment with the Cancer Center, please go directly to the Cancer Center and check in at the registration area.   Wear comfortable clothing and clothing appropriate for easy access to any Portacath or PICC line.   We strive to give you quality time with your provider. You may need to reschedule your appointment if you arrive late (15 or more minutes).  Arriving late affects you and other patients whose appointments are after yours.  Also, if you miss three or more appointments without notifying the office, you may be dismissed from the clinic at the provider's discretion.      For prescription refill requests, have your pharmacy contact our office and allow 72 hours for refills to be completed.    Today you received the following chemotherapy and/or immunotherapy agents: keytruda      To help prevent nausea and vomiting after your treatment, we encourage you to take your nausea medication as directed.  BELOW ARE SYMPTOMS THAT SHOULD BE REPORTED IMMEDIATELY: *FEVER GREATER THAN 100.4 F (38 C) OR HIGHER *CHILLS OR SWEATING *NAUSEA AND VOMITING THAT IS NOT CONTROLLED WITH YOUR NAUSEA MEDICATION *UNUSUAL SHORTNESS OF BREATH *UNUSUAL BRUISING OR BLEEDING *URINARY PROBLEMS (pain or burning when urinating, or frequent urination) *BOWEL PROBLEMS (unusual diarrhea, constipation, pain near the anus) TENDERNESS IN MOUTH AND THROAT WITH OR WITHOUT PRESENCE OF ULCERS (sore throat, sores in mouth, or a toothache) UNUSUAL RASH, SWELLING OR PAIN  UNUSUAL VAGINAL DISCHARGE OR ITCHING   Items with * indicate a potential emergency and should be followed up as soon as possible or go to the Emergency Department if any problems should occur.  Please show the CHEMOTHERAPY ALERT CARD or IMMUNOTHERAPY ALERT CARD at check-in to  the Emergency Department and triage nurse.  Should you have questions after your visit or need to cancel or reschedule your appointment, please contact [facility]  Dept: [phone]  and follow the prompts.  Office hours are 8:00 a.m. to 4:30 p.m. Monday - Friday. Please note that voicemails left after 4:00 p.m. may not be returned until the following business day.  We are closed weekends and major holidays. You have access to a nurse at all times for urgent questions. Please call the main number to the clinic Dept: [phone] and follow the prompts.   For any non-urgent questions, you may also contact your provider using MyChart. We now offer e-Visits for anyone 18 and older to request care online for non-urgent symptoms. For details visit [email or web address].   Also download the MyChart app! Go to the app store, search "MyChart", open the app, select , and log in with your MyChart username and password.  Due to Covid, a mask is required upon entering the hospital/clinic. If you do not have a mask, one will be given to you upon arrival. For doctor visits, patients may have 1 support person aged 18 or older with them. For treatment visits, patients cannot have anyone with them due to current Covid guidelines and our immunocompromised population.   

## 2021-03-12 NOTE — Assessment & Plan Note (Signed)
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 Biopsy revealed IDC grade 3, ER 0%, PR 0%, Ki-67 80%, HER-2 -1+ T1c M0 stage IB  Treatment plan: 1.Bilateral mastectomies 01/09/2020: Right mastectomy:PASH Left mastectomy: Multifocal IDC grade 3 2.2 cm largest, high-grade DCIS, margins negative, 0/4 sentinel lymph nodes, ER negative, PR negative, HER-2 negative, Ki-67 80% 2.I recommended adjuvant chemotherapy with dose dense Adriamycin andCytoxanpembrolizumabfollowed by Taxol, carboplatin and pembrolizumabcompleted 08/28/2020 3.Maintenance Pembrolizumab completed 03/12/2021 ----------------------------------------------------------------------------------------------------------------------------------- Current treatment:Keytruda every three weeks(she will finish by end of December 2022) Labs reviewed  Chemo toxicities: 1.Peripheral neuropathy:resolved 2. Liver enzyme elevation:Monitoring labs closely 3.Diffuse body aches and pains: Probably from [place].I sent a prescription for magnesium today. 4.Profound fatigue:TSH 0.9 on 10/29/2020. 5.  Microcytic anemia: We will obtain iron studies today and determine if she needs oral iron or IV iron. No immune mediated adverse effects.  Abdominal pain and muscle and joint aches and pains: 11/30/2020: Liver: 2.1 cm enhancement nonspecific, no definite metastatic disease. Left iliac bone lytic and sclerotic lesion: Nonspecific, multiple thyroid nodules. Bone lesions:12/18/20 bone scan: No abnormal activity to correspond to the small sclerotic foci in the left pelvis seen last month Liver MRI 02/05/2021: Findings compatible with benign focal nodular hyperplasia.  6 to 50-month follow-up recommended  This will conclude her treatment with immunotherapy. Port can be removed.   Return to clinic in 3 months for survivorship care plan visit

## 2021-03-17 ENCOUNTER — Encounter: Payer: Self-pay | Admitting: *Deleted

## 2021-03-18 ENCOUNTER — Other Ambulatory Visit: Payer: Self-pay

## 2021-03-18 MED ORDER — MAGNESIUM OXIDE -MG SUPPLEMENT 400 (240 MG) MG PO TABS: 400.0000 mg | ORAL_TABLET | Freq: Every day | ORAL | 6 refills | Status: DC

## 2021-03-30 ENCOUNTER — Encounter: Payer: Self-pay | Admitting: Hematology and Oncology

## 2021-03-30 NOTE — Progress Notes (Signed)
Informed by Mickel Baas in pharmacy patient has free drug assistance for [facility] therefore copay assistance not needed from me.

## 2021-04-01 NOTE — Progress Notes (Signed)
Patient Care Team: Amanda Kroner, DO as PCP - General (Family Medicine) Amanda Kaufmann, RN as Oncology Nurse Navigator Amanda Germany, RN as Oncology Nurse Navigator Amanda Klein, MD as Consulting Physician (General Surgery) Amanda Lose, MD as Consulting Physician (Hematology and Oncology) Amanda Pray, MD as Consulting Physician (Radiation Oncology)  DIAGNOSIS:    ICD-10-CM   1. Malignant neoplasm of upper-outer quadrant of left breast in female, estrogen receptor negative ([name])  C50.412    Z17.1       SUMMARY OF ONCOLOGIC HISTORY: Oncology History  Malignant neoplasm of upper-outer quadrant of left breast in female, estrogen receptor negative ([name])  11/29/2019 Initial Diagnosis   Patient palpated an area of concern in the left breast. Diagnostic mammogram and US showed a 1.8cm mass in the upper outer right breast representing a fibroadenoma, and in the left breast, three adjacent masses at the 2-2:30 position, 1.7cm, 0.8cm, and 1.4cm, with several cysts and no axillary adenopathy. Biopsy showed a benign fibroadenoma in the right breast, and in the left breast, IDC at the 2:30 position, grade 3, HER-2 negative (1+), ER+ 0%, PR+ 0%, Ki67 80%.    12/04/2019 Cancer Staging   Staging form: Breast, AJCC 8th Edition - Clinical: Stage IB (cT1c, cN0, cM0, G3, ER-, PR-, HER2-) - Signed by Amanda Lose, MD on 12/04/2019    12/21/2019 Genetic Testing   Negative genetic testing:  No pathogenic variants detected on the [facility] Multi-Cancer Panel. A variant of uncertain significance (VUS) was detected in the CDK4 gene called c.415C>T (p.Arg139*). The report date is 12/21/2019.  The Multi-Cancer Panel offered by [facility] includes sequencing and/or deletion duplication testing of the following 85 genes: AIP, ALK, APC, ATM, AXIN2,BAP1,  BARD1, BLM, BMPR1A, BRCA1, BRCA2, BRIP1, CASR, CDC73, CDH1, CDK4, CDKN1B, CDKN1C, CDKN2A (p14ARF), CDKN2A (p16INK4a), CEBPA, CHEK2, CTNNA1, DICER1, DIS3L2, EGFR  (c.2369C>T, p.Thr790Met variant only), EPCAM (Deletion/duplication testing only), FH, FLCN, GATA2, GPC3, GREM1 (Promoter region deletion/duplication testing only), HOXB13 (c.251G>A, p.Gly84Glu), HRAS, KIT, MAX, MEN1, MET, MITF (c.952G>A, p.Glu318Lys variant only), MLH1, MSH2, MSH3, MSH6, MUTYH, NBN, NF1, NF2, NTHL1, PALB2, PDGFRA, PHOX2B, PMS2, POLD1, POLE, POT1, PRKAR1A, PTCH1, PTEN, RAD50, RAD51C, RAD51D, RB1, RECQL4, RET, RNF43, RUNX1, SDHAF2, SDHA (sequence changes only), SDHB, SDHC, SDHD, SMAD4, SMARCA4, SMARCB1, SMARCE1, STK11, SUFU, TERC, TERT, TMEM127, TP53, TSC1, TSC2, VHL, WRN and WT1.    01/09/2020 Surgery   Bilateral mastectomies with reconstruction Amanda Burns & Amanda Burns):  Right breast: no evidence of malignancy Left breast: multifocal invasive ductal carcinoma, grade 3, largest 2.2cm, with high grade DCIS, clear margins, 4 left axillary lymph nodes negative for carcinoma.   03/18/2020 - 08/28/2020 Chemotherapy   Adriamycin, Cytoxan Keytruda followed by Amanda Burns Amanda Burns   09/17/2020 -  Chemotherapy   Patient is on Treatment Plan : BREAST Pembrolizumab q21d       CHIEF COMPLIANT: Cycle 8 Keytruda  INTERVAL HISTORY: Amanda Burns is a 41 y.o. with above-mentioned history of breast cancer who underwent bilateral mastectomies with reconstruction. She presents to the clinic for cycle 8 of Keytruda.  She does not have any major side effects to [facility].  She has had some fatigue and was found to be iron deficient and therefore we are giving her IV iron today.  ALLERGIES:  is allergic to benzoyl peroxide, doxylamine-phenylephrine, and hydrocodone.  MEDICATIONS:  Current Outpatient Medications  Medication Sig Dispense Refill   acetaminophen (TYLENOL) 500 MG tablet Take 1,000 mg by mouth every 6 (six) hours as needed for moderate pain or headache.  ALPRAZolam (XANAX) 0.5 MG tablet Take 1 tablet (0.5 mg total) by mouth 3 (three) times daily as needed for anxiety. 60 tablet 1    amphetamine-dextroamphetamine (ADDERALL XR) 20 MG 24 hr capsule Take 20 mg by mouth daily.      BIOTIN PO Take by mouth.     bismuth subsalicylate (PEPTO BISMOL) 262 MG/15ML suspension Take 30 mLs by mouth every 6 (six) hours as needed for indigestion or diarrhea or loose stools.     buPROPion (WELLBUTRIN XL) 150 MG 24 hr tablet Take 150 mg by mouth daily.     escitalopram (LEXAPRO) 20 MG tablet Take 20 mg by mouth daily.     fluticasone (FLONASE) 50 MCG/ACT nasal spray Place 1 spray into both nostrils daily as needed for allergies or rhinitis.     ibuprofen (ADVIL) 200 MG tablet Take 400 mg by mouth every 6 (six) hours as needed for headache or moderate pain.     magnesium oxide (MAG-OX) 400 (240 Mg) MG tablet Take 1 tablet (400 mg total) by mouth daily. 30 tablet 6   omeprazole (PRILOSEC) 20 MG capsule Take 40 mg by mouth daily.     spironolactone (ALDACTONE) 50 MG tablet Take 50 mg by mouth daily.     No current facility-administered medications for this visit.   Facility-Administered Medications Ordered in Other Visits  Medication Dose Route Frequency Provider Last Rate Last Admin   sodium chloride flush (NS) 0.9 % injection 10 mL  10 mL Intravenous PRN Amanda Lose, MD   10 mL at 03/18/20 1033    PHYSICAL EXAMINATION: ECOG PERFORMANCE STATUS: 1 - Symptomatic but completely ambulatory  Vitals:   04/02/21 0847  BP: 120/72  Pulse: (!) 104  Resp: 18  Temp: (!) 97.5 F (36.4 C)  SpO2: 100%   Filed Weights   04/02/21 0847  Weight: 175 lb 14.4 oz (79.8 kg)    LABORATORY DATA:  I have reviewed the data as listed CMP Latest Ref Rng & Units 03/12/2021 01/29/2021 12/10/2020  Glucose 70 - 99 mg/dL 93 114(H) 122(H)  BUN 6 - 20 mg/dL _0 Creatinine 0.44 - 1.00 mg/dL 0.87 0.82 0.82  Sodium 135 - 145 mmol/L 136 138 139  Potassium 3.5 - 5.1 mmol/L 4.3 3.9 3.9  Chloride 98 - 111 mmol/L 101 103 103  CO2 22 - 32 mmol/L _1 Calcium 8.9 - 10.3 mg/dL 9.7 9.4 9.5  Total Protein  6.5 - 8.1 g/dL 7.7 7.9 7.3  Total Bilirubin 0.3 - 1.2 mg/dL 0.3 <0.2(L) 0.2(L)  Alkaline Phos 38 - 126 U/L 127(H) 172(H) 120  AST 15 - 41 U/L _2 ALT 0 - 44 U/L 30 32 34    Lab Results  Component Value Date   WBC 6.9 04/02/2021   HGB 10.3 (L) 04/02/2021   HCT 32.0 (L) 04/02/2021   MCV 74.8 (L) 04/02/2021   PLT 318 04/02/2021   NEUTROABS 5.1 04/02/2021    ASSESSMENT & PLAN:  Malignant neoplasm of upper-outer quadrant of left breast in female, estrogen receptor negative ([place]) Palpable left breast mass: 1.8 cm UOQ fibroadenoma in the right breast, left breast 3 masses 2 to 2:30 position: 1.7, 1.8, 1.4 cm.  Biopsy revealed IDC grade 3, ER 0%, PR 0%, Ki-67 80%, HER-2 -1+ T1c M0 stage IB   Treatment plan: 1.    Bilateral mastectomies 01/09/2020: Right mastectomy:PASH Left mastectomy: Multifocal IDC grade 3 2.2 cm largest, high-grade DCIS, margins negative, 0/4 sentinel  lymph nodes, ER negative, PR negative, HER-2 negative, Ki-67 80% 2.  I recommended adjuvant chemotherapy with dose dense Adriamycin and Cytoxan pembrolizumab followed by Taxol, carboplatin and pembrolizumab completed 08/28/2020 3.  Maintenance Pembrolizumab completed 03/12/2021 ----------------------------------------------------------------------------------------------------------------------------------- Chemo toxicities: 1.  Peripheral neuropathy: resolved 2. Liver enzyme elevation: Monitoring labs closely  3.  Diffuse body aches and pains: Probably from [place].  I sent a prescription for magnesium today. 4.  Profound fatigue: TSH 0.9 on 10/29/2020. 5.  Microcytic anemia: Iron studies reveal iron deficiency.  Iron infusion will be given with each of her Keytruda treatments. No immune mediated adverse effects.   Abdominal pain and muscle and joint aches and pains: 11/30/2020: Liver: 2.1 cm enhancement nonspecific, no definite metastatic disease.  Left iliac bone lytic and sclerotic lesion: Nonspecific, multiple  thyroid nodules. Bone lesions: 12/18/20 bone scan: No abnormal activity to correspond to the small sclerotic foci in the left pelvis seen last month Liver MRI 02/05/2021: Findings compatible with benign focal nodular hyperplasia.  6 to 62-monthfollow-up recommended    Return to clinic in 3 weeks for follow-up with Keytruda.    No orders of the defined types were placed in this encounter.  The patient has a good understanding of the overall plan. she agrees with it. she will call with any problems that may develop before the next visit here.  Total time spent: 30 mins including face to face time and time spent for planning, charting and coordination of care  VRulon Eisenmenger MD, MPH 04/02/2021  I, KThana Ates am acting as scribe for Dr. VNicholas Burns  I have reviewed the above documentation for accuracy and completeness, and I agree with the above.

## 2021-04-01 NOTE — Assessment & Plan Note (Signed)
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 Biopsy revealed IDC grade 3, ER 0%, PR 0%, Ki-67 80%, HER-2 -1+ T1c M0 stage IB  Treatment plan: 1.Bilateral mastectomies 01/09/2020: Right mastectomy:PASH Left mastectomy: Multifocal IDC grade 3 2.2 cm largest, high-grade DCIS, margins negative, 0/4 sentinel lymph nodes, ER negative, PR negative, HER-2 negative, Ki-67 80% 2.I recommended adjuvant chemotherapy with dose dense Adriamycin andCytoxanpembrolizumabfollowed by Taxol, carboplatin and pembrolizumabcompleted 08/28/2020 3.Maintenance Pembrolizumab completed 03/12/2021 ----------------------------------------------------------------------------------------------------------------------------------- Chemo toxicities: 1.Peripheral neuropathy:resolved 2. Liver enzyme elevation:Monitoring labs closely 3.Diffuse body aches and pains: Probably from [place].I sent a prescription for magnesium today. 4.Profound fatigue:TSH 0.9 on 10/29/2020. 5.Microcytic anemia: Iron studies reveal iron deficiency.  I recommended that she get IV iron with each of her Keytruda treatments every 3 weeks. No immune mediated adverse effects.  Abdominal pain and muscle and joint aches and pains: 11/30/2020: Liver: 2.1 cm enhancement nonspecific, no definite metastatic disease. Left iliac bone lytic and sclerotic lesion: Nonspecific, multiple thyroid nodules. Bone lesions:10/7/22bone scan:No abnormal activity to correspond to the small sclerotic foci in the left pelvis seen last month Liver MRI 02/05/2021: Findings compatible with benign focal nodular hyperplasia.  6 to 67-month follow-up recommended  Return to clinic in 1 year for follow up

## 2021-04-02 ENCOUNTER — Inpatient Hospital Stay: Payer: BC Managed Care – PPO | Attending: Hematology and Oncology

## 2021-04-02 ENCOUNTER — Other Ambulatory Visit: Payer: Self-pay

## 2021-04-02 ENCOUNTER — Inpatient Hospital Stay: Payer: BC Managed Care – PPO

## 2021-04-02 ENCOUNTER — Inpatient Hospital Stay (HOSPITAL_BASED_OUTPATIENT_CLINIC_OR_DEPARTMENT_OTHER): Payer: BC Managed Care – PPO | Admitting: Hematology and Oncology

## 2021-04-02 VITALS — BP 116/68 | HR 95 | Temp 98.3°F | Resp 18

## 2021-04-02 DIAGNOSIS — Z5112 Encounter for antineoplastic immunotherapy: Secondary | ICD-10-CM | POA: Insufficient documentation

## 2021-04-02 DIAGNOSIS — C50412 Malignant neoplasm of upper-outer quadrant of left female breast: Secondary | ICD-10-CM

## 2021-04-02 DIAGNOSIS — Z95828 Presence of other vascular implants and grafts: Secondary | ICD-10-CM

## 2021-04-02 DIAGNOSIS — Z79899 Other long term (current) drug therapy: Secondary | ICD-10-CM | POA: Insufficient documentation

## 2021-04-02 DIAGNOSIS — D509 Iron deficiency anemia, unspecified: Secondary | ICD-10-CM | POA: Diagnosis not present

## 2021-04-02 DIAGNOSIS — R7401 Elevation of levels of liver transaminase levels: Secondary | ICD-10-CM | POA: Diagnosis not present

## 2021-04-02 DIAGNOSIS — R5383 Other fatigue: Secondary | ICD-10-CM | POA: Diagnosis not present

## 2021-04-02 DIAGNOSIS — Z171 Estrogen receptor negative status [ER-]: Secondary | ICD-10-CM | POA: Insufficient documentation

## 2021-04-02 DIAGNOSIS — Z803 Family history of malignant neoplasm of breast: Secondary | ICD-10-CM | POA: Insufficient documentation

## 2021-04-02 DIAGNOSIS — D241 Benign neoplasm of right breast: Secondary | ICD-10-CM | POA: Diagnosis not present

## 2021-04-02 DIAGNOSIS — Z9013 Acquired absence of bilateral breasts and nipples: Secondary | ICD-10-CM | POA: Insufficient documentation

## 2021-04-02 LAB — CBC WITH DIFFERENTIAL (CANCER CENTER ONLY)
Abs Immature Granulocytes: 0.02 10*3/uL (ref 0.00–0.07)
Basophils Absolute: 0 10*3/uL (ref 0.0–0.1)
Basophils Relative: 1 %
Eosinophils Absolute: 0.2 10*3/uL (ref 0.0–0.5)
Eosinophils Relative: 3 %
HCT: 32 % — ABNORMAL LOW (ref 36.0–46.0)
Hemoglobin: 10.3 g/dL — ABNORMAL LOW (ref 12.0–15.0)
Immature Granulocytes: 0 %
Lymphocytes Relative: 17 %
Lymphs Abs: 1.2 10*3/uL (ref 0.7–4.0)
MCH: 24.1 pg — ABNORMAL LOW (ref 26.0–34.0)
MCHC: 32.2 g/dL (ref 30.0–36.0)
MCV: 74.8 fL — ABNORMAL LOW (ref 80.0–100.0)
Monocytes Absolute: 0.4 10*3/uL (ref 0.1–1.0)
Monocytes Relative: 6 %
Neutro Abs: 5.1 10*3/uL (ref 1.7–7.7)
Neutrophils Relative %: 73 %
Platelet Count: 318 10*3/uL (ref 150–400)
RBC: 4.28 MIL/uL (ref 3.87–5.11)
RDW: 16.5 % — ABNORMAL HIGH (ref 11.5–15.5)
WBC Count: 6.9 10*3/uL (ref 4.0–10.5)
nRBC: 0 % (ref 0.0–0.2)

## 2021-04-02 LAB — CMP (CANCER CENTER ONLY)
ALT: 25 U/L (ref 0–44)
AST: 17 U/L (ref 15–41)
Albumin: 4.4 g/dL (ref 3.5–5.0)
Alkaline Phosphatase: 128 U/L — ABNORMAL HIGH (ref 38–126)
Anion gap: 7 (ref 5–15)
BUN: 14 mg/dL (ref 6–20)
CO2: 26 mmol/L (ref 22–32)
Calcium: 9.6 mg/dL (ref 8.9–10.3)
Chloride: 103 mmol/L (ref 98–111)
Creatinine: 0.92 mg/dL (ref 0.44–1.00)
GFR, Estimated: >60 mL/min (ref >60)
Glucose, Bld: 88 mg/dL (ref 70–99)
Potassium: 4.2 mmol/L (ref 3.5–5.1)
Sodium: 136 mmol/L (ref 135–145)
Total Bilirubin: 0.3 mg/dL (ref 0.3–1.2)
Total Protein: 7.5 g/dL (ref 6.5–8.1)

## 2021-04-02 LAB — TSH: TSH: 0.918 u[IU]/mL (ref 0.308–3.960)

## 2021-04-02 LAB — T4, FREE: Free T4: 0.94 ng/dL (ref 0.61–1.12)

## 2021-04-02 MED ORDER — SODIUM CHLORIDE 0.9% FLUSH
10.0000 mL | Freq: Once | INTRAVENOUS | Status: AC
  Administered 2021-04-02: 10 mL

## 2021-04-02 MED ORDER — SODIUM CHLORIDE 0.9 % IV SOLN
200.0000 mg | Freq: Once | INTRAVENOUS | Status: AC
  Administered 2021-04-02: 200 mg via INTRAVENOUS
  Filled 2021-04-02: qty 8

## 2021-04-02 MED ORDER — SODIUM CHLORIDE 0.9 % IV SOLN
300.0000 mg | Freq: Once | INTRAVENOUS | Status: AC
  Administered 2021-04-02: 300 mg via INTRAVENOUS
  Filled 2021-04-02: qty 300

## 2021-04-02 MED ORDER — DIPHENHYDRAMINE HCL 50 MG/ML IJ SOLN
25.0000 mg | Freq: Once | INTRAMUSCULAR | Status: AC
  Administered 2021-04-02: 25 mg via INTRAVENOUS
  Filled 2021-04-02: qty 1

## 2021-04-02 MED ORDER — SODIUM CHLORIDE 0.9 % IV SOLN: Freq: Once | INTRAVENOUS | Status: AC

## 2021-04-02 NOTE — Patient Instructions (Signed)

## 2021-04-22 ENCOUNTER — Encounter: Payer: Self-pay | Admitting: *Deleted

## 2021-04-22 NOTE — Assessment & Plan Note (Signed)
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 Biopsy revealed IDC grade 3, ER 0%, PR 0%, Ki-67 80%, HER-2 -1+ T1c M0 stage IB  Treatment plan: 1.Bilateral mastectomies 01/09/2020: Right mastectomy:PASH Left mastectomy: Multifocal IDC grade 3 2.2 cm largest, high-grade DCIS, margins negative, 0/4 sentinel lymph nodes, ER negative, PR negative, HER-2 negative, Ki-67 80% 2.I recommended adjuvant chemotherapy with dose dense Adriamycin andCytoxanpembrolizumabfollowed by Taxol, carboplatin and pembrolizumabcompleted 08/28/2020 3.Maintenance Pembrolizumabcompleted 03/12/2021 ----------------------------------------------------------------------------------------------------------------------------------- Chemo toxicities: 1.Peripheral neuropathy:resolved 2. Liver enzyme elevation:Monitoring labs closely 3.Diffuse body aches and pains: Probably from [place].I sent a prescription for magnesium today. 4.Profound fatigue:TSH 0.9 on 10/29/2020. 5.Microcytic anemia:Iron studies reveal iron deficiency.  Iron infusion will be given with each of her Keytruda treatments. No immune mediated adverse effects.  Abdominal pain and muscle and joint aches and pains: 11/30/2020: Liver: 2.1 cm enhancement nonspecific, no definite metastatic disease. Left iliac bone lytic and sclerotic lesion: Nonspecific, multiple thyroid nodules. Bone lesions:10/7/22bone scan:No abnormal activity to correspond to the small sclerotic foci in the left pelvis seen last month Liver MRI 02/05/2021: Findings compatible with benign focal nodular hyperplasia. 6 to 46-month follow-up recommended  Return to clinic in 3 weeks for follow-up with Keytruda.

## 2021-04-22 NOTE — Progress Notes (Signed)
Patient Care Team: Montez Hageman, DO as PCP - General (Family Medicine) Pershing Proud, RN as Oncology Nurse Navigator Donnelly Angelica, RN as Oncology Nurse Navigator Almond Lint, MD as Consulting Physician (General Surgery) Serena Croissant, MD as Consulting Physician (Hematology and Oncology) Antony Blackbird, MD as Consulting Physician (Radiation Oncology)  DIAGNOSIS:    ICD-10-CM   1. Malignant neoplasm of upper-outer quadrant of left breast in female, estrogen receptor negative (HCC)  C50.412    Z17.1       SUMMARY OF ONCOLOGIC HISTORY: Oncology History  Malignant neoplasm of upper-outer quadrant of left breast in female, estrogen receptor negative (HCC)  11/29/2019 Initial Diagnosis   Patient palpated an area of concern in the left breast. Diagnostic mammogram and US showed a 1.8cm mass in the upper outer right breast representing a fibroadenoma, and in the left breast, three adjacent masses at the 2-2:30 position, 1.7cm, 0.8cm, and 1.4cm, with several cysts and no axillary adenopathy. Biopsy showed a benign fibroadenoma in the right breast, and in the left breast, IDC at the 2:30 position, grade 3, HER-2 negative (1+), ER+ 0%, PR+ 0%, Ki67 80%.    12/04/2019 Cancer Staging   Staging form: Breast, AJCC 8th Edition - Clinical: Stage IB (cT1c, cN0, cM0, G3, ER-, PR-, HER2-) - Signed by Serena Croissant, MD on 12/04/2019    12/21/2019 Genetic Testing   Negative genetic testing:  No pathogenic variants detected on the [facility] Multi-Cancer Panel. A variant of uncertain significance (VUS) was detected in the CDK4 gene called c.415C>T (p.Arg139*). The report date is 12/21/2019.  The Multi-Cancer Panel offered by [facility] includes sequencing and/or deletion duplication testing of the following 85 genes: AIP, ALK, APC, ATM, AXIN2,BAP1,  BARD1, BLM, BMPR1A, BRCA1, BRCA2, BRIP1, CASR, CDC73, CDH1, CDK4, CDKN1B, CDKN1C, CDKN2A (p14ARF), CDKN2A (p16INK4a), CEBPA, CHEK2, CTNNA1, DICER1, DIS3L2, EGFR  (c.2369C>T, p.Thr790Met variant only), EPCAM (Deletion/duplication testing only), FH, FLCN, GATA2, GPC3, GREM1 (Promoter region deletion/duplication testing only), HOXB13 (c.251G>A, p.Gly84Glu), HRAS, KIT, MAX, MEN1, MET, MITF (c.952G>A, p.Glu318Lys variant only), MLH1, MSH2, MSH3, MSH6, MUTYH, NBN, NF1, NF2, NTHL1, PALB2, PDGFRA, PHOX2B, PMS2, POLD1, POLE, POT1, PRKAR1A, PTCH1, PTEN, RAD50, RAD51C, RAD51D, RB1, RECQL4, RET, RNF43, RUNX1, SDHAF2, SDHA (sequence changes only), SDHB, SDHC, SDHD, SMAD4, SMARCA4, SMARCB1, SMARCE1, STK11, SUFU, TERC, TERT, TMEM127, TP53, TSC1, TSC2, VHL, WRN and WT1.    01/09/2020 Surgery   Bilateral mastectomies with reconstruction [facility]):  Right breast: no evidence of malignancy Left breast: multifocal invasive ductal carcinoma, grade 3, largest 2.2cm, with high grade DCIS, clear margins, 4 left axillary lymph nodes negative for carcinoma.   03/18/2020 - 08/28/2020 Chemotherapy   Adriamycin, Cytoxan Keytruda followed by Dorice Lamas Harrell Gave   09/17/2020 -  Chemotherapy   Patient is on Treatment Plan : BREAST Pembrolizumab q21d       CHIEF COMPLIANT: Cycle 9 Keytruda  INTERVAL HISTORY: Amanda Burns is a 42 y.o. with above-mentioned history of breast cancer who underwent bilateral mastectomies with reconstruction. She presents to the clinic for cycle 9 of Keytruda.   ALLERGIES:  is allergic to benzoyl peroxide, doxylamine-phenylephrine, and hydrocodone.  MEDICATIONS:  Current Outpatient Medications  Medication Sig Dispense Refill   acetaminophen (TYLENOL) 500 MG tablet Take 1,000 mg by mouth every 6 (six) hours as needed for moderate pain or headache.     ALPRAZolam (XANAX) 0.5 MG tablet Take 1 tablet (0.5 mg total) by mouth 3 (three) times daily as needed for anxiety. 60 tablet 1   amphetamine-dextroamphetamine (ADDERALL XR)  20 MG 24 hr capsule Take 20 mg by mouth daily.      BIOTIN PO Take by mouth.     bismuth subsalicylate (PEPTO BISMOL) 262  MG/15ML suspension Take 30 mLs by mouth every 6 (six) hours as needed for indigestion or diarrhea or loose stools.     buPROPion (WELLBUTRIN XL) 150 MG 24 hr tablet Take 150 mg by mouth daily.     escitalopram (LEXAPRO) 20 MG tablet Take 20 mg by mouth daily.     fluticasone (FLONASE) 50 MCG/ACT nasal spray Place 1 spray into both nostrils daily as needed for allergies or rhinitis.     ibuprofen (ADVIL) 200 MG tablet Take 400 mg by mouth every 6 (six) hours as needed for headache or moderate pain.     magnesium oxide (MAG-OX) 400 (240 Mg) MG tablet Take 1 tablet (400 mg total) by mouth daily. 30 tablet 6   omeprazole (PRILOSEC) 20 MG capsule Take 40 mg by mouth daily.     spironolactone (ALDACTONE) 50 MG tablet Take 50 mg by mouth daily.     No current facility-administered medications for this visit.   Facility-Administered Medications Ordered in Other Visits  Medication Dose Route Frequency Provider Last Rate Last Admin   sodium chloride flush (NS) 0.9 % injection 10 mL  10 mL Intravenous PRN [name], MD   10 mL at 03/18/20 1033    PHYSICAL EXAMINATION: ECOG PERFORMANCE STATUS: 1 - Symptomatic but completely ambulatory  Vitals:   04/23/21 0904  BP: 112/81  Pulse: (!) 114  Resp: 19  Temp: (!) 97.5 F (36.4 C)  SpO2: 98%   Filed Weights   04/23/21 0904  Weight: 175 lb 9.6 oz (79.7 kg)    LABORATORY DATA:  I have reviewed the data as listed CMP Latest Ref Rng & Units 04/02/2021 03/12/2021 01/29/2021  Glucose 70 - 99 mg/dL 88 93 114(H)  BUN 6 - 20 mg/dL $Remove'14 11 13  'mljxtKx$ Creatinine 0.44 - 1.00 mg/dL 0.92 0.87 0.82  Sodium 135 - 145 mmol/L 136 136 138  Potassium 3.5 - 5.1 mmol/L 4.2 4.3 3.9  Chloride 98 - 111 mmol/L 103 101 103  CO2 22 - 32 mmol/L $RemoveB'26 30 25  'hlMCDWTQ$ Calcium 8.9 - 10.3 mg/dL 9.6 9.7 9.4  Total Protein 6.5 - 8.1 g/dL 7.5 7.7 7.9  Total Bilirubin 0.3 - 1.2 mg/dL 0.3 0.3 <0.2(L)  Alkaline Phos 38 - 126 U/L 128(H) 127(H) 172(H)  AST 15 - 41 U/L $Remo'17 21 21  'ZfHZe$ ALT 0 - 44 U/L 25  30 32    Lab Results  Component Value Date   WBC 5.1 04/23/2021   HGB 10.9 (L) 04/23/2021   HCT 34.5 (L) 04/23/2021   MCV 77.9 (L) 04/23/2021   PLT 364 04/23/2021   NEUTROABS 3.3 04/23/2021    ASSESSMENT & PLAN:  Malignant neoplasm of upper-outer quadrant of left breast in female, estrogen receptor negative ([place]) Palpable left breast mass: 1.8 cm UOQ fibroadenoma in the right breast, left breast 3 masses 2 to 2:30 position: 1.7, 1.8, 1.4 cm.  Biopsy revealed IDC grade 3, ER 0%, PR 0%, Ki-67 80%, HER-2 -1+ T1c M0 stage IB   Treatment plan: 1.    Bilateral mastectomies 01/09/2020: Right mastectomy:PASH Left mastectomy: Multifocal IDC grade 3 2.2 cm largest, high-grade DCIS, margins negative, 0/4 sentinel lymph nodes, ER negative, PR negative, HER-2 negative, Ki-67 80% 2.  I recommended adjuvant chemotherapy with dose dense Adriamycin and Cytoxan pembrolizumab followed by Taxol, carboplatin and pembrolizumab  completed 08/28/2020 3.  Maintenance Pembrolizumab completed 03/12/2021 ----------------------------------------------------------------------------------------------------------------------------------- Chemo toxicities: 1.  Peripheral neuropathy: resolved 2. Liver enzyme elevation: Monitoring labs closely  3.  Diffuse body aches and pains: Probably from [place].  On magnesium 4.  Profound fatigue: TSH 0.9 on 10/29/2020. 5.  Microcytic anemia: Iron studies reveal iron deficiency.  Iron infusion is being given with each of her Keytruda treatments.  Hemoglobin is improved to 10.9. No immune mediated adverse effects.   Abdominal pain and muscle and joint aches and pains: 11/30/2020: Liver: 2.1 cm enhancement nonspecific, no definite metastatic disease.  Left iliac bone lytic and sclerotic lesion: Nonspecific, multiple thyroid nodules. Bone lesions: 12/18/20 bone scan: No abnormal activity to correspond to the small sclerotic foci in the left pelvis seen last month Liver MRI 02/05/2021:  Findings compatible with benign focal nodular hyperplasia.  6 to 35-month follow-up recommended    Return to clinic in 3 weeks for follow-up with [facility] for her last treatment. I sent a message to Dr. Barry Dienes to remove the port after that.    No orders of the defined types were placed in this encounter.  The patient has a good understanding of the overall plan. she agrees with it. she will call with any problems that may develop before the next visit here.  Total time spent: 30 mins including face to face time and time spent for planning, charting and coordination of care  Rulon Eisenmenger, MD, MPH 04/23/2021  I, Thana Ates, am acting as scribe for Dr. [name].  I have reviewed the above documentation for accuracy and completeness, and I agree with the above.

## 2021-04-23 ENCOUNTER — Inpatient Hospital Stay: Payer: BC Managed Care – PPO | Attending: Hematology and Oncology

## 2021-04-23 ENCOUNTER — Inpatient Hospital Stay: Payer: BC Managed Care – PPO

## 2021-04-23 ENCOUNTER — Inpatient Hospital Stay (HOSPITAL_BASED_OUTPATIENT_CLINIC_OR_DEPARTMENT_OTHER): Payer: BC Managed Care – PPO | Admitting: Hematology and Oncology

## 2021-04-23 ENCOUNTER — Other Ambulatory Visit: Payer: Self-pay

## 2021-04-23 VITALS — BP 118/76 | HR 86 | Temp 98.7°F | Resp 18

## 2021-04-23 DIAGNOSIS — Z171 Estrogen receptor negative status [ER-]: Secondary | ICD-10-CM | POA: Insufficient documentation

## 2021-04-23 DIAGNOSIS — C50412 Malignant neoplasm of upper-outer quadrant of left female breast: Secondary | ICD-10-CM

## 2021-04-23 DIAGNOSIS — D509 Iron deficiency anemia, unspecified: Secondary | ICD-10-CM | POA: Diagnosis not present

## 2021-04-23 DIAGNOSIS — R7401 Elevation of levels of liver transaminase levels: Secondary | ICD-10-CM | POA: Diagnosis not present

## 2021-04-23 DIAGNOSIS — Z9013 Acquired absence of bilateral breasts and nipples: Secondary | ICD-10-CM | POA: Diagnosis not present

## 2021-04-23 DIAGNOSIS — Z79899 Other long term (current) drug therapy: Secondary | ICD-10-CM | POA: Diagnosis not present

## 2021-04-23 DIAGNOSIS — Z803 Family history of malignant neoplasm of breast: Secondary | ICD-10-CM | POA: Insufficient documentation

## 2021-04-23 DIAGNOSIS — Z95828 Presence of other vascular implants and grafts: Secondary | ICD-10-CM

## 2021-04-23 DIAGNOSIS — Z5112 Encounter for antineoplastic immunotherapy: Secondary | ICD-10-CM | POA: Diagnosis not present

## 2021-04-23 DIAGNOSIS — R5383 Other fatigue: Secondary | ICD-10-CM | POA: Insufficient documentation

## 2021-04-23 DIAGNOSIS — D241 Benign neoplasm of right breast: Secondary | ICD-10-CM | POA: Insufficient documentation

## 2021-04-23 LAB — CMP (CANCER CENTER ONLY)
ALT: 26 U/L (ref 0–44)
AST: 18 U/L (ref 15–41)
Albumin: 4.5 g/dL (ref 3.5–5.0)
Alkaline Phosphatase: 124 U/L (ref 38–126)
Anion gap: 6 (ref 5–15)
BUN: 12 mg/dL (ref 6–20)
CO2: 27 mmol/L (ref 22–32)
Calcium: 9.4 mg/dL (ref 8.9–10.3)
Chloride: 104 mmol/L (ref 98–111)
Creatinine: 0.86 mg/dL (ref 0.44–1.00)
GFR, Estimated: >60 mL/min (ref >60)
Glucose, Bld: 80 mg/dL (ref 70–99)
Potassium: 4.3 mmol/L (ref 3.5–5.1)
Sodium: 137 mmol/L (ref 135–145)
Total Bilirubin: 0.3 mg/dL (ref 0.3–1.2)
Total Protein: 7.4 g/dL (ref 6.5–8.1)

## 2021-04-23 LAB — CBC WITH DIFFERENTIAL (CANCER CENTER ONLY)
Abs Immature Granulocytes: 0.01 10*3/uL (ref 0.00–0.07)
Basophils Absolute: 0.1 10*3/uL (ref 0.0–0.1)
Basophils Relative: 1 %
Eosinophils Absolute: 0.1 10*3/uL (ref 0.0–0.5)
Eosinophils Relative: 2 %
HCT: 34.5 % — ABNORMAL LOW (ref 36.0–46.0)
Hemoglobin: 10.9 g/dL — ABNORMAL LOW (ref 12.0–15.0)
Immature Granulocytes: 0 %
Lymphocytes Relative: 22 %
Lymphs Abs: 1.1 10*3/uL (ref 0.7–4.0)
MCH: 24.6 pg — ABNORMAL LOW (ref 26.0–34.0)
MCHC: 31.6 g/dL (ref 30.0–36.0)
MCV: 77.9 fL — ABNORMAL LOW (ref 80.0–100.0)
Monocytes Absolute: 0.4 10*3/uL (ref 0.1–1.0)
Monocytes Relative: 8 %
Neutro Abs: 3.3 10*3/uL (ref 1.7–7.7)
Neutrophils Relative %: 67 %
Platelet Count: 364 10*3/uL (ref 150–400)
RBC: 4.43 MIL/uL (ref 3.87–5.11)
RDW: 17.8 % — ABNORMAL HIGH (ref 11.5–15.5)
WBC Count: 5.1 10*3/uL (ref 4.0–10.5)
nRBC: 0 % (ref 0.0–0.2)

## 2021-04-23 LAB — T4, FREE: Free T4: 1.44 ng/dL — ABNORMAL HIGH (ref 0.61–1.12)

## 2021-04-23 LAB — TSH: TSH: 0.945 u[IU]/mL (ref 0.308–3.960)

## 2021-04-23 MED ORDER — SODIUM CHLORIDE 0.9 % IV SOLN
300.0000 mg | Freq: Once | INTRAVENOUS | Status: AC
  Administered 2021-04-23: 300 mg via INTRAVENOUS
  Filled 2021-04-23: qty 300

## 2021-04-23 MED ORDER — DIPHENHYDRAMINE HCL 50 MG/ML IJ SOLN
25.0000 mg | Freq: Once | INTRAMUSCULAR | Status: AC
  Administered 2021-04-23: 25 mg via INTRAVENOUS
  Filled 2021-04-23: qty 1

## 2021-04-23 MED ORDER — SODIUM CHLORIDE 0.9% FLUSH
10.0000 mL | INTRAVENOUS | Status: DC | PRN
  Administered 2021-04-23: 10 mL

## 2021-04-23 MED ORDER — SODIUM CHLORIDE 0.9% FLUSH
10.0000 mL | Freq: Once | INTRAVENOUS | Status: AC
  Administered 2021-04-23: 10 mL

## 2021-04-23 MED ORDER — HEPARIN SOD (PORK) LOCK FLUSH 100 UNIT/ML IV SOLN
500.0000 [IU] | Freq: Once | INTRAVENOUS | Status: AC | PRN
  Administered 2021-04-23: 500 [IU]

## 2021-04-23 MED ORDER — SODIUM CHLORIDE 0.9 % IV SOLN: Freq: Once | INTRAVENOUS | Status: AC

## 2021-04-23 MED ORDER — SODIUM CHLORIDE 0.9 % IV SOLN
200.0000 mg | Freq: Once | INTRAVENOUS | Status: AC
  Administered 2021-04-23: 200 mg via INTRAVENOUS
  Filled 2021-04-23: qty 200

## 2021-04-24 ENCOUNTER — Other Ambulatory Visit: Payer: Self-pay | Admitting: General Surgery

## 2021-05-10 ENCOUNTER — Other Ambulatory Visit: Payer: Self-pay

## 2021-05-10 ENCOUNTER — Encounter (HOSPITAL_BASED_OUTPATIENT_CLINIC_OR_DEPARTMENT_OTHER): Payer: Self-pay | Admitting: General Surgery

## 2021-05-12 MED ORDER — CHLORHEXIDINE GLUCONATE CLOTH 2 % EX PADS: 6.0000 | MEDICATED_PAD | Freq: Once | CUTANEOUS | Status: DC

## 2021-05-12 NOTE — Progress Notes (Signed)

## 2021-05-14 ENCOUNTER — Ambulatory Visit: Payer: BC Managed Care – PPO | Admitting: Hematology and Oncology

## 2021-05-14 ENCOUNTER — Inpatient Hospital Stay (HOSPITAL_BASED_OUTPATIENT_CLINIC_OR_DEPARTMENT_OTHER): Payer: BC Managed Care – PPO | Admitting: Adult Health

## 2021-05-14 ENCOUNTER — Inpatient Hospital Stay: Payer: BC Managed Care – PPO

## 2021-05-14 ENCOUNTER — Encounter: Payer: Self-pay | Admitting: *Deleted

## 2021-05-14 ENCOUNTER — Inpatient Hospital Stay: Payer: BC Managed Care – PPO | Attending: Hematology and Oncology

## 2021-05-14 ENCOUNTER — Encounter: Payer: Self-pay | Admitting: Adult Health

## 2021-05-14 ENCOUNTER — Other Ambulatory Visit: Payer: Self-pay

## 2021-05-14 VITALS — BP 127/77 | HR 99 | Temp 97.7°F | Resp 16 | Ht 61.0 in | Wt 176.5 lb

## 2021-05-14 VITALS — BP 126/85 | HR 98 | Temp 98.2°F | Resp 16

## 2021-05-14 DIAGNOSIS — Z171 Estrogen receptor negative status [ER-]: Secondary | ICD-10-CM

## 2021-05-14 DIAGNOSIS — Z5112 Encounter for antineoplastic immunotherapy: Secondary | ICD-10-CM | POA: Diagnosis not present

## 2021-05-14 DIAGNOSIS — C50412 Malignant neoplasm of upper-outer quadrant of left female breast: Secondary | ICD-10-CM | POA: Insufficient documentation

## 2021-05-14 DIAGNOSIS — D241 Benign neoplasm of right breast: Secondary | ICD-10-CM | POA: Insufficient documentation

## 2021-05-14 DIAGNOSIS — Z803 Family history of malignant neoplasm of breast: Secondary | ICD-10-CM | POA: Insufficient documentation

## 2021-05-14 DIAGNOSIS — R5383 Other fatigue: Secondary | ICD-10-CM | POA: Insufficient documentation

## 2021-05-14 DIAGNOSIS — Z79899 Other long term (current) drug therapy: Secondary | ICD-10-CM | POA: Diagnosis not present

## 2021-05-14 DIAGNOSIS — F633 Trichotillomania: Secondary | ICD-10-CM | POA: Insufficient documentation

## 2021-05-14 DIAGNOSIS — D509 Iron deficiency anemia, unspecified: Secondary | ICD-10-CM | POA: Insufficient documentation

## 2021-05-14 DIAGNOSIS — J309 Allergic rhinitis, unspecified: Secondary | ICD-10-CM | POA: Insufficient documentation

## 2021-05-14 DIAGNOSIS — Z95828 Presence of other vascular implants and grafts: Secondary | ICD-10-CM

## 2021-05-14 DIAGNOSIS — B351 Tinea unguium: Secondary | ICD-10-CM | POA: Insufficient documentation

## 2021-05-14 DIAGNOSIS — Z9013 Acquired absence of bilateral breasts and nipples: Secondary | ICD-10-CM | POA: Insufficient documentation

## 2021-05-14 DIAGNOSIS — L709 Acne, unspecified: Secondary | ICD-10-CM | POA: Insufficient documentation

## 2021-05-14 LAB — CBC WITH DIFFERENTIAL (CANCER CENTER ONLY)
Abs Immature Granulocytes: 0.01 10*3/uL (ref 0.00–0.07)
Basophils Absolute: 0 10*3/uL (ref 0.0–0.1)
Basophils Relative: 1 %
Eosinophils Absolute: 0.1 10*3/uL (ref 0.0–0.5)
Eosinophils Relative: 2 %
HCT: 36.7 % (ref 36.0–46.0)
Hemoglobin: 12.2 g/dL (ref 12.0–15.0)
Immature Granulocytes: 0 %
Lymphocytes Relative: 20 %
Lymphs Abs: 1 10*3/uL (ref 0.7–4.0)
MCH: 26 pg (ref 26.0–34.0)
MCHC: 33.2 g/dL (ref 30.0–36.0)
MCV: 78.3 fL — ABNORMAL LOW (ref 80.0–100.0)
Monocytes Absolute: 0.4 10*3/uL (ref 0.1–1.0)
Monocytes Relative: 8 %
Neutro Abs: 3.6 10*3/uL (ref 1.7–7.7)
Neutrophils Relative %: 69 %
Platelet Count: 345 10*3/uL (ref 150–400)
RBC: 4.69 MIL/uL (ref 3.87–5.11)
RDW: 18 % — ABNORMAL HIGH (ref 11.5–15.5)
WBC Count: 5.3 10*3/uL (ref 4.0–10.5)
nRBC: 0 % (ref 0.0–0.2)

## 2021-05-14 LAB — CMP (CANCER CENTER ONLY)
ALT: 31 U/L (ref 0–44)
AST: 20 U/L (ref 15–41)
Albumin: 4.7 g/dL (ref 3.5–5.0)
Alkaline Phosphatase: 102 U/L (ref 38–126)
Anion gap: 6 (ref 5–15)
BUN: 10 mg/dL (ref 6–20)
CO2: 28 mmol/L (ref 22–32)
Calcium: 9.4 mg/dL (ref 8.9–10.3)
Chloride: 103 mmol/L (ref 98–111)
Creatinine: 0.84 mg/dL (ref 0.44–1.00)
GFR, Estimated: >60 mL/min (ref >60)
Glucose, Bld: 93 mg/dL (ref 70–99)
Potassium: 4.1 mmol/L (ref 3.5–5.1)
Sodium: 137 mmol/L (ref 135–145)
Total Bilirubin: 0.3 mg/dL (ref 0.3–1.2)
Total Protein: 7.7 g/dL (ref 6.5–8.1)

## 2021-05-14 LAB — T4, FREE: Free T4: 0.83 ng/dL (ref 0.61–1.12)

## 2021-05-14 LAB — TSH: TSH: 0.755 u[IU]/mL (ref 0.308–3.960)

## 2021-05-14 MED ORDER — SODIUM CHLORIDE 0.9% FLUSH: 10.0000 mL | Freq: Once | INTRAVENOUS | Status: DC

## 2021-05-14 MED ORDER — SODIUM CHLORIDE 0.9 % IV SOLN: Freq: Once | INTRAVENOUS | Status: DC

## 2021-05-14 MED ORDER — SODIUM CHLORIDE 0.9 % IV SOLN
300.0000 mg | Freq: Once | INTRAVENOUS | Status: AC
  Administered 2021-05-14: 300 mg via INTRAVENOUS
  Filled 2021-05-14: qty 300

## 2021-05-14 MED ORDER — SODIUM CHLORIDE 0.9 % IV SOLN
200.0000 mg | Freq: Once | INTRAVENOUS | Status: AC
  Administered 2021-05-14: 200 mg via INTRAVENOUS
  Filled 2021-05-14: qty 8

## 2021-05-14 MED ORDER — ALTEPLASE 2 MG IJ SOLR
2.0000 mg | Freq: Once | INTRAMUSCULAR | Status: AC
  Administered 2021-05-14: 2 mg
  Filled 2021-05-14: qty 2

## 2021-05-14 MED ORDER — SODIUM CHLORIDE 0.9 % IV SOLN: Freq: Once | INTRAVENOUS | Status: AC

## 2021-05-14 MED ORDER — SODIUM CHLORIDE 0.9% FLUSH
10.0000 mL | INTRAVENOUS | Status: DC | PRN
  Administered 2021-05-14: 10 mL

## 2021-05-14 MED ORDER — DIPHENHYDRAMINE HCL 50 MG/ML IJ SOLN
25.0000 mg | Freq: Once | INTRAMUSCULAR | Status: DC
  Filled 2021-05-14: qty 1

## 2021-05-14 MED ORDER — HEPARIN SOD (PORK) LOCK FLUSH 100 UNIT/ML IV SOLN
500.0000 [IU] | Freq: Once | INTRAVENOUS | Status: AC | PRN
  Administered 2021-05-14: 500 [IU]

## 2021-05-14 NOTE — Progress Notes (Signed)
Per Dr Chryl Heck, ok to skip Benadryl today at pt request.  ?

## 2021-05-14 NOTE — Patient Instructions (Signed)
Amanda Burns  Discharge Instructions: ?Thank you for choosing [facility] to provide your oncology and hematology care.  ? ?If you have a lab appointment with the [facility], please go directly to the [facility] and check in at the registration area. ?  ?Wear comfortable clothing and clothing appropriate for easy access to any Portacath or PICC line.  ? ?We strive to give you quality time with your provider. You may need to reschedule your appointment if you arrive late (15 or more minutes).  Arriving late affects you and other patients whose appointments are after yours.  Also, if you miss three or more appointments without notifying the office, you may be dismissed from the clinic at the provider?s discretion.    ?  ?For prescription refill requests, have your pharmacy contact our office and allow 72 hours for refills to be completed.   ? ?Today you received the following chemotherapy and/or immunotherapy agents: Pembrolizumab.     ?  ?To help prevent nausea and vomiting after your treatment, we encourage you to take your nausea medication as directed. ? ?BELOW ARE SYMPTOMS THAT SHOULD BE REPORTED IMMEDIATELY: ?*FEVER GREATER THAN 100.4 F (38 ?C) OR HIGHER ?*CHILLS OR SWEATING ?*NAUSEA AND VOMITING THAT IS NOT CONTROLLED WITH YOUR NAUSEA MEDICATION ?*UNUSUAL SHORTNESS OF BREATH ?*UNUSUAL BRUISING OR BLEEDING ?*URINARY PROBLEMS (pain or burning when urinating, or frequent urination) ?*BOWEL PROBLEMS (unusual diarrhea, constipation, pain near the anus) ?TENDERNESS IN MOUTH AND THROAT WITH OR WITHOUT PRESENCE OF ULCERS (sore throat, sores in mouth, or a toothache) ?UNUSUAL RASH, SWELLING OR PAIN  ?UNUSUAL VAGINAL DISCHARGE OR ITCHING  ? ?Items with * indicate a potential emergency and should be followed up as soon as possible or go to the Emergency Department if any problems should occur. ? ?Please show the CHEMOTHERAPY ALERT CARD or IMMUNOTHERAPY ALERT CARD at  check-in to the Emergency Department and triage nurse. ? ?Should you have questions after your visit or need to cancel or reschedule your appointment, please contact [facility]  Dept: [phone]  and follow the prompts.  Office hours are 8:00 a.m. to 4:30 p.m. Monday - Friday. Please note that voicemails left after 4:00 p.m. may not be returned until the following business day.  We are closed weekends and major holidays. You have access to a nurse at all times for urgent questions. Please call the main number to the clinic Dept: [phone] and follow the prompts. ? ? ?For any non-urgent questions, you may also contact your provider using MyChart. We now offer e-Visits for anyone 22 and older to request care online for non-urgent symptoms. For details visit [email or web address]. ?  ?Also download the MyChart app! Go to the app store, search "MyChart", open the app, select [facility], and log in with your MyChart username and password. ? ?Due to Covid, a mask is required upon entering the hospital/clinic. If you do not have a mask, one will be given to you upon arrival. For doctor visits, patients may have 1 support person aged 72 or older with them. For treatment visits, patients cannot have anyone with them due to current Covid guidelines and our immunocompromised population.  ? ?Iron Sucrose Injection ?What is this medication? ?IRON SUCROSE (EYE ern SOO krose) treats low levels of iron (iron deficiency anemia) in people with kidney disease. Iron is a mineral that plays an important role in making red blood cells, which carry oxygen from your lungs to the  rest of your body. ?This medicine may be used for other purposes; ask your health care provider or pharmacist if you have questions. ?COMMON BRAND NAME(S): Venofer ?What should I tell my care team before I take this medication? ?They need to know if you have any of these conditions: ?Anemia not caused by low iron levels ?Heart  disease ?High levels of iron in the blood ?Kidney disease ?Liver disease ?An unusual or allergic reaction to iron, other medications, foods, dyes, or preservatives ?Pregnant or trying to get pregnant ?Breast-feeding ?How should I use this medication? ?This medication is for infusion into a vein. It is given in a hospital or clinic setting. ?Talk to your care team about the use of this medication in children. While this medication may be prescribed for children as young as 2 years for selected conditions, precautions do apply. ?Overdosage: If you think you have taken too much of this medicine contact a poison control center or emergency room at once. ?NOTE: This medicine is only for you. Do not share this medicine with others. ?What if I miss a dose? ?It is important not to miss your dose. Call your care team if you are unable to keep an appointment. ?What may interact with this medication? ?Do not take this medication with any of the following: ?Deferoxamine ?Dimercaprol ?Other iron products ?This medication may also interact with the following: ?Chloramphenicol ?Deferasirox ?This list may not describe all possible interactions. Give your health care provider a list of all the medicines, herbs, non-prescription drugs, or dietary supplements you use. Also tell them if you smoke, drink alcohol, or use illegal drugs. Some items may interact with your medicine. ?What should I watch for while using this medication? ?Visit your care team regularly. Tell your care team if your symptoms do not start to get better or if they get worse. You may need blood work done while you are taking this medication. ?You may need to follow a special diet. Talk to your care team. Foods that contain iron include: whole grains/cereals, dried fruits, beans, or peas, leafy [place] vegetables, and organ meats (liver, kidney). ?What side effects may I notice from receiving this medication? ?Side effects that you should report to your care team as  soon as possible: ?Allergic reactions--skin rash, itching, hives, swelling of the face, lips, tongue, or throat ?Low blood pressure--dizziness, feeling faint or lightheaded, blurry vision ?Shortness of breath ?Side effects that usually do not require medical attention (report to your care team if they continue or are bothersome): ?Flushing ?Headache ?Joint pain ?Muscle pain ?Nausea ?Pain, redness, or irritation at injection site ?This list may not describe all possible side effects. Call your doctor for medical advice about side effects. You may report side effects to FDA at 1-800-FDA-1088. ?Where should I keep my medication? ?This medication is given in a hospital or clinic and will not be stored at home. ?NOTE: This sheet is a summary. It may not cover all possible information. If you have questions about this medicine, talk to your doctor, pharmacist, or health care provider. ?? 2022 Elsevier/Gold Standard (2020-07-24 00:00:00) ? ?

## 2021-05-14 NOTE — Progress Notes (Signed)
SURVIVORSHIP VISIT:  BRIEF ONCOLOGIC HISTORY:  Oncology History  Malignant neoplasm of upper-outer quadrant of left breast in female, estrogen receptor negative ([place])  11/29/2019 Initial Diagnosis   Patient palpated an area of concern in the left breast. Diagnostic mammogram and US showed a 1.8cm mass in the upper outer right breast representing a fibroadenoma, and in the left breast, three adjacent masses at the 2-2:30 position, 1.7cm, 0.8cm, and 1.4cm, with several cysts and no axillary adenopathy. Biopsy showed a benign fibroadenoma in the right breast, and in the left breast, IDC at the 2:30 position, grade 3, HER-2 negative (1+), ER+ 0%, PR+ 0%, Ki67 80%.    12/04/2019 Cancer Staging   Staging form: Breast, AJCC 8th Edition - Clinical: Stage IB (cT1c, cN0, cM0, G3, ER-, PR-, HER2-) - Signed by [name], MD on 12/04/2019    12/21/2019 Genetic Testing   Negative genetic testing:  No pathogenic variants detected on the [facility] Multi-Cancer Panel. A variant of uncertain significance (VUS) was detected in the CDK4 gene called c.415C>T (p.Arg139*). The report date is 12/21/2019.  The Multi-Cancer Panel offered by [facility] includes sequencing and/or deletion duplication testing of the following 85 genes: AIP, ALK, APC, ATM, AXIN2,BAP1,  BARD1, BLM, BMPR1A, BRCA1, BRCA2, BRIP1, CASR, CDC73, CDH1, CDK4, CDKN1B, CDKN1C, CDKN2A (p14ARF), CDKN2A (p16INK4a), CEBPA, CHEK2, CTNNA1, DICER1, DIS3L2, EGFR (c.2369C>T, p.Thr790Met variant only), EPCAM (Deletion/duplication testing only), FH, FLCN, GATA2, GPC3, GREM1 (Promoter region deletion/duplication testing only), HOXB13 (c.251G>A, p.Gly84Glu), HRAS, KIT, MAX, MEN1, MET, MITF (c.952G>A, p.Glu318Lys variant only), MLH1, MSH2, MSH3, MSH6, MUTYH, NBN, NF1, NF2, NTHL1, PALB2, PDGFRA, PHOX2B, PMS2, POLD1, POLE, POT1, PRKAR1A, PTCH1, PTEN, RAD50, RAD51C, RAD51D, RB1, RECQL4, RET, RNF43, RUNX1, SDHAF2, SDHA (sequence changes only), SDHB, SDHC, SDHD, SMAD4, SMARCA4,  SMARCB1, SMARCE1, STK11, SUFU, TERC, TERT, TMEM127, TP53, TSC1, TSC2, VHL, WRN and WT1.    01/09/2020 Surgery   Bilateral mastectomies with reconstruction Barry Dienes & [place]):  Right breast: no evidence of malignancy Left breast: multifocal invasive ductal carcinoma, grade 3, largest 2.2cm, with high grade DCIS, clear margins, 4 left axillary lymph nodes negative for carcinoma.   03/18/2020 - 08/28/2020 Chemotherapy   Adriamycin, Cytoxan Keytruda followed by Raiford Noble [facility]   09/17/2020 - 04/23/2021 Chemotherapy   Patient is on Treatment Plan : BREAST Pembrolizumab q21d        INTERVAL HISTORY:  Amanda Burns to review her survivorship care plan detailing her treatment course for breast cancer, as well as monitoring long-term side effects of that treatment, education regarding health maintenance, screening, and overall wellness and health promotion.     Overall, Amanda Burns reports feeling quite well.  She is going to receive her last dose of Keytruda today.  She is going to the beach after she receives her infusion because she turns 41 tomorrow.  Overall she is feeling well with the exception of a fear of recurrence she has worried about now that she is finished with her treatments.  REVIEW OF SYSTEMS:  Review of Systems  Constitutional:  Negative for appetite change, chills, fatigue, fever and unexpected weight change.  HENT:   Negative for hearing loss, lump/mass and trouble swallowing.   Eyes:  Negative for eye problems and icterus.  Respiratory:  Negative for chest tightness, cough and shortness of breath.   Cardiovascular:  Negative for chest pain, leg swelling and palpitations.  Gastrointestinal:  Negative for abdominal distention, abdominal pain, constipation, diarrhea, nausea and vomiting.  Endocrine: Negative for hot flashes.  Genitourinary:  Negative for difficulty urinating.  Musculoskeletal:  Negative for arthralgias.  Skin:  Negative for itching and rash.  Neurological:   Negative for dizziness, extremity weakness, headaches and numbness.  Hematological:  Negative for adenopathy. Does not bruise/bleed easily.  Psychiatric/Behavioral:  Negative for depression. The patient is not nervous/anxious.   Breast: Denies any new nodularity, masses, tenderness, nipple changes, or nipple discharge.      ONCOLOGY TREATMENT TEAM:  1. Surgeon:  Dr. Barry Dienes at [facility] 2. Medical Oncologist: [name]  3. Radiation Oncologist: Dr. Sondra Come    PAST MEDICAL/SURGICAL HISTORY:  Past Medical History:  Diagnosis Date   Anxiety    Breast cancer ([place]) 2021   Depression    Family history of adrenal cancer    Family history of breast cancer    Family history of colon cancer    Family history of throat cancer    GERD (gastroesophageal reflux disease) 2015   History of kidney stones 2006   Postpartum care following vaginal delivery (3/28) 06/08/2013   Past Surgical History:  Procedure Laterality Date   BREAST RECONSTRUCTION WITH PLACEMENT OF TISSUE EXPANDER AND FLEX HD (ACELLULAR HYDRATED DERMIS) Bilateral 01/09/2020   Procedure: BREAST RECONSTRUCTION WITH PLACEMENT OF TISSUE EXPANDER AND FLEX HD (ACELLULAR HYDRATED DERMIS);  Surgeon: Irene Limbo, MD;  Location: [place];  Service: Plastics;  Laterality: Bilateral;   FOOT SURGERY     LIPOSUCTION WITH LIPOFILLING Bilateral 01/15/2021   Procedure: LIPOSUCTION BILATERAL CHEST WALL, LIPOFILLING TO BILATERAL CHEST;  Surgeon: Irene Limbo, MD;  Location: [place];  Service: Plastics;  Laterality: Bilateral;   MASTECTOMY W/ SENTINEL NODE BIOPSY Bilateral 01/09/2020   Procedure: BILATERAL MASTECTOMY WITH LEFT SENTINEL LYMPH NODE BIOPSY;  Surgeon: [name], MD;  Location: [place];  Service: General;  Laterality: Bilateral;  PECTORAL BLOCK, RNFA   PORTACATH PLACEMENT Right 01/09/2020   Procedure: INSERTION PORT-A-CATH;  Surgeon: [name], MD;  Location: [facility];  Service: General;   Laterality: Right;   REMOVAL OF BILATERAL TISSUE EXPANDERS WITH PLACEMENT OF BILATERAL BREAST IMPLANTS Bilateral 01/15/2021   Procedure: REMOVAL OF BILATERAL TISSUE EXPANDERS WITH PLACEMENT OF BILATERAL BREAST SILICONE IMPLANTS;  Surgeon: Irene Limbo, MD;  Location: [facility];  Service: Plastics;  Laterality: Bilateral;     ALLERGIES:  Allergies  Allergen Reactions   Benzoyl Peroxide Itching and Swelling   Doxylamine-Phenylephrine Dermatitis   Hydrocodone Itching     CURRENT MEDICATIONS:  Outpatient Encounter Medications as of 05/14/2021  Medication Sig Note   acetaminophen (TYLENOL) 500 MG tablet Take 1,000 mg by mouth every 6 (six) hours as needed for moderate pain or headache.    BIOTIN PO Take by mouth.    buPROPion (WELLBUTRIN XL) 150 MG 24 hr tablet Take 150 mg by mouth daily.    dexmethylphenidate (FOCALIN XR) 10 MG 24 hr capsule Take 10 mg by mouth daily.    escitalopram (LEXAPRO) 20 MG tablet Take 20 mg by mouth daily.    fluticasone (FLONASE) 50 MCG/ACT nasal spray Place 1 spray into both nostrils daily as needed for allergies or rhinitis.    ibuprofen (ADVIL) 200 MG tablet Take 400 mg by mouth every 6 (six) hours as needed for headache or moderate pain.    magnesium oxide (MAG-OX) 400 (240 Mg) MG tablet Take 1 tablet (400 mg total) by mouth daily.    omeprazole (PRILOSEC) 20 MG capsule Take 40 mg by mouth daily. 07/30/2020: Pt taking 40 mg   spironolactone (ALDACTONE) 50 MG tablet Take 50 mg by mouth  2 (two) times daily.    ALPRAZolam (XANAX) 0.5 MG tablet Take 1 tablet (0.5 mg total) by mouth 3 (three) times daily as needed for anxiety. (Patient not taking: Reported on 05/14/2021)    bismuth subsalicylate (PEPTO BISMOL) 262 MG/15ML suspension Take 30 mLs by mouth every 6 (six) hours as needed for indigestion or diarrhea or loose stools. (Patient not taking: Reported on 05/14/2021)    [DISCONTINUED] prochlorperazine (COMPAZINE) 10 MG tablet Take 1 tablet (10 mg  total) by mouth every 6 (six) hours as needed (Nausea or vomiting). 12/06/2019: Have not started   Facility-Administered Encounter Medications as of 05/14/2021  Medication   Chlorhexidine Gluconate Cloth 2 % PADS 6 each   And   Chlorhexidine Gluconate Cloth 2 % PADS 6 each   sodium chloride flush (NS) 0.9 % injection 10 mL   [DISCONTINUED] sodium chloride flush (NS) 0.9 % injection 10 mL     ONCOLOGIC FAMILY HISTORY:  Family History  Problem Relation Age of Onset   Breast cancer Other        maternal great-aunt (MGF's sister), dx in her 79s   Breast cancer Maternal Grandmother 40   Colon cancer Maternal Grandmother 72   Cancer Maternal Grandmother 62       Adrenal cancer   Breast cancer Paternal Aunt        dx. in her 61s   Throat cancer Paternal Aunt        dx. in her 26s, non-smoker     GENETIC COUNSELING/TESTING: See above  SOCIAL HISTORY:  Social History   Socioeconomic History   Marital status: Single    Spouse name: Not on file   Number of children: Not on file   Years of education: Not on file   Highest education level: Not on file  Occupational History   Not on file  Tobacco Use   Smoking status: Never   Smokeless tobacco: Never  Vaping Use   Vaping Use: Never used  Substance and Sexual Activity   Alcohol use: No   Drug use: No   Sexual activity: Yes    Birth control/protection: None  Other Topics Concern   Not on file  Social History Narrative   Not on file   Social Determinants of Health   Financial Resource Strain: Not on file  Food Insecurity: Not on file  Transportation Needs: Not on file  Physical Activity: Not on file  Stress: Not on file  Social Connections: Not on file  Intimate Partner Violence: Not on file     OBSERVATIONS/OBJECTIVE:  BP 127/77 (BP Location: Left Arm, Patient Position: Sitting)    Pulse 99    Temp 97.7 F (36.5 C) (Temporal)    Resp 16    Ht $R'5\' 1"'wU$  (1.549 m)    Wt 176 lb 8 oz (80.1 kg)    SpO2 100%    BMI 33.35  kg/m  GENERAL: Patient is a well appearing female in no acute distress HEENT:  Sclerae anicteric.  Oropharynx clear and moist. No ulcerations or evidence of oropharyngeal candidiasis. Neck is supple.  NODES:  No cervical, supraclavicular, or axillary lymphadenopathy palpated.  BREAST EXAM: Status post bilateral mastectomies with reconstruction no sign of local recurrence. LUNGS:  Clear to auscultation bilaterally.  No wheezes or rhonchi. HEART:  Regular rate and rhythm. No murmur appreciated. ABDOMEN:  Soft, nontender.  Positive, normoactive bowel sounds. No organomegaly palpated. MSK:  No focal spinal tenderness to palpation. Full range of motion bilaterally in the upper  extremities. EXTREMITIES:  No peripheral edema.   SKIN:  Clear with no obvious rashes or skin changes. No nail dyscrasia. NEURO:  Nonfocal. Well oriented.  Appropriate affect.   LABORATORY DATA:  Infusion on 05/14/2021  Component Date Value Ref Range Status   TSH 05/14/2021 0.755  0.308 - 3.960 uIU/mL Final   Performed at [facility], [address], [place], [place] [address]   Sodium 05/14/2021 137  135 - 145 mmol/L Final   Potassium 05/14/2021 4.1  3.5 - 5.1 mmol/L Final   Chloride 05/14/2021 103  98 - 111 mmol/L Final   CO2 05/14/2021 28  22 - 32 mmol/L Final   Glucose, Bld 05/14/2021 93  70 - 99 mg/dL Final   Glucose reference range applies only to samples taken after fasting for at least 8 hours.   BUN 05/14/2021 10  6 - 20 mg/dL Final   Creatinine 05/14/2021 0.84  0.44 - 1.00 mg/dL Final   Calcium 05/14/2021 9.4  8.9 - 10.3 mg/dL Final   Total Protein 05/14/2021 7.7  6.5 - 8.1 g/dL Final   Albumin 05/14/2021 4.7  3.5 - 5.0 g/dL Final   AST 05/14/2021 20  15 - 41 U/L Final   ALT 05/14/2021 31  0 - 44 U/L Final   Alkaline Phosphatase 05/14/2021 102  38 - 126 U/L Final   Total Bilirubin 05/14/2021 0.3  0.3 - 1.2 mg/dL Final   GFR, Estimated 05/14/2021 >60  >60 mL/min Final   Comment:  (NOTE) Calculated using the CKD-EPI Creatinine Equation (2021)    Anion gap 05/14/2021 6  5 - 15 Final   Performed at [facility], [place] [address] [place], [place] [address]   WBC Count 05/14/2021 5.3  4.0 - 10.5 K/uL Final   RBC 05/14/2021 4.69  3.87 - 5.11 MIL/uL Final   Hemoglobin 05/14/2021 12.2  12.0 - 15.0 g/dL Final   HCT 05/14/2021 36.7  36.0 - 46.0 % Final   MCV 05/14/2021 78.3 (L)  80.0 - 100.0 fL Final   MCH 05/14/2021 26.0  26.0 - 34.0 pg Final   MCHC 05/14/2021 33.2  30.0 - 36.0 g/dL Final   RDW 05/14/2021 18.0 (H)  11.5 - 15.5 % Final   Platelet Count 05/14/2021 345  150 - 400 K/uL Final   nRBC 05/14/2021 0.0  0.0 - 0.2 % Final   Neutrophils Relative % 05/14/2021 69  % Final   Neutro Abs 05/14/2021 3.6  1.7 - 7.7 K/uL Final   Lymphocytes Relative 05/14/2021 20  % Final   Lymphs Abs 05/14/2021 1.0  0.7 - 4.0 K/uL Final   Monocytes Relative 05/14/2021 8  % Final   Monocytes Absolute 05/14/2021 0.4  0.1 - 1.0 K/uL Final   Eosinophils Relative 05/14/2021 2  % Final   Eosinophils Absolute 05/14/2021 0.1  0.0 - 0.5 K/uL Final   Basophils Relative 05/14/2021 1  % Final   Basophils Absolute 05/14/2021 0.0  0.0 - 0.1 K/uL Final   Immature Granulocytes 05/14/2021 0  % Final   Abs Immature Granulocytes 05/14/2021 0.01  0.00 - 0.07 K/uL Final   Performed at [facility], [place] [address] [place], [place] [address]     DIAGNOSTIC IMAGING:  None for this visit.      ASSESSMENT AND PLAN:  Ms.. Burns is a pleasant 41 y.o. female with Stage IB left breast invasive ductal carcinoma, ER-/PR-/HER2-, diagnosed in September 2021, treated with bilateral mastectomies, adjuvant  chemotherapy, and maintenance pembrolizumab..  She presents to the Survivorship Clinic for our initial meeting and routine follow-up post-completion of treatment for breast cancer.    1. Stage IB left breast cancer:  Amanda Burns is continuing to recover from  definitive treatment for breast cancer. She will follow-up with her medical oncologist, [name] in 6 months with history and physical exam per surveillance protocol.  She is seeing Dr. Barry Dienes next week for port removal.  Today, a comprehensive survivorship care plan and treatment summary was reviewed with the patient today detailing her breast cancer diagnosis, treatment course, potential late/long-term effects of treatment, appropriate follow-up care with recommendations for the future, and patient education resources.  A copy of this summary, along with a letter will be sent to the patients primary care provider via mail/fax/In Basket message after todays visit.    2. Bone health:  She was given education on specific activities to promote bone health.  3. Cancer screening:  Due to Amanda Burns's history and her age, she should receive screening for skin cancers and gynecologic cancers.  The information and recommendations are listed on the patient's comprehensive care plan/treatment summary and were reviewed in detail with the patient.    4. Health maintenance and wellness promotion: Amanda Burns was encouraged to consume 5-7 servings of fruits and vegetables per day. We reviewed the "Nutrition Rainbow" handout.  She was also encouraged to engage in moderate to vigorous exercise for 30 minutes per day most days of the week. We discussed the [employer] fitness program, which is designed for cancer survivors to help them become more physically fit after cancer treatments.  She was instructed to limit her alcohol consumption and continue to abstain from tobacco use.     5. Support services/counseling: It is not uncommon for this period of the patient's cancer care trajectory to be one of many emotions and stressors.  She was given information regarding our available services and encouraged to contact me with any questions or for help enrolling in any of our support group/programs.    Follow up  instructions:    -Return to cancer center in 6 months for follow-up -Follow up with surgery next week for port removal then thereafter per Dr. Barry Dienes -She is welcome to return back to the Survivorship Clinic at any time; no additional follow-up needed at this time.  -Consider referral back to survivorship as a long-term survivor for continued surveillance  The patient was provided an opportunity to ask questions and all were answered. The patient agreed with the plan and demonstrated an understanding of the instructions.   Total encounter time: 30 minutes in face-to-face visit time, chart review, lab review, care coordination, and documentation of the encounter.  Wilber Bihari, NP 05/14/21 11:35 AM Medical Oncology and Hematology [facility] [place], [place] [address] Tel. [phone]    Fax. [phone]  *Total Encounter Time as defined by the Centers for Medicare and Medicaid Services includes, in addition to the face-to-face time of a patient visit (documented in the note above) non-face-to-face time: obtaining and reviewing outside history, ordering and reviewing medications, tests or procedures, care coordination (communications with other health care professionals or caregivers) and documentation in the medical record.

## 2021-05-14 NOTE — Progress Notes (Signed)
Per [place] in pharmacy, ok to proceed with Venofer infusion prior to [facility]. ?

## 2021-05-17 ENCOUNTER — Telehealth: Payer: Self-pay | Admitting: Adult Health

## 2021-05-17 NOTE — Telephone Encounter (Signed)
Scheduled appointment per 3/3 los. Patient is aware of appointment. ?

## 2021-05-19 ENCOUNTER — Telehealth: Payer: Self-pay

## 2021-05-19 ENCOUNTER — Other Ambulatory Visit: Payer: Self-pay

## 2021-05-19 ENCOUNTER — Encounter (HOSPITAL_BASED_OUTPATIENT_CLINIC_OR_DEPARTMENT_OTHER): Payer: Self-pay | Admitting: General Surgery

## 2021-05-19 ENCOUNTER — Encounter (HOSPITAL_BASED_OUTPATIENT_CLINIC_OR_DEPARTMENT_OTHER)
Admission: RE | Disposition: A | Discharge status: Home / Self Care | Payer: Self-pay | Source: Home / Self Care | Attending: General Surgery

## 2021-05-19 ENCOUNTER — Ambulatory Visit (HOSPITAL_BASED_OUTPATIENT_CLINIC_OR_DEPARTMENT_OTHER)
Admission: RE | Admit: 2021-05-19 | Discharge: 2021-05-19 | Disposition: A | Discharge status: Home / Self Care | Payer: BC Managed Care – PPO | Attending: General Surgery | Admitting: General Surgery

## 2021-05-19 ENCOUNTER — Ambulatory Visit (HOSPITAL_BASED_OUTPATIENT_CLINIC_OR_DEPARTMENT_OTHER): Payer: BC Managed Care – PPO | Admitting: Anesthesiology

## 2021-05-19 DIAGNOSIS — Z452 Encounter for adjustment and management of vascular access device: Secondary | ICD-10-CM | POA: Insufficient documentation

## 2021-05-19 DIAGNOSIS — Z9221 Personal history of antineoplastic chemotherapy: Secondary | ICD-10-CM | POA: Insufficient documentation

## 2021-05-19 DIAGNOSIS — I10 Essential (primary) hypertension: Secondary | ICD-10-CM | POA: Diagnosis not present

## 2021-05-19 DIAGNOSIS — K219 Gastro-esophageal reflux disease without esophagitis: Secondary | ICD-10-CM | POA: Insufficient documentation

## 2021-05-19 DIAGNOSIS — F32A Depression, unspecified: Secondary | ICD-10-CM | POA: Insufficient documentation

## 2021-05-19 DIAGNOSIS — F419 Anxiety disorder, unspecified: Secondary | ICD-10-CM | POA: Diagnosis not present

## 2021-05-19 DIAGNOSIS — Z853 Personal history of malignant neoplasm of breast: Secondary | ICD-10-CM | POA: Insufficient documentation

## 2021-05-19 HISTORY — PX: PORT-A-CATH REMOVAL: SHX5289

## 2021-05-19 LAB — POCT PREGNANCY, URINE: Preg Test, Ur: NEGATIVE

## 2021-05-19 SURGERY — REMOVAL PORT-A-CATH
Anesthesia: Monitor Anesthesia Care | Site: Chest

## 2021-05-19 MED ORDER — MIDAZOLAM HCL 5 MG/5ML IJ SOLN
INTRAMUSCULAR | Status: DC | PRN
  Administered 2021-05-19: 2 mg via INTRAVENOUS

## 2021-05-19 MED ORDER — LACTATED RINGERS IV SOLN: INTRAVENOUS | Status: DC

## 2021-05-19 MED ORDER — ACETAMINOPHEN 500 MG PO TABS
1000.0000 mg | ORAL_TABLET | ORAL | Status: AC
  Administered 2021-05-19: 1000 mg via ORAL

## 2021-05-19 MED ORDER — PROPOFOL 10 MG/ML IV BOLUS
INTRAVENOUS | Status: DC | PRN
  Administered 2021-05-19 (×2): 40 mg via INTRAVENOUS

## 2021-05-19 MED ORDER — ACETAMINOPHEN 500 MG PO TABS
ORAL_TABLET | ORAL | Status: AC
  Filled 2021-05-19: qty 2

## 2021-05-19 MED ORDER — CEFAZOLIN SODIUM-DEXTROSE 2-4 GM/100ML-% IV SOLN
2.0000 g | INTRAVENOUS | Status: AC
  Administered 2021-05-19: 2 g via INTRAVENOUS

## 2021-05-19 MED ORDER — AMISULPRIDE (ANTIEMETIC) 5 MG/2ML IV SOLN: 10.0000 mg | Freq: Once | INTRAVENOUS | Status: DC | PRN

## 2021-05-19 MED ORDER — CEFAZOLIN SODIUM-DEXTROSE 2-4 GM/100ML-% IV SOLN
INTRAVENOUS | Status: AC
  Filled 2021-05-19: qty 100

## 2021-05-19 MED ORDER — FENTANYL CITRATE (PF) 100 MCG/2ML IJ SOLN: 25.0000 ug | INTRAMUSCULAR | Status: DC | PRN

## 2021-05-19 MED ORDER — FENTANYL CITRATE (PF) 100 MCG/2ML IJ SOLN
INTRAMUSCULAR | Status: DC | PRN
  Administered 2021-05-19: 100 ug via INTRAVENOUS

## 2021-05-19 MED ORDER — KETOROLAC TROMETHAMINE 30 MG/ML IJ SOLN: 30.0000 mg | Freq: Once | INTRAMUSCULAR | Status: DC | PRN

## 2021-05-19 MED ORDER — OXYCODONE HCL 5 MG PO TABS: 5.0000 mg | ORAL_TABLET | Freq: Four times a day (QID) | ORAL | 0 refills | Status: DC | PRN

## 2021-05-19 MED ORDER — BUPIVACAINE-EPINEPHRINE (PF) 0.25% -1:200000 IJ SOLN
INTRAMUSCULAR | Status: DC | PRN
Start: 2021-05-19 — End: 2021-05-19
  Administered 2021-05-19: 10 mL via INTRAMUSCULAR

## 2021-05-19 MED ORDER — ONDANSETRON HCL 4 MG/2ML IJ SOLN
INTRAMUSCULAR | Status: DC | PRN
  Administered 2021-05-19: 4 mg via INTRAVENOUS

## 2021-05-19 SURGICAL SUPPLY — 31 items
ADH SKN CLS APL DERMABOND .7 (GAUZE/BANDAGES/DRESSINGS) ×1
APL PRP STRL LF DISP 70% ISPRP (MISCELLANEOUS) ×1
BLADE HEX COATED 2.75 (ELECTRODE) ×2 IMPLANT
BLADE SURG 15 STRL LF DISP TIS (BLADE) ×1 IMPLANT
BLADE SURG 15 STRL SS (BLADE) ×2
CHLORAPREP W/TINT 26 (MISCELLANEOUS) ×2 IMPLANT
COVER BACK TABLE 60X90IN (DRAPES) ×2 IMPLANT
COVER MAYO STAND STRL (DRAPES) ×2 IMPLANT
DERMABOND ADVANCED (GAUZE/BANDAGES/DRESSINGS) ×1
DERMABOND ADVANCED .7 DNX12 (GAUZE/BANDAGES/DRESSINGS) ×1 IMPLANT
DRAPE LAPAROTOMY 100X72 PEDS (DRAPES) ×2 IMPLANT
DRAPE UTILITY XL STRL (DRAPES) ×2 IMPLANT
ELECT REM PT RETURN 9FT ADLT (ELECTROSURGICAL) ×2
ELECTRODE REM PT RTRN 9FT ADLT (ELECTROSURGICAL) ×1 IMPLANT
GLOVE SURG ENC MOIS LTX SZ6 (GLOVE) ×2 IMPLANT
GLOVE SURG POLYISO LF SZ6.5 (GLOVE) ×1 IMPLANT
GLOVE SURG POLYISO LF SZ7 (GLOVE) ×1 IMPLANT
GLOVE SURG UNDER POLY LF SZ6.5 (GLOVE) ×3 IMPLANT
GLOVE SURG UNDER POLY LF SZ7 (GLOVE) ×1 IMPLANT
GOWN STRL REUS W/ TWL LRG LVL3 (GOWN DISPOSABLE) ×1 IMPLANT
GOWN STRL REUS W/TWL 2XL LVL3 (GOWN DISPOSABLE) ×2 IMPLANT
GOWN STRL REUS W/TWL LRG LVL3 (GOWN DISPOSABLE) ×4
NDL HYPO 25X1 1.5 SAFETY (NEEDLE) ×1 IMPLANT
NEEDLE HYPO 25X1 1.5 SAFETY (NEEDLE) ×2 IMPLANT
PACK BASIN DAY SURGERY FS (CUSTOM PROCEDURE TRAY) ×2 IMPLANT
PENCIL SMOKE EVACUATOR (MISCELLANEOUS) ×2 IMPLANT
SUT MNCRL AB 4-0 PS2 18 (SUTURE) ×2 IMPLANT
SUT VIC AB 3-0 SH 27 (SUTURE) ×2
SUT VIC AB 3-0 SH 27X BRD (SUTURE) ×1 IMPLANT
SYR CONTROL 10ML LL (SYRINGE) ×2 IMPLANT
TOWEL GREEN STERILE FF (TOWEL DISPOSABLE) ×2 IMPLANT

## 2021-05-19 NOTE — Anesthesia Postprocedure Evaluation (Signed)
Anesthesia Post Note ? ?Patient: BRITTLYN CLOE ? ?Procedure(s) Performed: REMOVAL PORT-A-CATH (Chest) ? ?  ? ?Patient location during evaluation: PACU ?Anesthesia Type: MAC ?Level of consciousness: awake ?Pain management: pain level controlled ?Vital Signs Assessment: post-procedure vital signs reviewed and stable ?Respiratory status: spontaneous breathing, nonlabored ventilation, respiratory function stable and patient connected to nasal cannula oxygen ?Cardiovascular status: stable and blood pressure returned to baseline ?Postop Assessment: no apparent nausea or vomiting ?Anesthetic complications: no ? ? ?No notable events documented. ? ?Last Vitals:  ?Vitals:  ? 05/19/21 1415 05/19/21 1438  ?BP: 99/67 105/71  ?Pulse: 88 78  ?Resp: 16 16  ?Temp:  36.6 ?C  ?SpO2: 99% 97%  ?  ?Last Pain:  ?Vitals:  ? 05/19/21 1438  ?TempSrc:   ?PainSc: 0-No pain  ? ? ?  ?  ?  ?  ?  ?  ? ?Briley Sulton P Asaad Gulley ? ? ? ? ?

## 2021-05-19 NOTE — H&P (Signed)
Amanda Burns is an 41 y.o. female.   ?Chief Complaint: left breast cancer ?HPI:  ?Pt is a 41 yo F s/p treatment for left breast cancer.  She has completed chemo and is NED.  Desires port removal. ? ?Past Medical History:  ?Diagnosis Date  ? Anxiety   ? Breast cancer ([place]) 2021  ? Depression   ? Essential hypertension 07/26/2019  ? Family history of adrenal cancer   ? Family history of breast cancer   ? Family history of colon cancer   ? Family history of throat cancer   ? GERD (gastroesophageal reflux disease) 2015  ? History of kidney stones 2006  ? Postpartum care following vaginal delivery (3/28) 06/08/2013  ? ? ?Past Surgical History:  ?Procedure Laterality Date  ? BREAST RECONSTRUCTION WITH PLACEMENT OF TISSUE EXPANDER AND FLEX HD (ACELLULAR HYDRATED DERMIS) Bilateral 01/09/2020  ? Procedure: BREAST RECONSTRUCTION WITH PLACEMENT OF TISSUE EXPANDER AND FLEX HD (ACELLULAR HYDRATED DERMIS);  Surgeon: Irene Limbo, MD;  Location: [facility];  Service: Plastics;  Laterality: Bilateral;  ? FOOT SURGERY    ? LIPOSUCTION WITH LIPOFILLING Bilateral 01/15/2021  ? Procedure: LIPOSUCTION BILATERAL CHEST WALL, LIPOFILLING TO BILATERAL CHEST;  Surgeon: Irene Limbo, MD;  Location: [facility];  Service: Plastics;  Laterality: Bilateral;  ? MASTECTOMY W/ SENTINEL NODE BIOPSY Bilateral 01/09/2020  ? Procedure: BILATERAL MASTECTOMY WITH LEFT SENTINEL LYMPH NODE BIOPSY;  Surgeon: [name], MD;  Location: [place];  Service: General;  Laterality: Bilateral;  PECTORAL BLOCK, RNFA  ? PORTACATH PLACEMENT Right 01/09/2020  ? Procedure: INSERTION PORT-A-CATH;  Surgeon: [name], MD;  Location: [place];  Service: General;  Laterality: Right;  ? REMOVAL OF BILATERAL TISSUE EXPANDERS WITH PLACEMENT OF BILATERAL BREAST IMPLANTS Bilateral 01/15/2021  ? Procedure: REMOVAL OF BILATERAL TISSUE EXPANDERS WITH PLACEMENT OF BILATERAL BREAST SILICONE IMPLANTS;  Surgeon: Irene Limbo, MD;  Location: [place];  Service: Plastics;  Laterality: Bilateral;  ? ? ?Family History  ?Problem Relation Age of Onset  ? Breast cancer Other   ?     maternal great-aunt (MGF's sister), dx in her 19s  ? Breast cancer Maternal Grandmother 74  ? Colon cancer Maternal Grandmother 4  ? Cancer Maternal Grandmother 46  ?     Adrenal cancer  ? Breast cancer Paternal Aunt   ?     dx. in her 70s  ? Throat cancer Paternal Aunt   ?     dx. in her 2s, non-smoker  ? ?Social History:  reports that she has never smoked. She has never used smokeless tobacco. She reports that she does not drink alcohol and does not use drugs. ? ?Allergies:  ?Allergies  ?Allergen Reactions  ? Benzoyl Peroxide Itching and Swelling  ? Doxylamine-Phenylephrine Dermatitis  ? Hydrocodone Itching  ? ? ?Medications Prior to Admission  ?Medication Sig Dispense Refill  ? acetaminophen (TYLENOL) 500 MG tablet Take 1,000 mg by mouth every 6 (six) hours as needed for moderate pain or headache.    ? ALPRAZolam (XANAX) 0.5 MG tablet Take 1 tablet (0.5 mg total) by mouth 3 (three) times daily as needed for anxiety. (Patient not taking: Reported on 05/14/2021) 60 tablet 1  ? BIOTIN PO Take by mouth.    ? bismuth subsalicylate (PEPTO BISMOL) 262 MG/15ML suspension Take 30 mLs by mouth every 6 (six) hours as needed for indigestion or diarrhea or loose stools. (Patient not taking: Reported on 05/14/2021)    ? buPROPion (WELLBUTRIN XL) 150 MG  24 hr tablet Take 150 mg by mouth daily.    ? dexmethylphenidate (FOCALIN XR) 10 MG 24 hr capsule Take 10 mg by mouth daily.    ? escitalopram (LEXAPRO) 20 MG tablet Take 20 mg by mouth daily.    ? fluticasone (FLONASE) 50 MCG/ACT nasal spray Place 1 spray into both nostrils daily as needed for allergies or rhinitis.    ? ibuprofen (ADVIL) 200 MG tablet Take 400 mg by mouth every 6 (six) hours as needed for headache or moderate pain.    ? magnesium oxide (MAG-OX) 400 (240 Mg) MG tablet Take 1 tablet (400 mg total) by mouth daily. 30 tablet 6  ?  omeprazole (PRILOSEC) 20 MG capsule Take 40 mg by mouth daily.    ? spironolactone (ALDACTONE) 50 MG tablet Take 50 mg by mouth 2 (two) times daily.    ? ? ?Results for orders placed or performed during the hospital encounter of 05/19/21 (from the past 48 hour(s))  ?Pregnancy, urine POC     Status: None  ? Collection Time: 05/19/21 11:46 AM  ?Result Value Ref Range  ? Preg Test, Ur NEGATIVE NEGATIVE  ?  Comment:        ?THE SENSITIVITY OF THIS ?METHODOLOGY IS >24 mIU/mL ?  ? ?No results found. ? ?Review of Systems  ?All other systems reviewed and are negative. ? ?Blood pressure 108/75, pulse (!) 103, temperature 99.1 ?F (37.3 ?C), temperature source Oral, resp. rate 16, height '5\' 1"'$  (1.549 m), weight 78.6 kg, last menstrual period 04/06/2021, SpO2 100 %, not currently breastfeeding. ?Physical Exam ?Vitals reviewed.  ?Constitutional:   ?   Appearance: Normal appearance.  ?HENT:  ?   Head: Normocephalic and atraumatic.  ?   Mouth/Throat:  ?   Mouth: Mucous membranes are moist.  ?Eyes:  ?   General: No scleral icterus. ?   Pupils: Pupils are equal, round, and reactive to light.  ?Cardiovascular:  ?   Rate and Rhythm: Normal rate.  ?Pulmonary:  ?   Effort: Pulmonary effort is normal.  ?   Comments: Right port in place ?Abdominal:  ?   General: Abdomen is flat.  ?Musculoskeletal:     ?   General: Normal range of motion.  ?Skin: ?   General: Skin is warm and dry.  ?   Capillary Refill: Capillary refill takes 2 to 3 seconds.  ?Neurological:  ?   General: No focal deficit present.  ?   Mental Status: She is alert and oriented to person, place, and time.  ?Psychiatric:     ?   Mood and Affect: Mood normal.     ?   Behavior: Behavior normal.     ?   Thought Content: Thought content normal.     ?   Judgment: Judgment normal.  ?  ? ?Assessment/Plan ?H/o left breast cancer ?Plan port removal ?Discussed surgery and risks. ?Pt desires to proceed.  ? ?[name], MD ?05/19/2021, 1:06 PM ? ? ? ?

## 2021-05-19 NOTE — Transfer of Care (Signed)
Immediate Anesthesia Transfer of Care Note ? ?Patient: Amanda Burns ? ?Procedure(s) Performed: REMOVAL PORT-A-CATH (Chest) ? ?Patient Location: PACU ? ?Anesthesia Type:MAC ? ?Level of Consciousness: awake, alert  and oriented ? ?Airway & Oxygen Therapy: Patient Spontanous Breathing and Patient connected to face mask oxygen ? ?Post-op Assessment: Report given to RN and Post -op Vital signs reviewed and stable ? ?Post vital signs: Reviewed and stable ? ?Last Vitals:  ?Vitals Value Taken Time  ?BP    ?Temp    ?Pulse 93 05/19/21 1354  ?Resp    ?SpO2 99 % 05/19/21 1354  ?Vitals shown include unvalidated device data. ? ?Last Pain:  ?Vitals:  ? 05/19/21 1206  ?TempSrc: Oral  ?PainSc: 0-No pain  ?   ? ?  ? ?Complications: No notable events documented. ?

## 2021-05-19 NOTE — Op Note (Signed)
?  PRE-OPERATIVE DIAGNOSIS:  un-needed Port-A-Cath for left breast cancer ? ?POST-OPERATIVE DIAGNOSIS:  Same  ? ?PROCEDURE:  Procedure(s):  REMOVAL PORT-A-CATH ? ?SURGEON:  Surgeon(s):  [name], MD ? ?ANESTHESIA:   MAC + local ? ?EBL:   Minimal ? ?SPECIMEN:  None ? ?Complications : none known ? ?Procedure:   ?Pt was  identified in the holding area and taken to the operating room where she was placed supine on the operating room table.  MAC anesthesia was induced.  The right upper chest was prepped and draped.  The prior incision was anesthetized with local anesthetic.  The incision was opened with a #15 blade.  The subcutaneous tissue was divided with the cautery.  The port was identified and the capsule opened.  The four 2-0 prolene sutures were removed.  The port was then removed and pressure held on the tract.  The catheter appeared intact without evidence of breakage, length was 14.5 cm.  The wound was inspected for hemostasis, which was achieved with cautery.  The wound was closed with 3-0 vicryl deep dermal interrupted sutures and 4-0 Monocryl running subcuticular suture.  The wound was cleaned, dried, and dressed with dermabond.  The patient was awakened from anesthesia and taken to the PACU in stable condition.  Needle, sponge, and instrument counts are correct.   ? ?

## 2021-05-19 NOTE — Telephone Encounter (Signed)
Notified Patient of completion of FMLA Certification papers. Fax transmission confirmation received. Copy of papers mailed to Patient as requested. No other needs or concerns voiced at this time. ?

## 2021-05-19 NOTE — Anesthesia Preprocedure Evaluation (Addendum)
Anesthesia Evaluation  ?Patient identified by MRN, date of birth, ID band ?Patient awake ? ? ? ?Reviewed: ?Allergy & Precautions, NPO status , Patient's Chart, lab work & pertinent test results ? ?Airway ?Mallampati: II ? ?TM Distance: >3 FB ?Neck ROM: Full ? ? ? Dental ?no notable dental hx. ? ?  ?Pulmonary ?neg pulmonary ROS,  ?  ?Pulmonary exam normal ?breath sounds clear to auscultation ? ? ? ? ? ? Cardiovascular ?hypertension, Normal cardiovascular exam ?Rhythm:Regular Rate:Normal ? ? ?  ?Neuro/Psych ?PSYCHIATRIC DISORDERS Anxiety Depression negative neurological ROS ?   ? GI/Hepatic ?Neg liver ROS, GERD  Medicated and Controlled,  ?Endo/Other  ?Breast cancer ? Renal/GU ?negative Renal ROS  ? ?  ?Musculoskeletal ?negative musculoskeletal ROS ?(+)  ? Abdominal ?(+) + obese,   ?Peds ? Hematology ?negative hematology ROS ?(+)   ?Anesthesia Other Findings ?PORT IN PLACE ? Reproductive/Obstetrics ?hcg negative ? ?  ? ? ? ? ? ? ? ? ? ? ? ? ? ?  ?  ? ? ? ? ? ? ? ?Anesthesia Physical ?Anesthesia Plan ? ?ASA: 2 ? ?Anesthesia Plan: MAC  ? ?Post-op Pain Management:   ? ?Induction: Intravenous ? ?PONV Risk Score and Plan: 2 and Ondansetron, Dexamethasone, Propofol infusion, Midazolam and Treatment may vary due to age or medical condition ? ?Airway Management Planned: Simple Face Mask ? ?Additional Equipment:  ? ?Intra-op Plan:  ? ?Post-operative Plan:  ? ?Informed Consent: I have reviewed the patients History and Physical, chart, labs and discussed the procedure including the risks, benefits and alternatives for the proposed anesthesia with the patient or authorized representative who has indicated his/her understanding and acceptance.  ? ? ? ?Dental advisory given ? ?Plan Discussed with: CRNA ? ?Anesthesia Plan Comments:   ? ? ? ? ? ? ?Anesthesia Quick Evaluation ? ?

## 2021-05-19 NOTE — Discharge Instructions (Addendum)
[facility] ?Office Phone Number [phone] ? ? POST OP INSTRUCTIONS ? ?Always review your discharge instruction sheet given to you by the facility where your surgery was performed. ? ?IF YOU HAVE DISABILITY OR FAMILY LEAVE FORMS, YOU MUST BRING THEM TO THE OFFICE FOR PROCESSING.  DO NOT GIVE THEM TO YOUR DOCTOR. ? ?A prescription for pain medication may be given to you upon discharge.  Take your pain medication as prescribed, if needed.  If narcotic pain medicine is not needed, then you may take acetaminophen (Tylenol) or ibuprofen (Advil) as needed. ?Take your usually prescribed medications unless otherwise directed ?If you need a refill on your pain medication, please contact your pharmacy.  They will contact our office to request authorization.  Prescriptions will not be filled after 5pm or on week-ends. ?You should eat very light the first 24 hours after surgery, such as soup, crackers, pudding, etc.  Resume your normal diet the day after surgery ?It is common to experience some constipation if taking pain medication after surgery.  Increasing fluid intake and taking a stool softener will usually help or prevent this problem from occurring.  A mild laxative (Milk of Magnesia or Miralax) should be taken according to package directions if there are no bowel movements after 48 hours. ?You may shower in 48 hours.  The surgical glue will flake off in 2-3 weeks.   ?ACTIVITIES:  No strenuous activity or heavy lifting for 1 week.   ?You may drive when you no longer are taking prescription pain medication, you can comfortably wear a seatbelt, and you can safely maneuver your car and apply brakes. ?RETURN TO WORK:  __________as tolerated or 1-2 weeks if no lifting for at least 1 week_______________ ?You should see your doctor in the office for a follow-up appointment approximately three-four weeks after your surgery.   ? ?WHEN TO CALL YOUR DOCTOR: ?Fever over 101.0 ?Nausea and/or vomiting. ?Extreme  swelling or bruising. ?Continued bleeding from incision. ?Increased pain, redness, or drainage from the incision. ? ?The clinic staff is available to answer your questions during regular business hours.  Please don?t hesitate to call and ask to speak to one of the nurses for clinical concerns.  If you have a medical emergency, go to the nearest emergency room or call 911.  A surgeon from [facility] is always on call at the hospital. ? ?For further questions, please visit [email or web address]  ? ? ?Post Anesthesia Home Care Instructions ? ?Activity: ?Get plenty of rest for the remainder of the day. A responsible individual must stay with you for 24 hours following the procedure.  ?For the next 24 hours, DO NOT: ?-Drive a car ?-Paediatric nurse ?-Drink alcoholic beverages ?-Take any medication unless instructed by your physician ?-Make any legal decisions or sign important papers. ? ?Meals: ?Start with liquid foods such as gelatin or soup. Progress to regular foods as tolerated. Avoid greasy, spicy, heavy foods. If nausea and/or vomiting occur, drink only clear liquids until the nausea and/or vomiting subsides. Call your physician if vomiting continues. ? ?Special Instructions/Symptoms: ?Your throat may feel dry or sore from the anesthesia or the breathing tube placed in your throat during surgery. If this causes discomfort, gargle with warm salt water. The discomfort should disappear within 24 hours. ? ?If you had a scopolamine patch placed behind your ear for the management of post- operative nausea and/or vomiting: ? ?1. The medication in the patch is effective for 72 hours, after which it should be removed.  Wrap patch in a tissue and discard in the trash. Wash hands thoroughly with soap and water. ?2. You may remove the patch earlier than 72 hours if you experience unpleasant side effects which may include dry mouth, dizziness or visual disturbances. ?3. Avoid touching the patch. Wash your  hands with soap and water after contact with the patch. ?   ? ? ?

## 2021-05-20 ENCOUNTER — Encounter (HOSPITAL_BASED_OUTPATIENT_CLINIC_OR_DEPARTMENT_OTHER): Payer: Self-pay | Admitting: General Surgery

## 2021-06-03 ENCOUNTER — Ambulatory Visit (INDEPENDENT_AMBULATORY_CARE_PROVIDER_SITE_OTHER): Payer: BC Managed Care – PPO | Admitting: Obstetrics & Gynecology

## 2021-06-03 ENCOUNTER — Other Ambulatory Visit: Payer: Self-pay

## 2021-06-03 ENCOUNTER — Encounter: Payer: Self-pay | Admitting: Obstetrics & Gynecology

## 2021-06-03 ENCOUNTER — Other Ambulatory Visit (HOSPITAL_COMMUNITY)
Admission: RE | Admit: 2021-06-03 | Discharge: 2021-06-03 | Disposition: A | Discharge status: Home / Self Care | Payer: BC Managed Care – PPO | Source: Ambulatory Visit | Attending: Obstetrics & Gynecology | Admitting: Obstetrics & Gynecology

## 2021-06-03 VITALS — BP 113/71 | HR 97 | Ht 61.0 in | Wt 176.2 lb

## 2021-06-03 DIAGNOSIS — Z01419 Encounter for gynecological examination (general) (routine) without abnormal findings: Secondary | ICD-10-CM

## 2021-06-03 DIAGNOSIS — B379 Candidiasis, unspecified: Secondary | ICD-10-CM | POA: Diagnosis not present

## 2021-06-03 MED ORDER — FLUCONAZOLE 150 MG PO TABS
150.0000 mg | ORAL_TABLET | Freq: Once | ORAL | 2 refills | Status: AC
Start: 2021-06-03 — End: 2021-06-03

## 2021-06-03 NOTE — Progress Notes (Signed)
? ?WELL-WOMAN EXAMINATION ?Patient name: Amanda Burns MRN [id number]  Date of birth: [birth year] ?Chief Complaint:   ?Gynecologic Exam and Annual Exam ? ?History of Present Illness:   ?Amanda Burns is a 41 y.o. [phone] with h/o breast carcinoma female being seen today for a routine well-woman exam.  ? ?Menses recently started to return s/p chemo.  So far has had 3- denies HMB or dysmenorrhea.   ? ?Breast Ca- triple negative- s/p bilateral mastectomy with tissue expanders- 12/2019, 01/2021, s/p chemo- recently completed ? ?Notes significant itching in all creases- specifically groin.  On occasion will try OTC yeast medicine which will help.  Denies significant discharge, just itching.  No odor.  Not currently sexually active. ? ?Prior [facility] patient- records obtained. ? ?Patient's last menstrual period was 05/11/2021 (exact date). ?Denies issues with her menses ?The current method of family planning is abstinence.  ? ? ?Last pap 2019.  ?Last mammogram: followed by oncology. ?Last colonoscopy: n/a ? ? ?  06/03/2021  ?  8:49 AM 05/16/2013  ?  4:57 PM  ?Depression screen PHQ 2/9  ?Decreased Interest 0 0  ?Down, Depressed, Hopeless 0 0  ?PHQ - 2 Score 0 0  ?Altered sleeping 0   ?Tired, decreased energy 1   ?Change in appetite 1   ?Feeling bad or failure about yourself  0   ?Trouble concentrating 2   ?Moving slowly or fidgety/restless 0   ?Suicidal thoughts 0   ?PHQ-9 Score 4   ? ? ? ? ?Review of Systems:   ?Pertinent items are noted in HPI ?Denies any headaches, blurred vision, fatigue, shortness of breath, chest pain, abdominal pain, bowel movements, urination, or intercourse unless otherwise stated above. ? ?Pertinent History Reviewed:  ?Reviewed past medical,surgical, social and family history.  ?Reviewed problem list, medications and allergies. ?Physical Assessment:  ? ?Vitals:  ? 06/03/21 0848  ?BP: 113/71  ?Pulse: 97  ?Weight: 176 lb 3.2 oz (79.9 kg)  ?Height: '5\' 1"'$  (1.549 m)  ?Body mass index is 33.29 kg/m?. ?  ?      Physical Examination:  ? General appearance - well appearing, and in no distress ? Mental status - alert, oriented to person, place, and time ? Psych:  She has a normal mood and affect ? Skin - warm and dry, normal color, no suspicious lesions noted ? Chest - effort normal, all lung fields clear to auscultation bilaterally ? Heart - normal rate and regular rhythm ? Neck:  midline trachea, no thyromegaly or nodules ? Breasts - breasts appear normal, no suspicious masses, no skin or nipple changes or  axillary nodes ? Abdomen - soft, nontender, nondistended, no masses or organomegaly ? Pelvic - Bilateral groin with flaky skin, irregular red patch-like rash, VULVA: within labial fold, this white discharge noted, no tenderness or lesions  VAGINA: normal appearing vagina with normal color and discharge, no lesions  CERVIX: normal appearing cervix without discharge or lesions, no CMT ? Thin prep pap is done with HR HPV cotesting ? UTERUS: uterus is felt to be normal size, shape, consistency and nontender  ? ADNEXA: No adnexal masses or tenderness noted. ? Extremities:  No swelling or varicosities noted ? ?Chaperone: Glenard Haring Neas   ? ?Gentian violet used on bilateral groin area ? ? ?Assessment & Plan:  ?1) Well-Woman Exam ?-pap collected, reviewed guidelines ? ?2)Yeast infection ?-gentian violet applied, reviewed management ?-Rx for Diflucan sent in ?-f/u prn ? ?Meds:  ?Meds ordered this encounter  ?Medications  ? fluconazole (  DIFLUCAN) 150 MG tablet  ?  Sig: Take 1 tablet (150 mg total) by mouth once for 1 dose. Take additional tablet in 3 days if needed  ?  Dispense:  2 tablet  ?  Refill:  2  ? ? ?Follow-up: Return in about 1 year (around 06/04/2022) for Annual. ? ? ?Janyth Pupa, DO ?Attending [place] ?[facility] ? ? ?

## 2021-06-14 ENCOUNTER — Telehealth: Payer: Self-pay | Admitting: *Deleted

## 2021-06-14 NOTE — Telephone Encounter (Signed)
Received call from patient stating she was supposed to have drug assistance for her Amanda Burns but has received a bill for $28,000.  I have reached out to our revenue cycle administrator regarding this. Informed I would keep her updated on this matter.  Patient verbalized understanding.  ?

## 2021-06-22 ENCOUNTER — Other Ambulatory Visit: Payer: Self-pay | Admitting: Hematology and Oncology

## 2021-08-09 ENCOUNTER — Other Ambulatory Visit: Payer: Self-pay | Admitting: Hematology and Oncology

## 2021-09-02 ENCOUNTER — Other Ambulatory Visit: Payer: Self-pay | Admitting: Nurse Practitioner

## 2021-09-22 ENCOUNTER — Encounter: Payer: Self-pay | Admitting: *Deleted

## 2021-11-12 NOTE — Progress Notes (Signed)
Patient Care Team: Montez Hageman, DO as PCP - General (Family Medicine) Almond Lint, MD as Consulting Physician (General Surgery) Serena Croissant, MD as Consulting Physician (Hematology and Oncology) Antony Blackbird, MD as Consulting Physician (Radiation Oncology) Glenna Fellows, MD as Consulting Physician (Plastic Surgery)  DIAGNOSIS: No diagnosis found.  SUMMARY OF ONCOLOGIC HISTORY: Oncology History  Malignant neoplasm of upper-outer quadrant of left breast in female, estrogen receptor negative (HCC)  11/29/2019 Initial Diagnosis   Patient palpated an area of concern in the left breast. Diagnostic mammogram and US showed a 1.8cm mass in the upper outer right breast representing a fibroadenoma, and in the left breast, three adjacent masses at the 2-2:30 position, 1.7cm, 0.8cm, and 1.4cm, with several cysts and no axillary adenopathy. Biopsy showed a benign fibroadenoma in the right breast, and in the left breast, IDC at the 2:30 position, grade 3, HER-2 negative (1+), ER+ 0%, PR+ 0%, Ki67 80%.    12/04/2019 Cancer Staging   Staging form: Breast, AJCC 8th Edition - Clinical: Stage IB (cT1c, cN0, cM0, G3, ER-, PR-, HER2-) - Signed by Serena Croissant, MD on 12/04/2019   12/21/2019 Genetic Testing   Negative genetic testing:  No pathogenic variants detected on the [facility] Multi-Cancer Panel. A variant of uncertain significance (VUS) was detected in the CDK4 gene called c.415C>T (p.Arg139*). The report date is 12/21/2019.  The Multi-Cancer Panel offered by [facility] includes sequencing and/or deletion duplication testing of the following 85 genes: AIP, ALK, APC, ATM, AXIN2,BAP1,  BARD1, BLM, BMPR1A, BRCA1, BRCA2, BRIP1, CASR, CDC73, CDH1, CDK4, CDKN1B, CDKN1C, CDKN2A (p14ARF), CDKN2A (p16INK4a), CEBPA, CHEK2, CTNNA1, DICER1, DIS3L2, EGFR (c.2369C>T, p.Thr790Met variant only), EPCAM (Deletion/duplication testing only), FH, FLCN, GATA2, GPC3, GREM1 (Promoter region deletion/duplication testing only),  HOXB13 (c.251G>A, p.Gly84Glu), HRAS, KIT, MAX, MEN1, MET, MITF (c.952G>A, p.Glu318Lys variant only), MLH1, MSH2, MSH3, MSH6, MUTYH, NBN, NF1, NF2, NTHL1, PALB2, PDGFRA, PHOX2B, PMS2, POLD1, POLE, POT1, PRKAR1A, PTCH1, PTEN, RAD50, RAD51C, RAD51D, RB1, RECQL4, RET, RNF43, RUNX1, SDHAF2, SDHA (sequence changes only), SDHB, SDHC, SDHD, SMAD4, SMARCA4, SMARCB1, SMARCE1, STK11, SUFU, TERC, TERT, TMEM127, TP53, TSC1, TSC2, VHL, WRN and WT1.    01/09/2020 Surgery   Bilateral mastectomies with reconstruction [facility] & [facility]):  Right breast: no evidence of malignancy Left breast: multifocal invasive ductal carcinoma, grade 3, largest 2.2cm, with high grade DCIS, clear margins, 4 left axillary lymph nodes negative for carcinoma.   03/18/2020 - 08/28/2020 Chemotherapy   Adriamycin, Cytoxan Keytruda followed by Dorice Lamas Harrell Gave   09/17/2020 - 04/23/2021 Chemotherapy   Patient is on Treatment Plan : BREAST Pembrolizumab q21d        CHIEF COMPLIANT:   INTERVAL HISTORY: Amanda Burns is a   ALLERGIES:  is allergic to benzoyl peroxide, doxylamine-phenylephrine, and hydrocodone.  MEDICATIONS:  Current Outpatient Medications  Medication Sig Dispense Refill   acetaminophen (TYLENOL) 500 MG tablet Take 1,000 mg by mouth every 6 (six) hours as needed for moderate pain or headache.     ALPRAZolam (XANAX) 0.5 MG tablet TAKE 1 TABLET BY MOUTH THREE TIMES DAILY AS NEEDED FOR ANXIETY 60 tablet 0   BIOTIN PO Take by mouth.     bismuth subsalicylate (PEPTO BISMOL) 262 MG/15ML suspension Take 30 mLs by mouth every 6 (six) hours as needed for indigestion or diarrhea or loose stools.     buPROPion (WELLBUTRIN XL) 150 MG 24 hr tablet Take 150 mg by mouth daily.     dexmethylphenidate (FOCALIN XR) 10 MG 24 hr capsule Take 10 mg by mouth daily.  escitalopram (LEXAPRO) 20 MG tablet Take 20 mg by mouth daily.     fluticasone (FLONASE) 50 MCG/ACT nasal spray Place 1 spray into both nostrils daily as needed for  allergies or rhinitis.     ibuprofen (ADVIL) 200 MG tablet Take 400 mg by mouth every 6 (six) hours as needed for headache or moderate pain.     magnesium oxide (MAG-OX) 400 (240 Mg) MG tablet Take 1 tablet by mouth daily. 30 tablet 2   omeprazole (PRILOSEC) 20 MG capsule Take 40 mg by mouth daily.     oxyCODONE (OXY IR/ROXICODONE) 5 MG immediate release tablet Take 1 tablet (5 mg total) by mouth every 6 (six) hours as needed for severe pain. 5 tablet 0   spironolactone (ALDACTONE) 50 MG tablet Take 50 mg by mouth 2 (two) times daily.     No current facility-administered medications for this visit.   Facility-Administered Medications Ordered in Other Visits  Medication Dose Route Frequency Provider Last Rate Last Admin   sodium chloride flush (NS) 0.9 % injection 10 mL  10 mL Intravenous PRN [name], MD   10 mL at 03/18/20 1033    PHYSICAL EXAMINATION: ECOG PERFORMANCE STATUS: {CHL ONC ECOG PS:[id number]}  There were no vitals filed for this visit. There were no vitals filed for this visit.  BREAST:*** No palpable masses or nodules in either right or left breasts. No palpable axillary supraclavicular or infraclavicular adenopathy no breast tenderness or nipple discharge. (exam performed in the presence of a chaperone)  LABORATORY DATA:  I have reviewed the data as listed    Latest Ref Rng & Units 05/14/2021    9:19 AM 04/23/2021    8:49 AM 04/02/2021    8:26 AM  CMP  Glucose 70 - 99 mg/dL 93  80  88   BUN 6 - 20 mg/dL $Remove'10  12  14   'TZrGxeR$ Creatinine 0.44 - 1.00 mg/dL 0.84  0.86  0.92   Sodium 135 - 145 mmol/L 137  137  136   Potassium 3.5 - 5.1 mmol/L 4.1  4.3  4.2   Chloride 98 - 111 mmol/L 103  104  103   CO2 22 - 32 mmol/L $RemoveB'28  27  26   'XoYiFeNV$ Calcium 8.9 - 10.3 mg/dL 9.4  9.4  9.6   Total Protein 6.5 - 8.1 g/dL 7.7  7.4  7.5   Total Bilirubin 0.3 - 1.2 mg/dL 0.3  0.3  0.3   Alkaline Phos 38 - 126 U/L 102  124  128   AST 15 - 41 U/L $Remo'20  18  17   'ryXpN$ ALT 0 - 44 U/L $Remo'31  26  25     'TRgRv$ Lab  Results  Component Value Date   WBC 5.3 05/14/2021   HGB 12.2 05/14/2021   HCT 36.7 05/14/2021   MCV 78.3 (L) 05/14/2021   PLT 345 05/14/2021   NEUTROABS 3.6 05/14/2021    ASSESSMENT & PLAN:  No problem-specific Assessment & Plan notes found for this encounter.    No orders of the defined types were placed in this encounter.  The patient has a good understanding of the overall plan. she agrees with it. she will call with any problems that may develop before the next visit here. Total time spent: 30 mins including face to face time and time spent for planning, charting and co-ordination of care   Suzzette Righter, [place] 11/12/21    I Gardiner Coins am scribing for [name]  ***

## 2021-11-16 ENCOUNTER — Inpatient Hospital Stay: Payer: BC Managed Care – PPO

## 2021-11-16 ENCOUNTER — Other Ambulatory Visit: Payer: Self-pay

## 2021-11-16 ENCOUNTER — Other Ambulatory Visit: Payer: Self-pay | Admitting: *Deleted

## 2021-11-16 ENCOUNTER — Inpatient Hospital Stay: Payer: BC Managed Care – PPO | Attending: Hematology and Oncology | Admitting: Hematology and Oncology

## 2021-11-16 ENCOUNTER — Telehealth: Payer: Self-pay | Admitting: Hematology and Oncology

## 2021-11-16 VITALS — BP 126/83 | HR 83 | Temp 97.9°F | Resp 14 | Ht 61.0 in | Wt 164.2 lb

## 2021-11-16 DIAGNOSIS — K769 Liver disease, unspecified: Secondary | ICD-10-CM

## 2021-11-16 DIAGNOSIS — D509 Iron deficiency anemia, unspecified: Secondary | ICD-10-CM

## 2021-11-16 DIAGNOSIS — M899 Disorder of bone, unspecified: Secondary | ICD-10-CM | POA: Insufficient documentation

## 2021-11-16 DIAGNOSIS — K760 Fatty (change of) liver, not elsewhere classified: Secondary | ICD-10-CM | POA: Insufficient documentation

## 2021-11-16 DIAGNOSIS — C50412 Malignant neoplasm of upper-outer quadrant of left female breast: Secondary | ICD-10-CM

## 2021-11-16 DIAGNOSIS — Z171 Estrogen receptor negative status [ER-]: Secondary | ICD-10-CM | POA: Diagnosis not present

## 2021-11-16 DIAGNOSIS — Z9221 Personal history of antineoplastic chemotherapy: Secondary | ICD-10-CM | POA: Diagnosis not present

## 2021-11-16 DIAGNOSIS — Z853 Personal history of malignant neoplasm of breast: Secondary | ICD-10-CM | POA: Insufficient documentation

## 2021-11-16 DIAGNOSIS — Z9013 Acquired absence of bilateral breasts and nipples: Secondary | ICD-10-CM | POA: Diagnosis not present

## 2021-11-16 DIAGNOSIS — E041 Nontoxic single thyroid nodule: Secondary | ICD-10-CM | POA: Diagnosis not present

## 2021-11-16 LAB — CMP (CANCER CENTER ONLY)
ALT: 31 U/L (ref 0–44)
AST: 22 U/L (ref 15–41)
Albumin: 4.4 g/dL (ref 3.5–5.0)
Alkaline Phosphatase: 77 U/L (ref 38–126)
Anion gap: 5 (ref 5–15)
BUN: 20 mg/dL (ref 6–20)
CO2: 28 mmol/L (ref 22–32)
Calcium: 9.7 mg/dL (ref 8.9–10.3)
Chloride: 104 mmol/L (ref 98–111)
Creatinine: 0.73 mg/dL (ref 0.44–1.00)
GFR, Estimated: >60 mL/min (ref >60)
Glucose, Bld: 109 mg/dL — ABNORMAL HIGH (ref 70–99)
Potassium: 4 mmol/L (ref 3.5–5.1)
Sodium: 137 mmol/L (ref 135–145)
Total Bilirubin: 0.3 mg/dL (ref 0.3–1.2)
Total Protein: 7.2 g/dL (ref 6.5–8.1)

## 2021-11-16 LAB — CBC WITH DIFFERENTIAL (CANCER CENTER ONLY)
Abs Immature Granulocytes: 0.01 10*3/uL (ref 0.00–0.07)
Basophils Absolute: 0 10*3/uL (ref 0.0–0.1)
Basophils Relative: 0 %
Eosinophils Absolute: 0.1 10*3/uL (ref 0.0–0.5)
Eosinophils Relative: 2 %
HCT: 38.5 % (ref 36.0–46.0)
Hemoglobin: 12.9 g/dL (ref 12.0–15.0)
Immature Granulocytes: 0 %
Lymphocytes Relative: 21 %
Lymphs Abs: 1.6 10*3/uL (ref 0.7–4.0)
MCH: 27.7 pg (ref 26.0–34.0)
MCHC: 33.5 g/dL (ref 30.0–36.0)
MCV: 82.6 fL (ref 80.0–100.0)
Monocytes Absolute: 0.5 10*3/uL (ref 0.1–1.0)
Monocytes Relative: 6 %
Neutro Abs: 5.4 10*3/uL (ref 1.7–7.7)
Neutrophils Relative %: 71 %
Platelet Count: 307 10*3/uL (ref 150–400)
RBC: 4.66 MIL/uL (ref 3.87–5.11)
RDW: 13.5 % (ref 11.5–15.5)
WBC Count: 7.6 10*3/uL (ref 4.0–10.5)
nRBC: 0 % (ref 0.0–0.2)

## 2021-11-16 LAB — IRON AND IRON BINDING CAPACITY (CC-WL,HP ONLY)
Iron: 78 ug/dL (ref 28–170)
Saturation Ratios: 23 % (ref 10.4–31.8)
TIBC: 347 ug/dL (ref 250–450)
UIBC: 269 ug/dL (ref 148–442)

## 2021-11-16 LAB — FERRITIN: Ferritin: 45 ng/mL (ref 11–307)

## 2021-11-16 NOTE — Assessment & Plan Note (Signed)
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 Biopsy revealed IDC grade 3, ER 0%, PR 0%, Ki-67 80%, HER-2 -1+ T1c M0 stage IB  Treatment plan: 1.Bilateral mastectomies 01/09/2020: Right mastectomy:PASH Left mastectomy: Multifocal IDC grade 3 2.2 cm largest, high-grade DCIS, margins negative, 0/4 sentinel lymph nodes, ER negative, PR negative, HER-2 negative, Ki-67 80% 2.I recommended adjuvant chemotherapy with dose dense Adriamycin andCytoxanpembrolizumabfollowed by Taxol, carboplatin and pembrolizumabcompleted 08/28/2020 3.Maintenance Pembrolizumabcompleted 03/12/2021 ----------------------------------------------------------------------------------------------------------------------------------- Abdominal pain and muscle and joint aches and pains: 11/30/2020: Liver: 2.1 cm enhancement nonspecific, no definite metastatic disease. Left iliac bone lytic and sclerotic lesion: Nonspecific, multiple thyroid nodules. Bone lesions:10/7/22bone scan:No abnormal activity to correspond to the small sclerotic foci in the left pelvis seen last month Liver MRI 02/05/2021: Findings compatible with benign focal nodular hyperplasia. 6 to 12-month follow-up recommended ------------------------------------------------------------------------------------------------------------------------------ Breast cancer surveillance: 1.  Chest examination: Benign 2. no role of mammograms because she had bilateral mastectomies.  We discussed Signatera testing for minimal residual disease. Peripheral neuropathy: Resolved  History of microcytic anemia previously received IV iron therapy 

## 2021-11-16 NOTE — Progress Notes (Signed)
Per MD request, RN successfully faxed [place] req and path report [phone]).

## 2021-11-16 NOTE — Telephone Encounter (Signed)
Scheduled appointment per 9/5 los. Patient is aware.

## 2021-11-30 ENCOUNTER — Ambulatory Visit (HOSPITAL_COMMUNITY)
Admission: RE | Admit: 2021-11-30 | Discharge: 2021-11-30 | Disposition: A | Discharge status: Home / Self Care | Payer: BC Managed Care – PPO | Source: Ambulatory Visit | Attending: Hematology and Oncology | Admitting: Hematology and Oncology

## 2021-11-30 DIAGNOSIS — K769 Liver disease, unspecified: Secondary | ICD-10-CM | POA: Diagnosis not present

## 2021-11-30 MED ORDER — GADOBUTROL 1 MMOL/ML IV SOLN
7.5000 mL | Freq: Once | INTRAVENOUS | Status: AC | PRN
  Administered 2021-11-30: 7.5 mL via INTRAVENOUS

## 2021-12-08 ENCOUNTER — Encounter: Payer: Self-pay | Admitting: Hematology and Oncology

## 2021-12-14 ENCOUNTER — Ambulatory Visit (HOSPITAL_COMMUNITY)
Admission: RE | Admit: 2021-12-14 | Discharge: 2021-12-14 | Disposition: A | Discharge status: Home / Self Care | Payer: BC Managed Care – PPO | Source: Ambulatory Visit | Attending: Hematology and Oncology | Admitting: Hematology and Oncology

## 2021-12-14 ENCOUNTER — Encounter (HOSPITAL_COMMUNITY)
Admission: RE | Admit: 2021-12-14 | Discharge: 2021-12-14 | Disposition: A | Discharge status: Home / Self Care | Payer: BC Managed Care – PPO | Source: Ambulatory Visit | Attending: Hematology and Oncology | Admitting: Hematology and Oncology

## 2021-12-14 ENCOUNTER — Other Ambulatory Visit (HOSPITAL_COMMUNITY): Payer: BC Managed Care – PPO

## 2021-12-14 DIAGNOSIS — C50412 Malignant neoplasm of upper-outer quadrant of left female breast: Secondary | ICD-10-CM | POA: Insufficient documentation

## 2021-12-14 DIAGNOSIS — K769 Liver disease, unspecified: Secondary | ICD-10-CM | POA: Insufficient documentation

## 2021-12-14 DIAGNOSIS — Z171 Estrogen receptor negative status [ER-]: Secondary | ICD-10-CM | POA: Diagnosis present

## 2021-12-14 MED ORDER — TECHNETIUM TC 99M MEDRONATE IV KIT
19.2000 | PACK | Freq: Once | INTRAVENOUS | Status: AC | PRN
  Administered 2021-12-14: 19.2 via INTRAVENOUS

## 2021-12-14 MED ORDER — IOHEXOL 300 MG/ML  SOLN
100.0000 mL | Freq: Once | INTRAMUSCULAR | Status: AC | PRN
  Administered 2021-12-14: 100 mL via INTRAVENOUS

## 2021-12-15 NOTE — Progress Notes (Signed)
HEMATOLOGY-ONCOLOGY TELEPHONE VISIT PROGRESS NOTE  I connected with our patient on 12/20/21 at  3:15 PM EDT by telephone and verified that I am speaking with the correct person using two identifiers.  I discussed the limitations, risks, security and privacy concerns of performing an evaluation and management service by telephone and the availability of in person appointments.  I also discussed with the patient that there may be a patient responsible charge related to this service. The patient expressed understanding and agreed to proceed.   History of Present Illness: Amanda Burns is a 41 y.o. with above-mentioned history of breast cancer who underwent bilateral mastectomies with reconstruction. She presents to the clinic via phone to discuss results of scans.  Oncology History  Malignant neoplasm of upper-outer quadrant of left breast in female, estrogen receptor negative ([place])  11/29/2019 Initial Diagnosis   Patient palpated an area of concern in the left breast. Diagnostic mammogram and US showed a 1.8cm mass in the upper outer right breast representing a fibroadenoma, and in the left breast, three adjacent masses at the 2-2:30 position, 1.7cm, 0.8cm, and 1.4cm, with several cysts and no axillary adenopathy. Biopsy showed a benign fibroadenoma in the right breast, and in the left breast, IDC at the 2:30 position, grade 3, HER-2 negative (1+), ER+ 0%, PR+ 0%, Ki67 80%.    12/04/2019 Cancer Staging   Staging form: Breast, AJCC 8th Edition - Clinical: Stage IB (cT1c, cN0, cM0, G3, ER-, PR-, HER2-) - Signed by [name], MD on 12/04/2019   12/21/2019 Genetic Testing   Negative genetic testing:  No pathogenic variants detected on the [facility] Multi-Cancer Panel. A variant of uncertain significance (VUS) was detected in the CDK4 gene called c.415C>T (p.Arg139*). The report date is 12/21/2019.  The Multi-Cancer Panel offered by [facility] includes sequencing and/or deletion duplication testing of the  following 85 genes: AIP, ALK, APC, ATM, AXIN2,BAP1,  BARD1, BLM, BMPR1A, BRCA1, BRCA2, BRIP1, CASR, CDC73, CDH1, CDK4, CDKN1B, CDKN1C, CDKN2A (p14ARF), CDKN2A (p16INK4a), CEBPA, CHEK2, CTNNA1, DICER1, DIS3L2, EGFR (c.2369C>T, p.Thr790Met variant only), EPCAM (Deletion/duplication testing only), FH, FLCN, GATA2, GPC3, GREM1 (Promoter region deletion/duplication testing only), HOXB13 (c.251G>A, p.Gly84Glu), HRAS, KIT, MAX, MEN1, MET, MITF (c.952G>A, p.Glu318Lys variant only), MLH1, MSH2, MSH3, MSH6, MUTYH, NBN, NF1, NF2, NTHL1, PALB2, PDGFRA, PHOX2B, PMS2, POLD1, POLE, POT1, PRKAR1A, PTCH1, PTEN, RAD50, RAD51C, RAD51D, RB1, RECQL4, RET, RNF43, RUNX1, SDHAF2, SDHA (sequence changes only), SDHB, SDHC, SDHD, SMAD4, SMARCA4, SMARCB1, SMARCE1, STK11, SUFU, TERC, TERT, TMEM127, TP53, TSC1, TSC2, VHL, WRN and WT1.    01/09/2020 Surgery   Bilateral mastectomies with reconstruction [facility]):  Right breast: no evidence of malignancy Left breast: multifocal invasive ductal carcinoma, grade 3, largest 2.2cm, with high grade DCIS, clear margins, 4 left axillary lymph nodes negative for carcinoma.   03/18/2020 - 08/28/2020 Chemotherapy   Adriamycin, Cytoxan Keytruda followed by [facility]   09/17/2020 - 04/23/2021 Chemotherapy   Patient is on Treatment Plan : BREAST Pembrolizumab q21d        REVIEW OF SYSTEMS:   Constitutional: Denies fevers, chills or abnormal weight loss All other systems were reviewed with the patient and are negative. Observations/Objective:     Assessment Plan:  Malignant neoplasm of upper-outer quadrant of left breast in female, estrogen receptor negative ([place]) Palpable left breast mass: 1.8 cm UOQ fibroadenoma in the right breast, left breast 3 masses 2 to 2:30 position: 1.7, 1.8, 1.4 cm.  Biopsy revealed IDC grade 3, ER 0%, PR 0%, Ki-67 80%, HER-2 -1+ T1c M0  stage IB   Treatment plan: 1.    Bilateral mastectomies 01/09/2020: Right mastectomy:PASH Left  mastectomy: Multifocal IDC grade 3 2.2 cm largest, high-grade DCIS, margins negative, 0/4 sentinel lymph nodes, ER negative, PR negative, HER-2 negative, Ki-67 80% 2.  I recommended adjuvant chemotherapy with dose dense Adriamycin and Cytoxan pembrolizumab followed by Taxol, carboplatin and pembrolizumab completed 08/28/2020 3.  Maintenance Pembrolizumab completed 03/12/2021 ----------------------------------------------------------------------------------------------------------------------------------- Abdominal pain and muscle and joint aches and pains: 11/30/2020: Liver: 2.1 cm enhancement nonspecific, no definite metastatic disease.  Left iliac bone lytic and sclerotic lesion: Nonspecific, multiple thyroid nodules. Bone lesions: 12/18/20 bone scan: No abnormal activity to correspond to the small sclerotic foci in the left pelvis seen last month Liver MRI 02/05/2021: Findings compatible with benign focal nodular hyperplasia.  6 to 54-month follow-up recommended ------------------------------------------------------------------------------------------------------------------------------ Breast cancer surveillance: 1.  Chest examination: Benign 2. no role of mammograms because she had bilateral mastectomies.   Signatera: 11/25/2021: Not detected Peripheral neuropathy: Resolved 11/30/2021: MRI liver: Decreased conspicuity of the hepatic lesions suggestive of benign/indolent process 12/14/2021: CT CAP: No new metastatic disease.  Prominent left axillary lymph node stable, stable bilateral lung nodules, benign liver lesion.  Similar lesion left iliac bone and left acetabulum (benign), thyroid nodules 12/14/2021: Bone scan: Benign   Prior history of iron deficiency anemia: 12/17/2021: Hemoglobin 13.4  Return to clinic in 1 year for follow-up   I discussed the assessment and treatment plan with the patient. The patient was provided an opportunity to ask questions and all were answered. The patient agreed  with the plan and demonstrated an understanding of the instructions. The patient was advised to call back or seek an in-person evaluation if the symptoms worsen or if the condition fails to improve as anticipated.   I provided 12 minutes of non-face-to-face time during this encounter.  This includes time for charting and coordination of care   Harriette Ohara, MD  I Gardiner Coins am scribing for [name]  I have reviewed the above documentation for accuracy and completeness, and I agree with the above.

## 2021-12-17 LAB — SIGNATERA ONLY (NATERA MANAGED)
SIGNATERA MTM READOUT: 0 MTM/ml
SIGNATERA TEST RESULT: NEGATIVE

## 2021-12-20 ENCOUNTER — Telehealth: Payer: Self-pay | Admitting: Hematology and Oncology

## 2021-12-20 ENCOUNTER — Inpatient Hospital Stay: Payer: BC Managed Care – PPO | Attending: Hematology and Oncology | Admitting: Hematology and Oncology

## 2021-12-20 DIAGNOSIS — C50412 Malignant neoplasm of upper-outer quadrant of left female breast: Secondary | ICD-10-CM

## 2021-12-20 DIAGNOSIS — Z171 Estrogen receptor negative status [ER-]: Secondary | ICD-10-CM

## 2021-12-20 NOTE — Telephone Encounter (Signed)
Scheduled appointment per 10/9 los. Left voicemail.

## 2021-12-20 NOTE — Assessment & Plan Note (Signed)
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 Biopsy revealed IDC grade 3, ER 0%, PR 0%, Ki-67 80%, HER-2 -1+ T1c M0 stage IB  Treatment plan: 1.Bilateral mastectomies 01/09/2020: Right mastectomy:PASH Left mastectomy: Multifocal IDC grade 3 2.2 cm largest, high-grade DCIS, margins negative, 0/4 sentinel lymph nodes, ER negative, PR negative, HER-2 negative, Ki-67 80% 2.I recommended adjuvant chemotherapy with dose dense Adriamycin andCytoxanpembrolizumabfollowed by Taxol, carboplatin and pembrolizumabcompleted 08/28/2020 3.Maintenance Pembrolizumabcompleted 03/12/2021 ----------------------------------------------------------------------------------------------------------------------------------- Abdominal pain and muscle and joint aches and pains: 11/30/2020: Liver: 2.1 cm enhancement nonspecific, no definite metastatic disease. Left iliac bone lytic and sclerotic lesion: Nonspecific, multiple thyroid nodules. Bone lesions:10/7/22bone scan:No abnormal activity to correspond to the small sclerotic foci in the left pelvis seen last month Liver MRI 02/05/2021: Findings compatible with benign focal nodular hyperplasia. 6 to 51-month follow-up recommended ------------------------------------------------------------------------------------------------------------------------------ Breast cancer surveillance: 1.  Chest examination: Benign 2. no role of mammograms because she had bilateral mastectomies.  We discussed Signatera testing for minimal residual disease. Peripheral neuropathy: Resolved 11/30/2021: MRI liver: Decreased conspicuity of the hepatic lesions suggestive of benign/indolent process 12/14/2021: CT CAP: No new metastatic disease.  Prominent left axillary lymph node stable, stable bilateral lung nodules, benign liver lesion.  Similar lesion left iliac bone and left acetabulum (benign),  thyroid nodules 12/14/2021: Bone scan: Benign   Prior history of iron deficiency anemia: 12/17/2021: Hemoglobin 13.4  Return to clinic in 1 year for follow-up

## 2021-12-21 ENCOUNTER — Encounter (HOSPITAL_COMMUNITY): Payer: Self-pay

## 2022-03-03 LAB — SIGNATERA
SIGNATERA MTM READOUT: 0 MTM/ml
SIGNATERA TEST RESULT: NEGATIVE

## 2022-03-10 ENCOUNTER — Telehealth: Payer: Self-pay

## 2022-03-10 NOTE — Telephone Encounter (Signed)
Called pt per MD to advise Signatera testing was negative/not detected. Pt verbalized understanding of results and knows [facility] will be in touch to schedule 3 mo repeat lab.   

## 2022-03-24 ENCOUNTER — Other Ambulatory Visit: Payer: Self-pay | Admitting: Hematology and Oncology

## 2022-05-02 ENCOUNTER — Encounter: Payer: Self-pay | Admitting: Hematology and Oncology

## 2022-05-09 ENCOUNTER — Ambulatory Visit (INDEPENDENT_AMBULATORY_CARE_PROVIDER_SITE_OTHER): Payer: BLUE CROSS/BLUE SHIELD | Admitting: Obstetrics & Gynecology

## 2022-05-09 ENCOUNTER — Encounter: Payer: Self-pay | Admitting: Obstetrics & Gynecology

## 2022-05-09 ENCOUNTER — Other Ambulatory Visit (HOSPITAL_COMMUNITY)
Admission: RE | Admit: 2022-05-09 | Discharge: 2022-05-09 | Disposition: A | Discharge status: Home / Self Care | Payer: BLUE CROSS/BLUE SHIELD | Source: Ambulatory Visit | Attending: Obstetrics & Gynecology | Admitting: Obstetrics & Gynecology

## 2022-05-09 VITALS — BP 112/74 | HR 103 | Ht 61.0 in | Wt 153.0 lb

## 2022-05-09 DIAGNOSIS — Z853 Personal history of malignant neoplasm of breast: Secondary | ICD-10-CM

## 2022-05-09 DIAGNOSIS — Z124 Encounter for screening for malignant neoplasm of cervix: Secondary | ICD-10-CM

## 2022-05-09 DIAGNOSIS — Z113 Encounter for screening for infections with a predominantly sexual mode of transmission: Secondary | ICD-10-CM | POA: Diagnosis not present

## 2022-05-09 DIAGNOSIS — Z3009 Encounter for other general counseling and advice on contraception: Secondary | ICD-10-CM | POA: Diagnosis not present

## 2022-05-09 NOTE — Progress Notes (Signed)
   GYN VISIT Patient name: Amanda Burns MRN [id number]  Date of birth: Apr 17, 1980 Chief Complaint:   Contraception  History of Present Illness:   Amanda Burns is a 42 y.o. G66P2002 female with h/o triple neg breast Ca being seen today for the following concerns:  -Contraceptive management: Was in a new relationship (though recently ended) and wanted to discuss contraceptive options.  Additionally periods have been somewhat irregular- sometimes every 2 wks occasionally every 6wks.  Sometimes light to moderate bleeding, other times heavy bleeding.  While it's tolerable, she was hoping to address both issues.  Denies irregular discharge, itching or irritation.  Denies pelvic or abdominal pain.  Pap collected 2023- however results not completed   Patient's last menstrual period was 04/22/2022 (exact date).     06/03/2021    8:49 AM 05/16/2013    4:57 PM  Depression screen PHQ 2/9  Decreased Interest 0 0  Down, Depressed, Hopeless 0 0  PHQ - 2 Score 0 0  Altered sleeping 0   Tired, decreased energy 1   Change in appetite 1   Feeling bad or failure about yourself  0   Trouble concentrating 2   Moving slowly or fidgety/restless 0   Suicidal thoughts 0   PHQ-9 Score 4      Review of Systems:   Pertinent items are noted in HPI Denies fever/chills, dizziness, headaches, visual disturbances, fatigue, shortness of breath, chest pain, abdominal pain, vomiting, no problems with bowel movements, urination, or intercourse unless otherwise stated above.  Pertinent History Reviewed:  Reviewed past medical,surgical, social, obstetrical and family history.  Reviewed problem list, medications and allergies. Physical Assessment:   Vitals:   05/09/22 0914  BP: 112/74  Pulse: (!) 103  Weight: 153 lb (69.4 kg)  Height: 5' 1"$  (1.549 m)  Body mass index is 28.91 kg/m.       Physical Examination:   General appearance: alert, well appearing, and in no distress  Psych: mood appropriate, normal  affect  Skin: warm & dry   Cardiovascular: normal heart rate noted  Respiratory: normal respiratory effort, no distress  Abdomen: soft, non-tender   Pelvic: VULVA: normal appearing vulva with no masses, tenderness or lesions, VAGINA: normal appearing vagina with normal color and discharge, no lesions, CERVIX: normal appearing cervix without discharge or lesions, UTERUS: uterus is normal size, shape, consistency and nontender  Extremities: no edema   Chaperone:  Mickey Farber     Assessment & Plan:  1) preventive screening -pap and STI screening to be completed -reviewed ASCCP guidelines  2) Contraceptive management -based on triple negative status, suspect OCPs ok but will check with oncology -also reviewed non-hormonal options -pt would prefer OCPs, await approval to start Rx  ADDENDUM_ [name] replied- ok with OCPs, Rx sent in  Orders Placed This Encounter  Procedures   HIV Antibody (routine testing w rflx)   RPR    Return in about 1 year (around 05/10/2023) for Annual.   Janyth Pupa, DO Attending [place], [place] for [place]

## 2022-05-10 LAB — RPR: RPR Ser Ql: NONREACTIVE

## 2022-05-10 LAB — HIV ANTIBODY (ROUTINE TESTING W REFLEX): HIV Screen 4th Generation wRfx: NONREACTIVE

## 2022-05-10 MED ORDER — NORETHIN ACE-ETH ESTRAD-FE 1-20 MG-MCG(24) PO TABS: 1.0000 | ORAL_TABLET | Freq: Every day | ORAL | 4 refills | Status: AC

## 2022-05-10 NOTE — Addendum Note (Signed)
Addended by: Annalee Genta on: 05/10/2022 07:11 PM   Modules accepted: Orders

## 2022-05-11 LAB — CYTOLOGY - PAP
Chlamydia: NEGATIVE
Comment: NEGATIVE
Comment: NEGATIVE
Comment: NORMAL
Diagnosis: NEGATIVE
Diagnosis: REACTIVE
High risk HPV: NEGATIVE
Neisseria Gonorrhea: NEGATIVE

## 2022-05-17 IMAGING — MR MR ABDOMEN WO/W CM
18 series · 48 of 48 positions shown · IV contrast (7ml GADAVIST)
Comparison: Multiple exams, including 11/30/2020 and 05/28/2020

CLINICAL DATA: Breast cancer, subtle posterior right hepatic lobe
lesion lesion in the liver for follow up.

EXAM:
MRI ABDOMEN WITHOUT AND WITH CONTRAST
TECHNIQUE: Multiplanar multisequence MR imaging of the abdomen was performed
both before and after the administration of intravenous contrast.
CONTRAST:  7mL GADAVIST GADOBUTROL 1 MMOL/ML IV SOLN

[Series 2: T2 fat-sat · axial · 6.0mm · 1.25mm/px · z∈[-122,+115]mm · 2 of 34 slices shown]
[im 1/34]
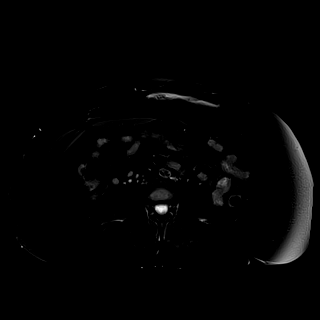
[im 34/34]
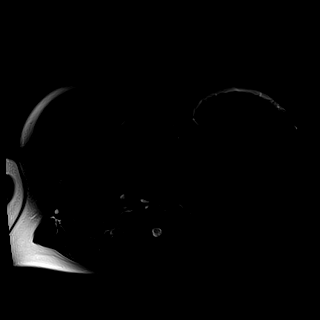

[Series 4: DWI · axial · 6.0mm · 1.49mm/px · z∈[-117,+114]mm · 4 of 66 slices shown (1 of 2)]
[im 1/66]
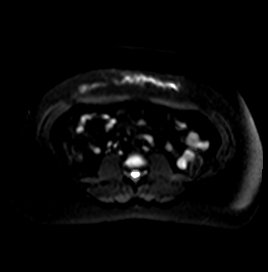
[im 22/66]
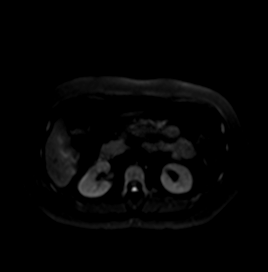
[im 44/66]
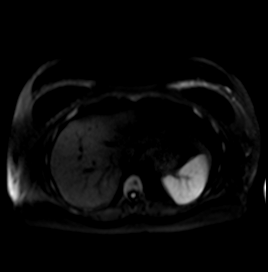
[im 66/66]
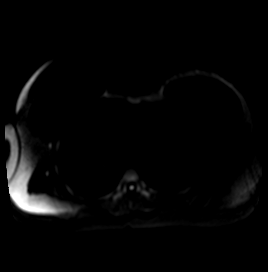

[Series 5: DWI · axial · 6.0mm · 1.49mm/px · z∈[-117,+114]mm · 2 of 33 slices shown (2 of 2)]
[im 1/33]
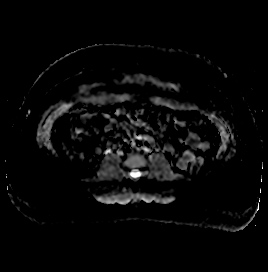
[im 33/33]
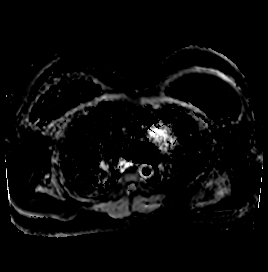

[Series 7: T2 · coronal · 6.0mm · 1.56mm/px · 2 of 36 slices shown (1 of 2)]
[im 1/36]
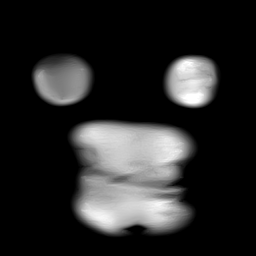
[im 36/36]
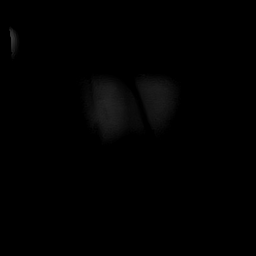

[Series 8: T1 · axial · 3.0mm · 1.25mm/px · z∈[-133,+104]mm · 3 of 80 slices shown (1 of 2)]
[im 1/80]
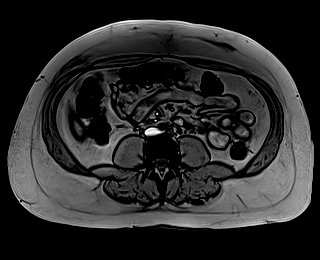
[im 40/80]
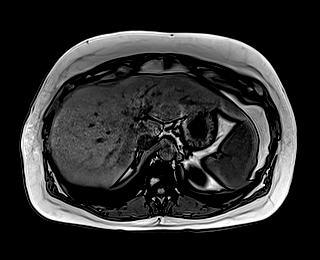
[im 80/80]
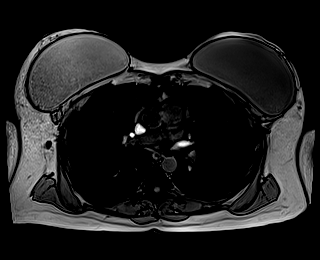

[Series 9: T1 · axial · 3.0mm · 1.25mm/px · z∈[-133,+104]mm · 3 of 80 slices shown (2 of 2)]
[im 1/80]
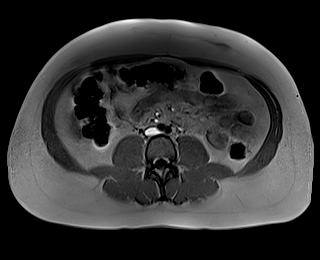
[im 40/80]
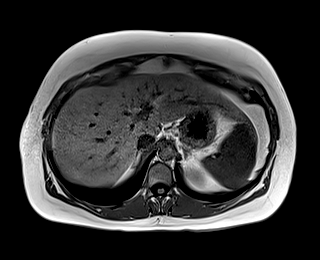
[im 80/80]
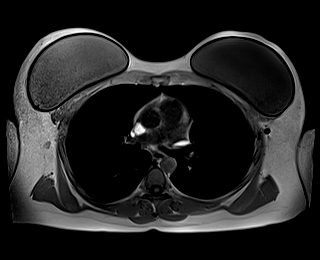

[Series 10: bSSFP · axial · 4.0mm · 0.84mm/px · z∈[-137,+95]mm · 2 of 59 slices shown]
[im 1/59]
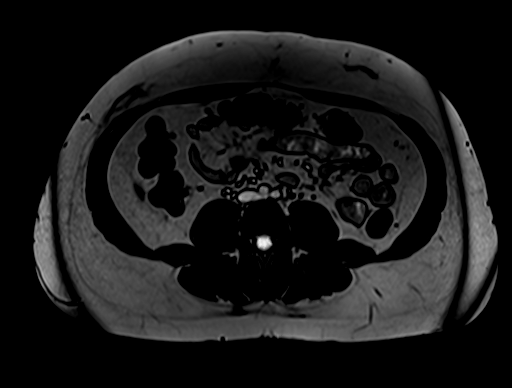
[im 59/59]
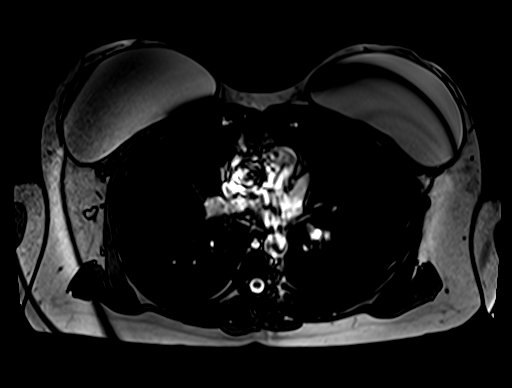

[Series 12: T1 dynamic · axial · 3.0mm · 1.25mm/px · z∈[-153,+108]mm · 3 of 88 slices shown (1 of 10)]
[im 1/88]
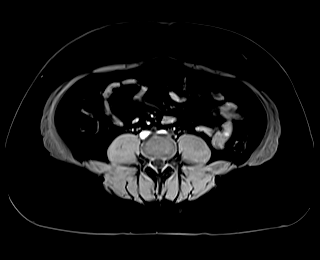
[im 44/88]
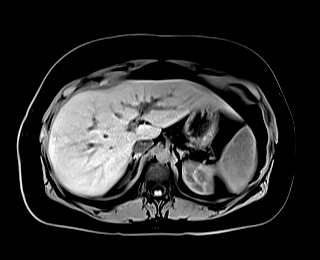
[im 88/88]
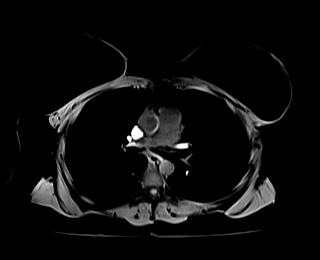

[Series 16: T1 dynamic · axial · 3.0mm · 1.25mm/px · z∈[-153,+108]mm · 3 of 88 slices shown (2 of 10)]
[im 1/88]
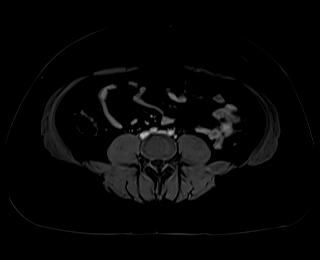
[im 44/88]
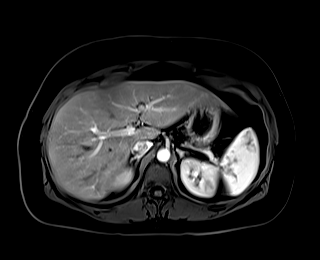
[im 88/88]
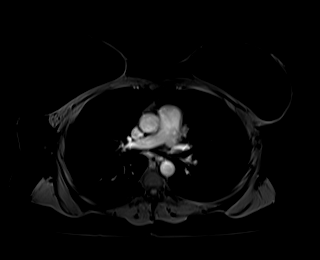

[Series 17: T1 dynamic · axial · 3.0mm · 1.25mm/px · z∈[-153,+108]mm · 3 of 88 slices shown (3 of 10)]
[im 1/88]
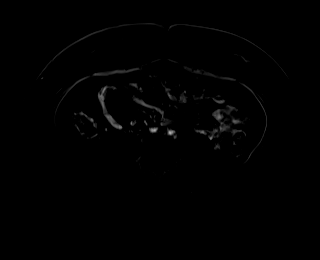
[im 44/88]
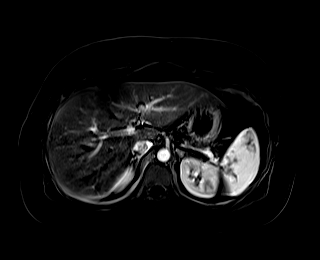
[im 88/88]
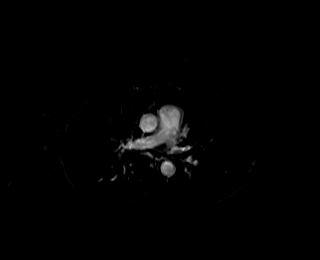

[Series 20: T1 dynamic · axial · 3.0mm · 1.25mm/px · z∈[-153,+108]mm · 3 of 88 slices shown (4 of 10)]
[im 1/88]
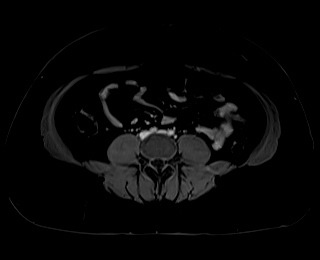
[im 44/88]
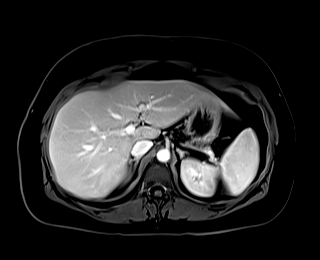
[im 88/88]
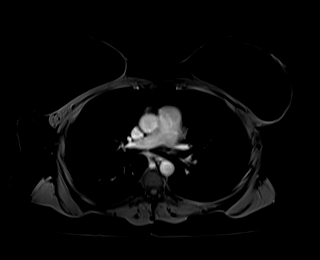

[Series 21: T1 dynamic · axial · 3.0mm · 1.25mm/px · z∈[-153,+108]mm · 3 of 88 slices shown (5 of 10)]
[im 1/88]
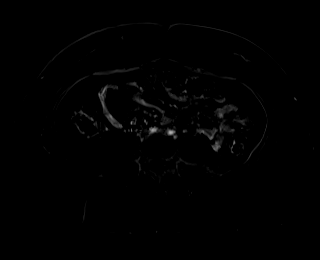
[im 44/88]
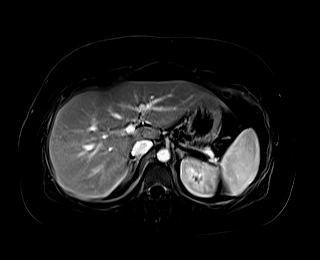
[im 88/88]
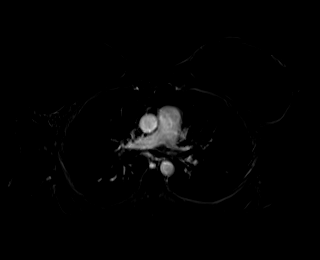

[Series 24: T1 dynamic · axial · 3.0mm · 1.25mm/px · z∈[-153,+108]mm · 3 of 88 slices shown (6 of 10)]
[im 1/88]
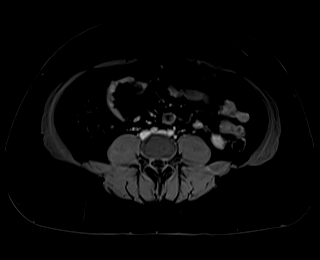
[im 44/88]
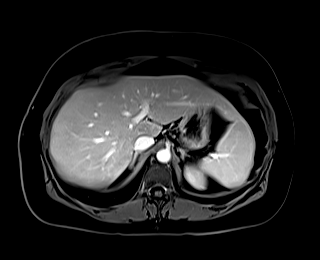
[im 88/88]
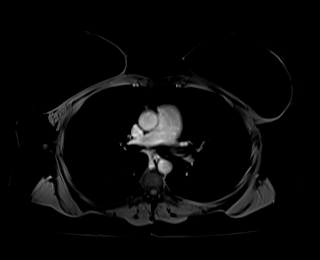

[Series 25: T1 dynamic · axial · 3.0mm · 1.25mm/px · z∈[-153,+108]mm · 3 of 88 slices shown (7 of 10)]
[im 1/88]
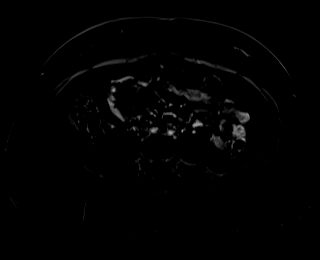
[im 44/88]
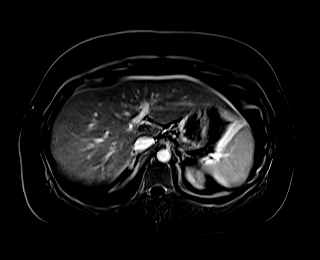
[im 88/88]
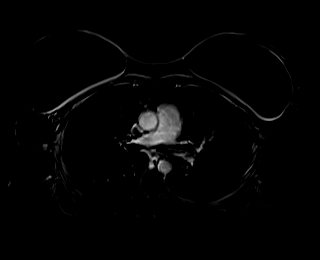

[Series 27: T1 dynamic · coronal · 5.0mm · 1.41mm/px · 2 of 52 slices shown (8 of 10)]
[im 1/52]
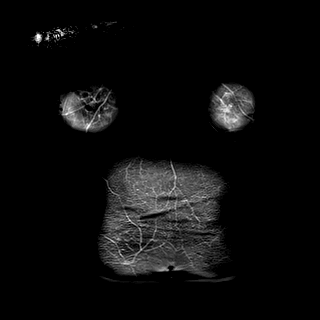
[im 52/52]
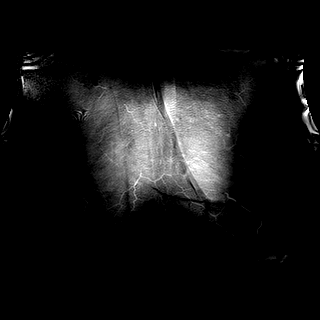

[Series 31: T1 dynamic · axial · 3.0mm · 1.25mm/px · z∈[-153,+108]mm · 3 of 88 slices shown (9 of 10)]
[im 1/88]
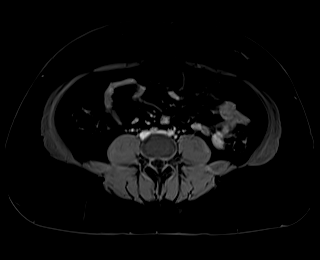
[im 44/88]
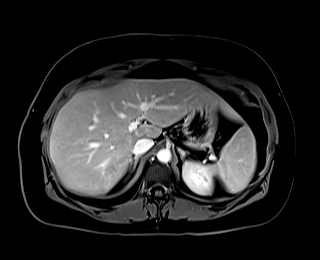
[im 88/88]
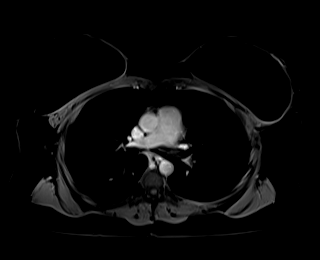

[Series 32: T1 dynamic · axial · 3.0mm · 1.25mm/px · z∈[-153,+108]mm · 3 of 88 slices shown (10 of 10)]
[im 1/88]
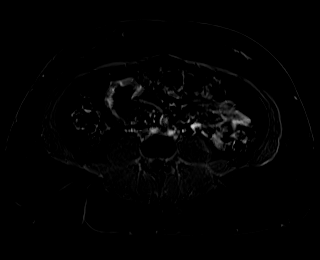
[im 44/88]
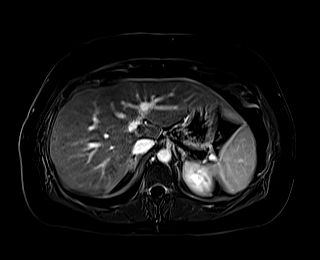
[im 88/88]
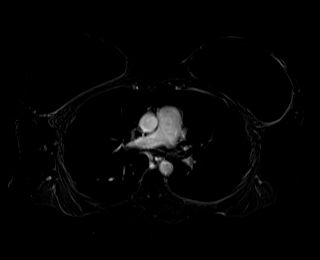

[Series 33: T2 · axial · 6.0mm · 1.56mm/px · 1 of 34 slices shown (2 of 2)]
[im 1/34]
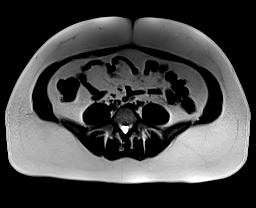

[48 of 48 positions shown; findings below may reference images not displayed]

FINDINGS: Lower chest: Bilateral breast implants.  Otherwise unremarkable.

Hepatobiliary: The area of concern posteriorly in the right hepatic
lobe is posterior to the right hepatic vein for example on image 9
of series 2 and accordingly is in segment VII (not segment VIII).
This lesion is very faintly T2 hyperintense on axial T2 blade images
such as image 9 series 2 although less readily apparent on the axial
T2 haste images. The lesion has early arterial phase enhancement
measuring at 2.8 by 1.9 cm on image 33 series 16, and subsequently
appears essentially isointense, and is inconspicuous on the
remaining precontrast images. This does not represent fatty sparing.
Given the conspicuity only on early arterial phase images and poor
visibility on all other sequences, this is highly likely to be focal
nodular hyperplasia. This is not a characteristic appearance for
breast cancer metastatic disease to the liver.

The liver appears otherwise normal.  Gallbladder unremarkable.

Pancreas:  Unremarkable

Spleen:  Unremarkable

Adrenals/Urinary Tract:  Unremarkable

Stomach/Bowel: Small type 1 hiatal hernia.

Vascular/Lymphatic:  Unremarkable

Other:  No supplemental non-categorized findings.

Musculoskeletal: Unremarkable
IMPRESSION: 1. The area of concern posteriorly in the right hepatic lobe
(segment VII) demonstrates early arterial phase enhancement but is
inconspicuous on other sequences. The only precontrast sequence
where this is faintly seen is the axial T2 fat saturated BLADE
sequence. The appearance is most compatible with benign focal
nodular hyperplasia and is not characteristic of metastatic breast
cancer. In the absence of abnormal liver enzymes or other specific
concern, this does not require further follow up. If the does have
abnormal liver enzymes or if otherwise clinically warranted,
consider follow hepatic protocol MRI with and without contrast in
6-12 months time specifically using Eovist contrast medium, which
can provide further diagnostic specificity with respect to FNH.
2. Small type 1 hiatal hernia.

## 2022-06-14 ENCOUNTER — Other Ambulatory Visit: Payer: Self-pay | Admitting: *Deleted

## 2022-06-15 MED ORDER — FLUCONAZOLE 150 MG PO TABS: 150.0000 mg | ORAL_TABLET | Freq: Once | ORAL | 0 refills | Status: AC

## 2022-06-27 ENCOUNTER — Encounter: Payer: Self-pay | Admitting: Hematology and Oncology

## 2022-06-28 ENCOUNTER — Encounter: Payer: Self-pay | Admitting: *Deleted

## 2022-06-29 ENCOUNTER — Other Ambulatory Visit: Payer: Self-pay | Admitting: *Deleted

## 2022-06-29 MED ORDER — FLUCONAZOLE 150 MG PO TABS: 150.0000 mg | ORAL_TABLET | Freq: Once | ORAL | 0 refills | Status: AC

## 2022-07-27 LAB — SIGNATERA
SIGNATERA MTM READOUT: 0 MTM/ml
SIGNATERA TEST RESULT: NEGATIVE

## 2022-07-28 ENCOUNTER — Telehealth: Payer: Self-pay

## 2022-07-28 NOTE — Telephone Encounter (Signed)
Called and given negative signatera result. Pt verbalized understanding.

## 2022-08-31 ENCOUNTER — Encounter: Payer: Self-pay | Admitting: Hematology and Oncology

## 2022-10-31 LAB — SIGNATERA: SIGNATERA MTM READOUT: 0 MTM/ml

## 2022-12-21 ENCOUNTER — Ambulatory Visit: Payer: BC Managed Care – PPO | Admitting: Hematology and Oncology

## 2023-03-21 ENCOUNTER — Encounter: Payer: Self-pay | Admitting: Hematology and Oncology

## 2023-03-28 ENCOUNTER — Inpatient Hospital Stay: Payer: BC Managed Care – PPO | Attending: Hematology and Oncology | Admitting: Hematology and Oncology

## 2023-03-28 ENCOUNTER — Other Ambulatory Visit: Payer: Self-pay | Admitting: *Deleted

## 2023-03-28 VITALS — BP 124/83 | HR 120 | Temp 97.5°F | Resp 18 | Ht 61.0 in | Wt 172.0 lb

## 2023-03-28 DIAGNOSIS — Z171 Estrogen receptor negative status [ER-]: Secondary | ICD-10-CM | POA: Diagnosis not present

## 2023-03-28 DIAGNOSIS — Z9221 Personal history of antineoplastic chemotherapy: Secondary | ICD-10-CM | POA: Diagnosis not present

## 2023-03-28 DIAGNOSIS — C50412 Malignant neoplasm of upper-outer quadrant of left female breast: Secondary | ICD-10-CM | POA: Diagnosis not present

## 2023-03-28 DIAGNOSIS — Z9013 Acquired absence of bilateral breasts and nipples: Secondary | ICD-10-CM | POA: Insufficient documentation

## 2023-03-28 DIAGNOSIS — M899 Disorder of bone, unspecified: Secondary | ICD-10-CM | POA: Insufficient documentation

## 2023-03-28 DIAGNOSIS — K769 Liver disease, unspecified: Secondary | ICD-10-CM | POA: Diagnosis not present

## 2023-03-28 DIAGNOSIS — Z853 Personal history of malignant neoplasm of breast: Secondary | ICD-10-CM | POA: Insufficient documentation

## 2023-03-28 DIAGNOSIS — E041 Nontoxic single thyroid nodule: Secondary | ICD-10-CM | POA: Diagnosis not present

## 2023-03-28 NOTE — Progress Notes (Signed)
 Patient Care Team: Odean Potts, MD as PCP - General (Hematology and Oncology) Aron Shoulders, MD as Consulting Physician (General Surgery) Odean Potts, MD as Consulting Physician (Hematology and Oncology) Shannon Agent, MD as Consulting Physician (Radiation Oncology) Arelia Filippo, MD as Consulting Physician (Plastic Surgery)  DIAGNOSIS:  Encounter Diagnosis  Name Primary?   Malignant neoplasm of upper-outer quadrant of left breast in female, estrogen receptor negative (HCC) Yes    SUMMARY OF ONCOLOGIC HISTORY: Oncology History  Malignant neoplasm of upper-outer quadrant of left breast in female, estrogen receptor negative (HCC)  11/29/2019 Initial Diagnosis   Patient palpated an area of concern in the left breast. Diagnostic mammogram and US  showed a 1.8cm mass in the upper outer right breast representing a fibroadenoma, and in the left breast, three adjacent masses at the 2-2:30 position, 1.7cm, 0.8cm, and 1.4cm, with several cysts and no axillary adenopathy. Biopsy showed a benign fibroadenoma in the right breast, and in the left breast, IDC at the 2:30 position, grade 3, HER-2 negative (1+), ER+ 0%, PR+ 0%, Ki67 80%.    12/04/2019 Cancer Staging   Staging form: Breast, AJCC 8th Edition - Clinical: Stage IB (cT1c, cN0, cM0, G3, ER-, PR-, HER2-) - Signed by Odean Potts, MD on 12/04/2019   12/21/2019 Genetic Testing   Negative genetic testing:  No pathogenic variants detected on the [facility] Multi-Cancer Panel. A variant of uncertain significance (VUS) was detected in the CDK4 gene called c.415C>T (p.Arg139*). The report date is 12/21/2019.  The Multi-Cancer Panel offered by [facility] includes sequencing and/or deletion duplication testing of the following 85 genes: AIP, ALK, APC, ATM, AXIN2,BAP1,  BARD1, BLM, BMPR1A, BRCA1, BRCA2, BRIP1, CASR, CDC73, CDH1, CDK4, CDKN1B, CDKN1C, CDKN2A (p14ARF), CDKN2A (p16INK4a), CEBPA, CHEK2, CTNNA1, DICER1, DIS3L2, EGFR (c.2369C>T, p.Thr790Met  variant only), EPCAM (Deletion/duplication testing only), FH, FLCN, GATA2, GPC3, GREM1 (Promoter region deletion/duplication testing only), HOXB13 (c.251G>A, p.Gly84Glu), HRAS, KIT, MAX, MEN1, MET, MITF (c.952G>A, p.Glu318Lys variant only), MLH1, MSH2, MSH3, MSH6, MUTYH, NBN, NF1, NF2, NTHL1, PALB2, PDGFRA, PHOX2B, PMS2, POLD1, POLE, POT1, PRKAR1A, PTCH1, PTEN, RAD50, RAD51C, RAD51D, RB1, RECQL4, RET, RNF43, RUNX1, SDHAF2, SDHA (sequence changes only), SDHB, SDHC, SDHD, SMAD4, SMARCA4, SMARCB1, SMARCE1, STK11, SUFU, TERC, TERT, TMEM127, TP53, TSC1, TSC2, VHL, WRN and WT1.    01/09/2020 Surgery   Bilateral mastectomies with reconstruction [facility]):  Right breast: no evidence of malignancy Left breast: multifocal invasive ductal carcinoma, grade 3, largest 2.2cm, with high grade DCIS, clear margins, 4 left axillary lymph nodes negative for carcinoma.   03/18/2020 - 08/28/2020 Chemotherapy   Adriamycin , Cytoxan  Keytruda  followed by Taxol  carbo Keytruda    09/17/2020 - 04/23/2021 Chemotherapy   Patient is on Treatment Plan : BREAST Pembrolizumab  q21d        CHIEF COMPLIANT: Surveillance of breast cancer  HISTORY OF PRESENT ILLNESS:  History of Present Illness   Amanda Burns, a patient with a history of cancer, presents for a routine follow-up. She reports a chronic dry cough that has been ongoing for about a year. The cough is described as dry and non-productive, with no associated symptoms such as chest pain or shortness of breath. She has tried various treatments, including antibiotics, with no relief. Recently, she started taking gabapentin , which has been effective in managing her cough. She denies any new or worsening symptoms related to her cancer history. She also mentions a recent issue with her health insurance, which led to a delay in her regular check-ups. She expresses anxiety about her health, particularly in relation to her family  history of cancer.      ALLERGIES:  is allergic to  benzoyl peroxide, doxylamine-phenylephrine , and hydrocodone.  MEDICATIONS:  Current Outpatient Medications  Medication Sig Dispense Refill   acetaminophen  (TYLENOL ) 500 MG tablet Take 1,000 mg by mouth every 6 (six) hours as needed for moderate pain or headache.     ALPRAZolam  (XANAX ) 0.5 MG tablet TAKE 1 TABLET BY MOUTH THREE TIMES DAILY AS NEEDED FOR ANXIETY 60 tablet 0   BIOTIN PO Take by mouth.     bismuth subsalicylate (PEPTO BISMOL) 262 MG/15ML suspension Take 30 mLs by mouth every 6 (six) hours as needed for indigestion or diarrhea or loose stools.     buPROPion  (WELLBUTRIN  XL) 150 MG 24 hr tablet Take 150 mg by mouth daily.     dexmethylphenidate (FOCALIN XR) 10 MG 24 hr capsule Take 10 mg by mouth daily.     escitalopram  (LEXAPRO ) 20 MG tablet Take 20 mg by mouth daily.     fluticasone (FLONASE) 50 MCG/ACT nasal spray Place 1 spray into both nostrils daily as needed for allergies or rhinitis.     ibuprofen  (ADVIL ) 200 MG tablet Take 400 mg by mouth every 6 (six) hours as needed for headache or moderate pain.     Norethindrone Acetate-Ethinyl Estrad-FE (LOESTRIN 24 FE) 1-20 MG-MCG(24) tablet Take 1 tablet by mouth daily. 90 tablet 4   omeprazole (PRILOSEC) 40 MG capsule Take 40 mg by mouth daily.     spironolactone (ALDACTONE) 50 MG tablet Take 50 mg by mouth 2 (two) times daily.     valACYclovir (VALTREX) 1000 MG tablet AT FIRST SIGN OF ONSET, TAKE 2 TABLETS BY MOUTH AND THEN 2 TABLETS 12 HOURS LATER     No current facility-administered medications for this visit.   Facility-Administered Medications Ordered in Other Visits  Medication Dose Route Frequency Provider Last Rate Last Admin   sodium chloride  flush (NS) 0.9 % injection 10 mL  10 mL Intravenous PRN Odean Potts, MD   10 mL at 03/18/20 1033    PHYSICAL EXAMINATION: ECOG PERFORMANCE STATUS: 1 - Symptomatic but completely ambulatory  Vitals:   03/28/23 0903  BP: 124/83  Pulse: (!) 120  Resp: 18  Temp: (!) 97.5 F  (36.4 C)  SpO2: 100%   Filed Weights   03/28/23 0903  Weight: 172 lb (78 kg)    Physical Exam   CHEST: No concerning findings. BREAST: No concerning findings.      (exam performed in the presence of a chaperone)  LABORATORY DATA:  I have reviewed the data as listed    Latest Ref Rng & Units 11/16/2021    9:22 AM 05/14/2021    9:19 AM 04/23/2021    8:49 AM  CMP  Glucose 70 - 99 mg/dL 890  93  80   BUN 6 - 20 mg/dL 20  10  12    Creatinine 0.44 - 1.00 mg/dL 9.26  9.15  9.13   Sodium 135 - 145 mmol/L 137  137  137   Potassium 3.5 - 5.1 mmol/L 4.0  4.1  4.3   Chloride 98 - 111 mmol/L 104  103  104   CO2 22 - 32 mmol/L 28  28  27    Calcium 8.9 - 10.3 mg/dL 9.7  9.4  9.4   Total Protein 6.5 - 8.1 g/dL 7.2  7.7  7.4   Total Bilirubin 0.3 - 1.2 mg/dL 0.3  0.3  0.3   Alkaline Phos 38 - 126 U/L 77  102  124   AST 15 - 41 U/L 22  20  18    ALT 0 - 44 U/L 31  31  26      Lab Results  Component Value Date   WBC 7.6 11/16/2021   HGB 12.9 11/16/2021   HCT 38.5 11/16/2021   MCV 82.6 11/16/2021   PLT 307 11/16/2021   NEUTROABS 5.4 11/16/2021    ASSESSMENT & PLAN:  Malignant neoplasm of upper-outer quadrant of left breast in female, estrogen receptor negative (HCC) Palpable left breast mass: 1.8 cm UOQ fibroadenoma in the right breast, left breast 3 masses 2 to 2:30 position: 1.7, 1.8, 1.4 cm.  Biopsy revealed IDC grade 3, ER 0%, PR 0%, Ki-67 80%, HER-2 -1+ T1c M0 stage IB   Treatment plan: 1.    Bilateral mastectomies 01/09/2020: Right mastectomy:PASH Left mastectomy: Multifocal IDC grade 3 2.2 cm largest, high-grade DCIS, margins negative, 0/4 sentinel lymph nodes, ER negative, PR negative, HER-2 negative, Ki-67 80% 2.  I recommended adjuvant chemotherapy with dose dense Adriamycin  and Cytoxan  pembrolizumab  followed by Taxol , carboplatin  and pembrolizumab  completed 08/28/2020 3.  Maintenance Pembrolizumab  completed  03/12/2021 ----------------------------------------------------------------------------------------------------------------------------------- Abdominal pain and muscle and joint aches and pains: 11/30/2020: Liver: 2.1 cm enhancement nonspecific, no definite metastatic disease.  Left iliac bone lytic and sclerotic lesion: Nonspecific, multiple thyroid  nodules. Bone lesions: 12/18/20 bone scan: No abnormal activity to correspond to the small sclerotic foci in the left pelvis seen last month Liver MRI 02/05/2021: Findings compatible with benign focal nodular hyperplasia.  6 to 45-month follow-up recommended ------------------------------------------------------------------------------------------------------------------------------ Breast cancer surveillance: 1.  Chest examination 03/28/2023: Benign 2. no role of mammograms because she had bilateral mastectomies.   Signatera: Not detected Peripheral neuropathy: Resolved 11/30/2021: MRI liver: Decreased conspicuity of the hepatic lesions suggestive of benign/indolent process 12/14/2021: CT CAP: No new metastatic disease.  Prominent left axillary lymph node stable, stable bilateral lung nodules, benign liver lesion.  Similar lesion left iliac bone and left acetabulum (benign), thyroid  nodules 12/14/2021: Bone scan: Benign   Return to clinic in 1 year for follow-up   No orders of the defined types were placed in this encounter.  The patient has a good understanding of the overall plan. she agrees with it. she will call with any problems that may develop before the next visit here. Total time spent: 30 mins including face to face time and time spent for planning, charting and co-ordination of care   Amanda K Avaline Stillson, MD 03/28/23

## 2023-03-28 NOTE — Progress Notes (Signed)
 Yearly renewal orders placed for Signatera.

## 2023-03-28 NOTE — Assessment & Plan Note (Signed)
 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  Biopsy revealed IDC grade 3, ER 0%, PR 0%, Ki-67 80%, HER-2 -1+ T1c M0 stage IB   Treatment plan: 1.    Bilateral mastectomies 01/09/2020: Right mastectomy:PASH Left mastectomy: Multifocal IDC grade 3 2.2 cm largest, high-grade DCIS, margins negative, 0/4 sentinel lymph nodes, ER negative, PR negative, HER-2 negative, Ki-67 80% 2.  I recommended adjuvant chemotherapy with dose dense Adriamycin  and Cytoxan  pembrolizumab  followed by Taxol , carboplatin  and pembrolizumab  completed 08/28/2020 3.  Maintenance Pembrolizumab  completed 03/12/2021 ----------------------------------------------------------------------------------------------------------------------------------- Abdominal pain and muscle and joint aches and pains: 11/30/2020: Liver: 2.1 cm enhancement nonspecific, no definite metastatic disease.  Left iliac bone lytic and sclerotic lesion: Nonspecific, multiple thyroid  nodules. Bone lesions: 12/18/20 bone scan: No abnormal activity to correspond to the small sclerotic foci in the left pelvis seen last month Liver MRI 02/05/2021: Findings compatible with benign focal nodular hyperplasia.  6 to 29-month follow-up recommended ------------------------------------------------------------------------------------------------------------------------------ Breast cancer surveillance: 1.  Chest examination 03/28/2023: Benign 2. no role of mammograms because she had bilateral mastectomies.   Signatera: Not detected Peripheral neuropathy: Resolved 11/30/2021: MRI liver: Decreased conspicuity of the hepatic lesions suggestive of benign/indolent process 12/14/2021: CT CAP: No new metastatic disease.  Prominent left axillary lymph node stable, stable bilateral lung nodules, benign liver lesion.  Similar lesion left iliac bone and left acetabulum (benign), thyroid   nodules 12/14/2021: Bone scan: Benign   Return to clinic in 1 year for follow-up

## 2023-04-05 ENCOUNTER — Other Ambulatory Visit: Payer: Self-pay | Admitting: Medical Genetics

## 2023-04-24 LAB — SIGNATERA ONLY (NATERA MANAGED)
SIGNATERA MTM READOUT: 0 MTM/ml
SIGNATERA TEST RESULT: NEGATIVE

## 2023-04-26 ENCOUNTER — Telehealth: Payer: Self-pay

## 2023-04-26 NOTE — Telephone Encounter (Signed)
Attempted to call pt regarding signatera results lvm for pt to return call back to receive results. Results was negative.

## 2023-06-14 ENCOUNTER — Other Ambulatory Visit (HOSPITAL_COMMUNITY): Payer: Self-pay | Attending: Medical Genetics

## 2023-10-28 LAB — SIGNATERA
SIGNATERA MTM READOUT: 0 MTM/ml
SIGNATERA TEST RESULT: NEGATIVE

## 2023-12-22 ENCOUNTER — Other Ambulatory Visit: Payer: Self-pay | Admitting: *Deleted

## 2023-12-22 DIAGNOSIS — Z006 Encounter for examination for normal comparison and control in clinical research program: Secondary | ICD-10-CM

## 2024-03-26 ENCOUNTER — Ambulatory Visit: Admitting: Cardiology

## 2024-03-28 ENCOUNTER — Inpatient Hospital Stay: Payer: BC Managed Care – PPO | Attending: Hematology and Oncology | Admitting: Hematology and Oncology

## 2024-03-28 VITALS — BP 128/85 | HR 105 | Temp 98.1°F | Resp 18 | Ht 61.0 in | Wt 163.6 lb

## 2024-03-28 DIAGNOSIS — Z9013 Acquired absence of bilateral breasts and nipples: Secondary | ICD-10-CM | POA: Insufficient documentation

## 2024-03-28 DIAGNOSIS — C50412 Malignant neoplasm of upper-outer quadrant of left female breast: Secondary | ICD-10-CM | POA: Diagnosis not present

## 2024-03-28 DIAGNOSIS — K769 Liver disease, unspecified: Secondary | ICD-10-CM | POA: Diagnosis not present

## 2024-03-28 DIAGNOSIS — Z171 Estrogen receptor negative status [ER-]: Secondary | ICD-10-CM | POA: Diagnosis not present

## 2024-03-28 DIAGNOSIS — Z9221 Personal history of antineoplastic chemotherapy: Secondary | ICD-10-CM | POA: Insufficient documentation

## 2024-03-28 DIAGNOSIS — Z853 Personal history of malignant neoplasm of breast: Secondary | ICD-10-CM | POA: Diagnosis present

## 2024-03-28 DIAGNOSIS — E041 Nontoxic single thyroid nodule: Secondary | ICD-10-CM | POA: Diagnosis not present

## 2024-03-28 NOTE — Progress Notes (Signed)
 "  Patient Care Team: Odean Potts, MD as PCP - General (Hematology and Oncology) Aron Shoulders, MD as Consulting Physician (General Surgery) Odean Potts, MD as Consulting Physician (Hematology and Oncology) Shannon Agent, MD as Consulting Physician (Radiation Oncology) Arelia Filippo, MD as Consulting Physician (Plastic Surgery)  DIAGNOSIS:  Encounter Diagnosis  Name Primary?   Malignant neoplasm of upper-outer quadrant of left breast in female, estrogen receptor negative (HCC) Yes    SUMMARY OF ONCOLOGIC HISTORY: Oncology History  Malignant neoplasm of upper-outer quadrant of left breast in female, estrogen receptor negative (HCC)  11/29/2019 Initial Diagnosis   Patient palpated an area of concern in the left breast. Diagnostic mammogram and US  showed a 1.8cm mass in the upper outer right breast representing a fibroadenoma, and in the left breast, three adjacent masses at the 2-2:30 position, 1.7cm, 0.8cm, and 1.4cm, with several cysts and no axillary adenopathy. Biopsy showed a benign fibroadenoma in the right breast, and in the left breast, IDC at the 2:30 position, grade 3, HER-2 negative (1+), ER+ 0%, PR+ 0%, Ki67 80%.    12/04/2019 Cancer Staging   Staging form: Breast, AJCC 8th Edition - Clinical: Stage IB (cT1c, cN0, cM0, G3, ER-, PR-, HER2-) - Signed by Odean Potts, MD on 12/04/2019   12/21/2019 Genetic Testing   Negative genetic testing:  No pathogenic variants detected on the [facility] Multi-Cancer Panel. A variant of uncertain significance (VUS) was detected in the CDK4 gene called c.415C>T (p.Arg139*). The report date is 12/21/2019.  The Multi-Cancer Panel offered by [facility] includes sequencing and/or deletion duplication testing of the following 85 genes: AIP, ALK, APC, ATM, AXIN2,BAP1,  BARD1, BLM, BMPR1A, BRCA1, BRCA2, BRIP1, CASR, CDC73, CDH1, CDK4, CDKN1B, CDKN1C, CDKN2A (p14ARF), CDKN2A (p16INK4a), CEBPA, CHEK2, CTNNA1, DICER1, DIS3L2, EGFR (c.2369C>T, p.Thr790Met  variant only), EPCAM (Deletion/duplication testing only), FH, FLCN, GATA2, GPC3, GREM1 (Promoter region deletion/duplication testing only), HOXB13 (c.251G>A, p.Gly84Glu), HRAS, KIT, MAX, MEN1, MET, MITF (c.952G>A, p.Glu318Lys variant only), MLH1, MSH2, MSH3, MSH6, MUTYH, NBN, NF1, NF2, NTHL1, PALB2, PDGFRA, PHOX2B, PMS2, POLD1, POLE, POT1, PRKAR1A, PTCH1, PTEN, RAD50, RAD51C, RAD51D, RB1, RECQL4, RET, RNF43, RUNX1, SDHAF2, SDHA (sequence changes only), SDHB, SDHC, SDHD, SMAD4, SMARCA4, SMARCB1, SMARCE1, STK11, SUFU, TERC, TERT, TMEM127, TP53, TSC1, TSC2, VHL, WRN and WT1.    01/09/2020 Surgery   Bilateral mastectomies with reconstruction [facility]):  Right breast: no evidence of malignancy Left breast: multifocal invasive ductal carcinoma, grade 3, largest 2.2cm, with high grade DCIS, clear margins, 4 left axillary lymph nodes negative for carcinoma.   03/18/2020 - 08/28/2020 Chemotherapy   Adriamycin , Cytoxan  Keytruda  followed by Taxol  carbo Keytruda    09/17/2020 - 04/23/2021 Chemotherapy   Patient is on Treatment Plan : BREAST Pembrolizumab  q21d        CHIEF COMPLIANT: Surveillance of breast cancer, complaining of subcutaneous nodules on the right abdominal wall that are painful  HISTORY OF PRESENT ILLNESS:  History of Present Illness Amanda Burns is a 44 year old female with stage IB triple-negative invasive ductal carcinoma of the left breast, currently in remission, who presents for evaluation of recurrent painful rib nodules.  She is nearly five years from diagnosis of multifocal, high-grade ER-/PR-/HER2- left breast invasive ductal carcinoma, treated with bilateral mastectomies, adjuvant dose-dense doxorubicin /cyclophosphamide  and paclitaxel /carboplatin  with pembrolizumab , and completed maintenance pembrolizumab  in December 2022. Surveillance imaging in October 2023 showed no evidence of recurrence, with stable benign-appearing left axillary lymph node, lung nodules, and right  liver lesion. She is scheduled for repeat Signatera ctDNA testing in February.  She describes  recurrent painful rib nodules with dull, throbbing, burning pain rated 5/10, worsened by position, without visible nodules or skin changes. Symptoms have been intermittent for years. The most recent and most severe episode lasted about one week and significantly limited her ability to work and get comfortable. There is no associated pruritus or nocturnal awakening. She has not tried topical therapies. Prior imaging of the area has not shown concerning findings.  She reports a persistent dry cough and frequent throat clearing that have not improved with prior ENT evaluation and treatment for post-nasal drip. She also has resting tachycardia (97-105 bpm) and an abnormal echocardiogram with left ventricular hypokinesis concerning for coronary artery disease. She is awaiting heart catheterization. She has intermittent dyspnea but no chest pain or syncope.      ALLERGIES:  is allergic to doxylamine-phenylephrine  and hydrocodone.  MEDICATIONS:  Current Outpatient Medications  Medication Sig Dispense Refill   acetaminophen  (TYLENOL ) 500 MG tablet Take 1,000 mg by mouth every 6 (six) hours as needed for moderate pain or headache.     ALPRAZolam  (XANAX ) 0.5 MG tablet TAKE 1 TABLET BY MOUTH THREE TIMES DAILY AS NEEDED FOR ANXIETY 60 tablet 0   BIOTIN PO Take by mouth.     bismuth subsalicylate (PEPTO BISMOL) 262 MG/15ML suspension Take 30 mLs by mouth every 6 (six) hours as needed for indigestion or diarrhea or loose stools.     buPROPion  (WELLBUTRIN  XL) 150 MG 24 hr tablet Take 150 mg by mouth daily.     dexmethylphenidate (FOCALIN XR) 10 MG 24 hr capsule Take 10 mg by mouth daily.     escitalopram  (LEXAPRO ) 20 MG tablet Take 20 mg by mouth daily.     fluticasone (FLONASE) 50 MCG/ACT nasal spray Place 1 spray into both nostrils daily as needed for allergies or rhinitis.     ibuprofen  (ADVIL ) 200 MG tablet Take  400 mg by mouth every 6 (six) hours as needed for headache or moderate pain.     Norethindrone Acetate-Ethinyl Estrad-FE (LOESTRIN 24 FE) 1-20 MG-MCG(24) tablet Take 1 tablet by mouth daily. 90 tablet 4   omeprazole (PRILOSEC) 40 MG capsule Take 40 mg by mouth daily.     spironolactone (ALDACTONE) 50 MG tablet Take 50 mg by mouth 2 (two) times daily.     valACYclovir (VALTREX) 1000 MG tablet AT FIRST SIGN OF ONSET, TAKE 2 TABLETS BY MOUTH AND THEN 2 TABLETS 12 HOURS LATER     No current facility-administered medications for this visit.   Facility-Administered Medications Ordered in Other Visits  Medication Dose Route Frequency Provider Last Rate Last Admin   sodium chloride  flush (NS) 0.9 % injection 10 mL  10 mL Intravenous PRN Odean Potts, MD   10 mL at 03/18/20 1033    PHYSICAL EXAMINATION: ECOG PERFORMANCE STATUS: 1 - Symptomatic but completely ambulatory  Vitals:   03/28/24 1210  BP: 128/85  Pulse: (!) 105  Resp: 18  Temp: 98.1 F (36.7 C)  SpO2: 98%   Filed Weights   03/28/24 1210  Weight: 163 lb 9.6 oz (74.2 kg)    Physical Exam VITALS: P- 105  (exam performed in the presence of a chaperone)  LABORATORY DATA:  I have reviewed the data as listed    Latest Ref Rng & Units 11/16/2021    9:22 AM 05/14/2021    9:19 AM 04/23/2021    8:49 AM  CMP  Glucose 70 - 99 mg/dL 890  93  80   BUN 6 - 20 mg/dL  20  10  12    Creatinine 0.44 - 1.00 mg/dL 9.26  9.15  9.13   Sodium 135 - 145 mmol/L 137  137  137   Potassium 3.5 - 5.1 mmol/L 4.0  4.1  4.3   Chloride 98 - 111 mmol/L 104  103  104   CO2 22 - 32 mmol/L 28  28  27    Calcium 8.9 - 10.3 mg/dL 9.7  9.4  9.4   Total Protein 6.5 - 8.1 g/dL 7.2  7.7  7.4   Total Bilirubin 0.3 - 1.2 mg/dL 0.3  0.3  0.3   Alkaline Phos 38 - 126 U/L 77  102  124   AST 15 - 41 U/L 22  20  18    ALT 0 - 44 U/L 31  31  26      Lab Results  Component Value Date   WBC 7.6 11/16/2021   HGB 12.9 11/16/2021   HCT 38.5 11/16/2021   MCV 82.6  11/16/2021   PLT 307 11/16/2021   NEUTROABS 5.4 11/16/2021    ASSESSMENT & PLAN:  Malignant neoplasm of upper-outer quadrant of left breast in female, estrogen receptor negative (HCC) Palpable left breast mass: 1.8 cm UOQ fibroadenoma in the right breast, left breast 3 masses 2 to 2:30 position: 1.7, 1.8, 1.4 cm.  Biopsy revealed IDC grade 3, ER 0%, PR 0%, Ki-67 80%, HER-2 -1+ T1c M0 stage IB   Treatment plan: 1.    Bilateral mastectomies 01/09/2020: Right mastectomy:PASH Left mastectomy: Multifocal IDC grade 3 2.2 cm largest, high-grade DCIS, margins negative, 0/4 sentinel lymph nodes, ER negative, PR negative, HER-2 negative, Ki-67 80% 2.  I recommended adjuvant chemotherapy with dose dense Adriamycin  and Cytoxan  pembrolizumab  followed by Taxol , carboplatin  and pembrolizumab  completed 08/28/2020 3.  Maintenance Pembrolizumab  completed 03/12/2021 ----------------------------------------------------------------------------------------------------------------------------------- Abdominal pain and muscle and joint aches and pains: 11/30/2020: Liver: 2.1 cm enhancement nonspecific, no definite metastatic disease.  Left iliac bone lytic and sclerotic lesion: Nonspecific, multiple thyroid  nodules. Bone lesions: 12/18/20 bone scan: No abnormal activity to correspond to the small sclerotic foci in the left pelvis seen last month Liver MRI 02/05/2021: Findings compatible with benign focal nodular hyperplasia.  6 to 46-month follow-up recommended ------------------------------------------------------------------------------------------------------------------------------ Breast cancer surveillance: 1.  Chest examination 03/28/2024: Benign 2. no role of mammograms because she had bilateral mastectomies.   Signatera: Not detected Peripheral neuropathy: Resolved 11/30/2021: MRI liver: Decreased conspicuity of the hepatic lesions suggestive of benign/indolent process 12/14/2021: CT CAP: No new metastatic  disease.  Prominent left axillary lymph node stable, stable bilateral lung nodules, benign liver lesion.  Similar lesion left iliac bone and left acetabulum (benign), thyroid  nodules 12/14/2021: Bone scan: Benign    Subcutaneous nodules on the tenderness and abdominal discomfort: We will obtain a CT chest abdomen pelvis.  Will do this noncontrast because she is likely going to have a heart catheterization in the next few days.  I do not want her to have excessive nephrotoxic dye to be given.    Telephone follow-up after scans to discuss results Otherwise we will see her back in 1 year. ------------------------------------- Assessment and Plan Assessment & Plan Malignant neoplasm of upper-outer quadrant of left breast, estrogen receptor negative, in remission No evidence of disease on imaging and ctDNA testing.  - Provided reassurance regarding current remission status and explained that imaging is performed as needed for symptoms or concern. - Ordered CT chest, abdomen, and pelvis (noncontrast) for surveillance and evaluation of occult malignancy or other pathology, given her history  and current symptoms. - Continue annual surveillance visits with medical oncology and breast surgery. - Continue Signatera ctDNA monitoring as scheduled. - Continue routine clinical exams and symptom monitoring. - Monitor for late effects of chemotherapy, including cardiac toxicity and neuropathy. - Discussed potential for discontinuation of oncology follow-up after 5 years if sustained remission and absence of new symptoms.  Paniculitis Recurrent, positional, tender nodules in rib area consistent with paniculitis. Symptoms intermittent with recent exacerbation. Diagnosis clinical, supported by history and prior imaging showing no concerning findings. Differential includes local inflammation of adipose tissue. - Recommended trial of topical diclofenac (Voltaren gel) for local pain and inflammation. - Discussed  NSAIDs as an alternative for symptom relief if needed. - Ordered CT chest, abdomen, and pelvis (noncontrast) to further evaluate for underlying pathology and provide reassurance. - Provided education regarding the benign, waxing and waning course of paniculitis.      No orders of the defined types were placed in this encounter.  The patient has a good understanding of the overall plan. she agrees with it. she will call with any problems that may develop before the next visit here.  I personally spent a total of 30 minutes in the care of the patient today including preparing to see the patient, getting/reviewing separately obtained history, performing a medically appropriate exam/evaluation, counseling and educating, placing orders, referring and communicating with other health care professionals, documenting clinical information in the EHR, independently interpreting results, communicating results, and coordinating care.   Viinay K Tristain Daily, MD 03/28/24    "

## 2024-03-28 NOTE — Assessment & Plan Note (Signed)
 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  Biopsy revealed IDC grade 3, ER 0%, PR 0%, Ki-67 80%, HER-2 -1+ T1c M0 stage IB   Treatment plan: 1.    Bilateral mastectomies 01/09/2020: Right mastectomy:PASH Left mastectomy: Multifocal IDC grade 3 2.2 cm largest, high-grade DCIS, margins negative, 0/4 sentinel lymph nodes, ER negative, PR negative, HER-2 negative, Ki-67 80% 2.  I recommended adjuvant chemotherapy with dose dense Adriamycin  and Cytoxan  pembrolizumab  followed by Taxol , carboplatin  and pembrolizumab  completed 08/28/2020 3.  Maintenance Pembrolizumab  completed 03/12/2021 ----------------------------------------------------------------------------------------------------------------------------------- Abdominal pain and muscle and joint aches and pains: 11/30/2020: Liver: 2.1 cm enhancement nonspecific, no definite metastatic disease.  Left iliac bone lytic and sclerotic lesion: Nonspecific, multiple thyroid  nodules. Bone lesions: 12/18/20 bone scan: No abnormal activity to correspond to the small sclerotic foci in the left pelvis seen last month Liver MRI 02/05/2021: Findings compatible with benign focal nodular hyperplasia.  6 to 73-month follow-up recommended ------------------------------------------------------------------------------------------------------------------------------ Breast cancer surveillance: 1.  Chest examination 03/28/2024: Benign 2. no role of mammograms because she had bilateral mastectomies.   Signatera: Not detected Peripheral neuropathy: Resolved 11/30/2021: MRI liver: Decreased conspicuity of the hepatic lesions suggestive of benign/indolent process 12/14/2021: CT CAP: No new metastatic disease.  Prominent left axillary lymph node stable, stable bilateral lung nodules, benign liver lesion.  Similar lesion left iliac bone and left acetabulum (benign), thyroid   nodules 12/14/2021: Bone scan: Benign    Recommend patient: Repeat scans Telephone follow-up after scans to discuss results

## 2024-04-12 ENCOUNTER — Ambulatory Visit (HOSPITAL_COMMUNITY)
Admission: RE | Admit: 2024-04-12 | Discharge: 2024-04-12 | Disposition: A | Source: Ambulatory Visit | Attending: Hematology and Oncology

## 2024-04-12 DIAGNOSIS — C50412 Malignant neoplasm of upper-outer quadrant of left female breast: Secondary | ICD-10-CM | POA: Insufficient documentation

## 2024-04-12 DIAGNOSIS — Z171 Estrogen receptor negative status [ER-]: Secondary | ICD-10-CM | POA: Insufficient documentation

## 2024-04-12 MED ORDER — HEPARIN SOD (PORK) LOCK FLUSH 100 UNIT/ML IV SOLN
INTRAVENOUS | Status: AC
  Filled 2024-04-12: qty 5

## 2024-04-16 ENCOUNTER — Other Ambulatory Visit: Payer: Self-pay | Admitting: *Deleted

## 2024-04-16 DIAGNOSIS — Z171 Estrogen receptor negative status [ER-]: Secondary | ICD-10-CM

## 2024-04-16 NOTE — Progress Notes (Signed)
 Signatera renewal orders placed.

## 2024-04-25 ENCOUNTER — Inpatient Hospital Stay: Attending: Hematology and Oncology | Admitting: Hematology and Oncology

## 2025-03-31 ENCOUNTER — Inpatient Hospital Stay: Attending: Hematology and Oncology | Admitting: Hematology and Oncology
